# Patient Record
Sex: Female | Born: 1970 | Race: Black or African American | Hispanic: No | Smoking: Never smoker
Health system: Southern US, Community
[De-identification: ages and names within clinical notes are randomized; demographics above are authoritative.]

## PROBLEM LIST (undated history)

## (undated) DIAGNOSIS — E785 Hyperlipidemia, unspecified: Secondary | ICD-10-CM

## (undated) DIAGNOSIS — K219 Gastro-esophageal reflux disease without esophagitis: Secondary | ICD-10-CM

## (undated) DIAGNOSIS — T7840XA Allergy, unspecified, initial encounter: Secondary | ICD-10-CM

## (undated) DIAGNOSIS — E119 Type 2 diabetes mellitus without complications: Secondary | ICD-10-CM

## (undated) HISTORY — DX: Gastro-esophageal reflux disease without esophagitis: K21.9

## (undated) HISTORY — DX: Type 2 diabetes mellitus without complications: E11.9

## (undated) HISTORY — DX: Hyperlipidemia, unspecified: E78.5

## (undated) HISTORY — DX: Allergy, unspecified, initial encounter: T78.40XA

---

## 2014-05-19 ENCOUNTER — Inpatient Hospital Stay (HOSPITAL_COMMUNITY)
Admission: EM | Admit: 2014-05-19 | Discharge: 2014-05-25 | DRG: 638 | Disposition: A | Discharge status: Home / Self Care | Payer: Self-pay | Attending: Internal Medicine | Admitting: Internal Medicine

## 2014-05-19 ENCOUNTER — Encounter (HOSPITAL_COMMUNITY): Payer: Self-pay | Admitting: Emergency Medicine

## 2014-05-19 DIAGNOSIS — E1310 Other specified diabetes mellitus with ketoacidosis without coma: Principal | ICD-10-CM | POA: Diagnosis present

## 2014-05-19 DIAGNOSIS — Z833 Family history of diabetes mellitus: Secondary | ICD-10-CM

## 2014-05-19 DIAGNOSIS — E1165 Type 2 diabetes mellitus with hyperglycemia: Secondary | ICD-10-CM

## 2014-05-19 DIAGNOSIS — E860 Dehydration: Secondary | ICD-10-CM | POA: Diagnosis present

## 2014-05-19 DIAGNOSIS — A599 Trichomoniasis, unspecified: Secondary | ICD-10-CM | POA: Diagnosis present

## 2014-05-19 DIAGNOSIS — E876 Hypokalemia: Secondary | ICD-10-CM | POA: Diagnosis present

## 2014-05-19 DIAGNOSIS — IMO0002 Reserved for concepts with insufficient information to code with codable children: Secondary | ICD-10-CM

## 2014-05-19 DIAGNOSIS — E1110 Type 2 diabetes mellitus with ketoacidosis without coma: Secondary | ICD-10-CM | POA: Diagnosis present

## 2014-05-19 DIAGNOSIS — E871 Hypo-osmolality and hyponatremia: Secondary | ICD-10-CM | POA: Diagnosis present

## 2014-05-19 DIAGNOSIS — E785 Hyperlipidemia, unspecified: Secondary | ICD-10-CM | POA: Diagnosis present

## 2014-05-19 DIAGNOSIS — E0810 Diabetes mellitus due to underlying condition with ketoacidosis without coma: Secondary | ICD-10-CM

## 2014-05-19 DIAGNOSIS — Z794 Long term (current) use of insulin: Secondary | ICD-10-CM

## 2014-05-19 DIAGNOSIS — N760 Acute vaginitis: Secondary | ICD-10-CM | POA: Diagnosis present

## 2014-05-19 DIAGNOSIS — E119 Type 2 diabetes mellitus without complications: Secondary | ICD-10-CM

## 2014-05-19 LAB — BASIC METABOLIC PANEL
ANION GAP: 20 — AB (ref 5–15)
Anion gap: 28 — ABNORMAL HIGH (ref 5–15)
Anion gap: 25 — ABNORMAL HIGH (ref 5–15)
BUN: 17 mg/dL (ref 6–23)
BUN: 13 mg/dL (ref 6–23)
BUN: 11 mg/dL (ref 6–23)
CALCIUM: 8 mg/dL — AB (ref 8.4–10.5)
CALCIUM: 8.2 mg/dL — AB (ref 8.4–10.5)
CO2: 10 mEq/L — CL (ref 19–32)
CO2: 11 mEq/L — ABNORMAL LOW (ref 19–32)
CO2: 13 mEq/L — ABNORMAL LOW (ref 19–32)
Calcium: 9 mg/dL (ref 8.4–10.5)
Chloride: 90 mEq/L — ABNORMAL LOW (ref 96–112)
Chloride: 102 mEq/L (ref 96–112)
Chloride: 103 mEq/L (ref 96–112)
Creatinine, Ser: 0.8 mg/dL (ref 0.50–1.10)
Creatinine, Ser: 0.7 mg/dL (ref 0.50–1.10)
Creatinine, Ser: 0.7 mg/dL (ref 0.50–1.10)
GFR calc Af Amer: >90 mL/min (ref >90)
GFR calc Af Amer: >90 mL/min (ref >90)
GFR calc Af Amer: >90 mL/min (ref >90)
GFR calc non Af Amer: 90 mL/min — ABNORMAL LOW (ref >90)
GFR calc non Af Amer: >90 mL/min (ref >90)
GLUCOSE: 253 mg/dL — AB (ref 70–99)
GLUCOSE: 163 mg/dL — AB (ref 70–99)
Glucose, Bld: 444 mg/dL — ABNORMAL HIGH (ref 70–99)
POTASSIUM: 2.6 meq/L — AB (ref 3.7–5.3)
POTASSIUM: 2.8 meq/L — AB (ref 3.7–5.3)
Potassium: 2.9 mEq/L — CL (ref 3.7–5.3)
SODIUM: 138 meq/L (ref 137–147)
SODIUM: 136 meq/L — AB (ref 137–147)
Sodium: 128 mEq/L — ABNORMAL LOW (ref 137–147)

## 2014-05-19 LAB — WET PREP, GENITAL
Clue Cells Wet Prep HPF POC: NONE SEEN
Yeast Wet Prep HPF POC: NONE SEEN

## 2014-05-19 LAB — URINALYSIS, ROUTINE W REFLEX MICROSCOPIC
BILIRUBIN URINE: NEGATIVE
Ketones, ur: >80 mg/dL — AB
Leukocytes, UA: NEGATIVE
Nitrite: NEGATIVE
PH: 5.5 (ref 5.0–8.0)
Protein, ur: 30 mg/dL — AB
SPECIFIC GRAVITY, URINE: 1.024 (ref 1.005–1.030)
Urobilinogen, UA: 0.2 mg/dL (ref 0.0–1.0)

## 2014-05-19 LAB — URINE MICROSCOPIC-ADD ON

## 2014-05-19 LAB — CBC WITH DIFFERENTIAL/PLATELET
Basophils Absolute: 0 10*3/uL (ref 0.0–0.1)
Basophils Relative: 0 % (ref 0–1)
Eosinophils Absolute: 0 10*3/uL (ref 0.0–0.7)
Eosinophils Relative: 0 % (ref 0–5)
HCT: 37.7 % (ref 36.0–46.0)
Hemoglobin: 13.1 g/dL (ref 12.0–15.0)
Lymphocytes Relative: 27 % (ref 12–46)
Lymphs Abs: 2.3 10*3/uL (ref 0.7–4.0)
MCH: 27.6 pg (ref 26.0–34.0)
MCHC: 34.7 g/dL (ref 30.0–36.0)
MCV: 79.5 fL (ref 78.0–100.0)
Monocytes Absolute: 0.7 10*3/uL (ref 0.1–1.0)
Monocytes Relative: 8 % (ref 3–12)
Neutro Abs: 5.4 10*3/uL (ref 1.7–7.7)
Neutrophils Relative %: 65 % (ref 43–77)
Platelets: 269 10*3/uL (ref 150–400)
RBC: 4.74 MIL/uL (ref 3.87–5.11)
RDW: 13.4 % (ref 11.5–15.5)
WBC: 8.4 10*3/uL (ref 4.0–10.5)

## 2014-05-19 LAB — CBG MONITORING, ED
GLUCOSE-CAPILLARY: 290 mg/dL — AB (ref 70–99)
GLUCOSE-CAPILLARY: 166 mg/dL — AB (ref 70–99)
GLUCOSE-CAPILLARY: 160 mg/dL — AB (ref 70–99)
Glucose-Capillary: 399 mg/dL — ABNORMAL HIGH (ref 70–99)
Glucose-Capillary: 380 mg/dL — ABNORMAL HIGH (ref 70–99)
Glucose-Capillary: 365 mg/dL — ABNORMAL HIGH (ref 70–99)
Glucose-Capillary: 296 mg/dL — ABNORMAL HIGH (ref 70–99)
Glucose-Capillary: 253 mg/dL — ABNORMAL HIGH (ref 70–99)
Glucose-Capillary: 147 mg/dL — ABNORMAL HIGH (ref 70–99)

## 2014-05-19 LAB — I-STAT TROPONIN, ED: Troponin i, poc: 0 ng/mL (ref 0.00–0.08)

## 2014-05-19 LAB — GLUCOSE, CAPILLARY
GLUCOSE-CAPILLARY: 143 mg/dL — AB (ref 70–99)
Glucose-Capillary: 166 mg/dL — ABNORMAL HIGH (ref 70–99)

## 2014-05-19 LAB — POC URINE PREG, ED: Preg Test, Ur: NEGATIVE

## 2014-05-19 LAB — I-STAT BETA HCG BLOOD, ED (MC, WL, AP ONLY)

## 2014-05-19 MED ORDER — ACETAMINOPHEN 650 MG RE SUPP: 650.0000 mg | Freq: Four times a day (QID) | RECTAL | Status: DC | PRN

## 2014-05-19 MED ORDER — ONDANSETRON HCL 4 MG PO TABS: 4.0000 mg | ORAL_TABLET | Freq: Four times a day (QID) | ORAL | Status: DC | PRN

## 2014-05-19 MED ORDER — SODIUM CHLORIDE 0.9 % IV SOLN
INTRAVENOUS | Status: DC
  Administered 2014-05-19: 3.1 [IU]/h via INTRAVENOUS
  Filled 2014-05-19: qty 2.5

## 2014-05-19 MED ORDER — POTASSIUM CHLORIDE 10 MEQ/100ML IV SOLN
10.0000 meq | INTRAVENOUS | Status: AC
  Administered 2014-05-19 – 2014-05-20 (×4): 10 meq via INTRAVENOUS
  Filled 2014-05-19 (×4): qty 100

## 2014-05-19 MED ORDER — SODIUM CHLORIDE 0.9 % IV SOLN
INTRAVENOUS | Status: DC
  Administered 2014-05-19: 17:00:00 via INTRAVENOUS

## 2014-05-19 MED ORDER — ACETAMINOPHEN 325 MG PO TABS
650.0000 mg | ORAL_TABLET | Freq: Four times a day (QID) | ORAL | Status: DC | PRN
  Filled 2014-05-19: qty 2

## 2014-05-19 MED ORDER — INSULIN REGULAR HUMAN 100 UNIT/ML IJ SOLN
INTRAMUSCULAR | Status: DC
  Administered 2014-05-19: 4.7 [IU]/h via INTRAVENOUS
  Administered 2014-05-20: 2.1 [IU]/h via INTRAVENOUS
  Administered 2014-05-20: 3.9 [IU]/h via INTRAVENOUS
  Administered 2014-05-20: 1.5 [IU]/h via INTRAVENOUS
  Administered 2014-05-20: 1.9 [IU]/h via INTRAVENOUS
  Administered 2014-05-20: 3.9 [IU]/h via INTRAVENOUS
  Administered 2014-05-20: 2.4 [IU]/h via INTRAVENOUS
  Administered 2014-05-20: 0.4 [IU]/h via INTRAVENOUS
  Administered 2014-05-20: 3.2 [IU]/h via INTRAVENOUS
  Administered 2014-05-20: 1.7 [IU]/h via INTRAVENOUS
  Filled 2014-05-19: qty 2.5

## 2014-05-19 MED ORDER — POTASSIUM CHLORIDE CRYS ER 20 MEQ PO TBCR
60.0000 meq | EXTENDED_RELEASE_TABLET | ORAL | Status: AC
  Administered 2014-05-19 (×2): 60 meq via ORAL
  Filled 2014-05-19 (×2): qty 3

## 2014-05-19 MED ORDER — POTASSIUM CHLORIDE 10 MEQ/100ML IV SOLN
10.0000 meq | INTRAVENOUS | Status: AC
  Administered 2014-05-19 (×2): 10 meq via INTRAVENOUS
  Filled 2014-05-19 (×3): qty 100

## 2014-05-19 MED ORDER — ENOXAPARIN SODIUM 40 MG/0.4ML ~~LOC~~ SOLN
40.0000 mg | SUBCUTANEOUS | Status: DC
  Administered 2014-05-22 – 2014-05-25 (×4): 40 mg via SUBCUTANEOUS
  Filled 2014-05-19 (×7): qty 0.4

## 2014-05-19 MED ORDER — DEXTROSE 50 % IV SOLN: 25.0000 mL | INTRAVENOUS | Status: DC | PRN

## 2014-05-19 MED ORDER — SODIUM CHLORIDE 0.9 % IV BOLUS (SEPSIS)
1000.0000 mL | Freq: Once | INTRAVENOUS | Status: AC
  Administered 2014-05-19: 1000 mL via INTRAVENOUS

## 2014-05-19 MED ORDER — ONDANSETRON HCL 4 MG/2ML IJ SOLN: 4.0000 mg | Freq: Four times a day (QID) | INTRAMUSCULAR | Status: DC | PRN

## 2014-05-19 MED ORDER — SODIUM CHLORIDE 0.9 % IV SOLN: INTRAVENOUS | Status: DC

## 2014-05-19 MED ORDER — SODIUM CHLORIDE 0.9 % IV SOLN: INTRAVENOUS | Status: AC

## 2014-05-19 MED ORDER — METRONIDAZOLE 500 MG PO TABS
2000.0000 mg | ORAL_TABLET | Freq: Once | ORAL | Status: DC
  Filled 2014-05-19: qty 4

## 2014-05-19 MED ORDER — DEXTROSE-NACL 5-0.45 % IV SOLN
INTRAVENOUS | Status: DC
  Administered 2014-05-19 – 2014-05-21 (×3): via INTRAVENOUS

## 2014-05-19 MED ORDER — DEXTROSE-NACL 5-0.45 % IV SOLN: INTRAVENOUS | Status: DC

## 2014-05-19 MED ORDER — PANTOPRAZOLE SODIUM 40 MG IV SOLR
40.0000 mg | INTRAVENOUS | Status: DC
  Administered 2014-05-19 – 2014-05-20 (×2): 40 mg via INTRAVENOUS
  Filled 2014-05-19 (×3): qty 40

## 2014-05-19 NOTE — ED Notes (Signed)
Received call from Barbaraann Boys RN, ready for patient at this time.

## 2014-05-19 NOTE — ED Provider Notes (Signed)
Medical screening examination/treatment/procedure(s) were conducted as a shared visit with non-physician practitioner(s) and myself.  I personally evaluated the patient during the encounter.   EKG Interpretation   Date/Time:  Wednesday May 19 2014 12:12:31 EDT Ventricular Rate:  85 PR Interval:  172 QRS Duration: 77 QT Interval:  355 QTC Calculation: 422 R Axis:   35 Text Interpretation:  Sinus rhythm t wave inversion in Lead III No  previous ECGs available Confirmed by Christy Gentles  MD, Elenore Rota ([id number]) on  05/19/2014 12:26:50 PM      CRITICAL CARE Performed by: Sharyon Cable Total critical care time: 32 Critical care time was exclusive of separately billable procedures and treating other patients. Critical care was necessary to treat or prevent imminent or life-threatening deterioration. Critical care was time spent personally by me on the following activities: development of treatment plan with patient and/or surrogate as well as nursing, discussions with consultants, evaluation of patient's response to treatment, examination of patient, obtaining history from patient or surrogate, ordering and performing treatments and interventions, ordering and review of laboratory studies, ordering and review of radiographic studies, pulse oximetry and re-evaluation of patient's condition.  Pt found to be in DKA Anion gap >28 Pt smells ketotic  IV insulin ordered Pt mildly hypotensive but this improved with IV fluids  Sharyon Cable, MD 05/19/14 [phone]

## 2014-05-19 NOTE — ED Notes (Signed)
Dr. Broadus John Paged regarding critical results of Potassium 2.6.

## 2014-05-19 NOTE — ED Notes (Addendum)
Critical Potassium - 2.8 from lab

## 2014-05-19 NOTE — ED Notes (Signed)
Pt reports frequent urination, excessive thirst and vaginal irritation. No acute distress noted.

## 2014-05-19 NOTE — Progress Notes (Signed)
  CARE MANAGEMENT ED NOTE 05/19/2014  Patient:  Krista Campbell, Krista Campbell   Account Number:  [id number]  Date Initiated:  05/19/2014  Documentation initiated by:  Edwyna Shell  Subjective/Objective Assessment:   43 yo female presenting to the ED with c/o frequent urination     Subjective/Objective Assessment Detail:     Action/Plan:   Patient has follow up appointment on Oct. 9th, Friday at 9:00am   Action/Plan Detail:   Anticipated DC Date:       Status Recommendation to Physician:   Result of Recommendation:  Agreed    [place]  CM consult  PCP issues  Follow-up appt scheduled    Choice offered to / List presented to:            Status of service:  Completed, signed off  ED Comments:   ED Comments Detail:  This CM was consulted to assist with PCP needs. This CM spoke with the patient and she stated that she does not have insurance and has not had a PCP "in a long time". She stated that she currently is not on any medications. This Cm spoke with the patient regarding the ED PA's concern regarding new onset DM. This CM spoke with the patient regarding the importance of follow up care for mediation prescription and follow up medical care for the DM, as medications may have to be adjusted initially. The patient verbalized understanding. This CM then contacted the [facility] and spoke with [facility] and was able to establish an appointment this Friday, October 9th at [place] information was communicated to the [place], staff nurse and the patient. The patient was informed to arrive at 8:15 for new patient check in. The patient verbalized understanding and was provided with a [facility] pamphlet with the appointment information written on it. The patient had no other questions or concerns.

## 2014-05-19 NOTE — ED Provider Notes (Signed)
Patient seen/examined in the Emergency Department in conjunction with Midlevel Provider [facility] Patient reports polyuria, found to be in new onset DKA Exam : awake/alert, smells ketotic and appears dehydrated Plan: admit for DKA   Sharyon Cable, MD 05/19/14 1354

## 2014-05-19 NOTE — ED Notes (Signed)
Patient declining antibiotics in pill form, EDPA made aware.

## 2014-05-19 NOTE — H&P (Signed)
[facility] History and Physical  Krista Campbell:[id number] DOB: [birth year] DOA: 05/19/2014  Referring physician: EDP PCP: No PCP Per Patient   Chief Complaint: polyuria, polydipsia  HPI: Krista Campbell is a 43 y.o. female with no significant PMH presents to the ER with the above complaints. She was in her usual state of health till about 2 weeks ago when she noticed, polyuria, polydipsia, and some polyphagia too. In addition also noticed heartburn and some vaginal discharge which she attributed to polyuria and toilet paper. In ER noted to have Hyperglycemia, no H/o DM, anion gap metabolic acidosis, hypokalemia  Review of Systems: positives bolded Constitutional:  No weight loss, night sweats, Fevers, chills, fatigue.  HEENT:  No headaches, Difficulty swallowing,Tooth/dental problems,Sore throat,  No sneezing, itching, ear ache, nasal congestion, post nasal drip,  Cardio-vascular:  No chest pain, Orthopnea, PND, swelling in lower extremities, anasarca, dizziness, palpitations  GI:   heartburn, indigestion, abdominal pain, nausea, vomiting, diarrhea, change in bowel habits, loss of appetite  Resp:  No shortness of breath with exertion or at rest. No excess mucus, no productive cough, No non-productive cough, No coughing up of blood.No change in color of mucus.No wheezing.No chest wall deformity  Skin:  no rash or lesions.  GU:  no dysuria, change in color of urine, no urgency or frequency. No flank pain.  Musculoskeletal:  No joint pain or swelling. No decreased range of motion. No back pain.  Psych:  No change in mood or affect. No depression or anxiety. No memory loss.   History reviewed. No pertinent past medical history. History reviewed. No pertinent past surgical history. Social History:  reports that she does not drink alcohol or use illicit drugs. Her tobacco history is not on file.  No Known Allergies  History reviewed. No pertinent family history.     Prior to Admission medications   Medication Sig Start Date End Date Taking? Authorizing Provider  hydrocortisone cream 0.5 % Apply 1 application topically as needed for itching.   Yes Historical Provider, MD   Physical Exam: Filed Vitals:   05/19/14 1345 05/19/14 1430 05/19/14 1513 05/19/14 1520  BP: 115/67 119/57 95/56 125/70  Pulse: 87 87 91   Temp:      TempSrc:      Resp: 19 27 22    SpO2: 100% 100% 100% 100%    Wt Readings from Last 3 Encounters:  No data found for Wt    General:  Appears calm and comfortable, no distress Eyes: PERRL, normal lids, irises & conjunctiva ENT: grossly normal lips & tongue Neck: no LAD, masses or thyromegaly Cardiovascular: RRR, no m/r/g. No LE edema. Respiratory: CTA bilaterally, no w/r/r. Normal respiratory effort. Abdomen: soft, nt, nd, BS present Skin: no rash or induration seen on limited exam Musculoskeletal: grossly normal tone BUE/BLE Psychiatric: grossly normal mood and affect, speech fluent and appropriate Neurologic: grossly non-focal.          Labs on Admission:  Basic Metabolic Panel:  Recent Labs Lab 05/19/14 1155  NA 128*  K 2.9*  CL 90*  CO2 10*  GLUCOSE 444*  BUN 17  CREATININE 0.80  CALCIUM 9.0   Liver Function Tests: No results found for this basename: AST, ALT, ALKPHOS, BILITOT, PROT, ALBUMIN,  in the last 168 hours No results found for this basename: LIPASE, AMYLASE,  in the last 168 hours No results found for this basename: AMMONIA,  in the last 168 hours CBC:  Recent Labs Lab 05/19/14 1155  WBC  8.4  NEUTROABS 5.4  HGB 13.1  HCT 37.7  MCV 79.5  PLT 269   Cardiac Enzymes: No results found for this basename: CKTOTAL, CKMB, CKMBINDEX, TROPONINI,  in the last 168 hours  BNP (last 3 results) No results found for this basename: PROBNP,  in the last 8760 hours CBG:  Recent Labs Lab 05/19/14 0944 05/19/14 1324 05/19/14 1418  GLUCAP 399* 380* 365*    Radiological Exams on Admission: No  results found.  EKG: Independently reviewed. NSR, TWI in lead 3  Assessment/Plan    DKA (diabetic ketoacidoses) -new DM -IVF, IV insulin per Glucomander -Bmet Q4 -transition to Insulin 70/30 when gap closes   New DM -check hbaic, as above -DM coordinator consult, Dietician consult   Hyponatremia -due to dehydration and pseudohyponatremia from hyperglycemia    Hypokalemia -replace   Trichomoniasis vaginitis - given metronidazole x1, unfortunately didn't get a chance to discuss this with pt as these results just seen after admission, sexual partner will need Rx too  Code Status: Full Code DVT Prophylaxis: lovenox Family Communication: none at bedside Disposition Plan: inpatient   Time spent: 67min  Drewey Begue [facility] Pager [phone]

## 2014-05-19 NOTE — ED Notes (Signed)
Care Management RN at bedside.

## 2014-05-19 NOTE — ED Notes (Signed)
Paged Dr. Broadus John regarding 2 consecutive CBG's below 250, states to continue IV insulin until anion gap closes.

## 2014-05-19 NOTE — ED Provider Notes (Signed)
CSN: [id number]     Arrival date & time 05/19/14  0935 History   First MD Initiated Contact with Patient 05/19/14 1117     Chief Complaint  Patient presents with  . Urinary Frequency  . Vaginal Discharge     (Consider location/radiation/quality/duration/timing/severity/associated sxs/prior Treatment) Patient is a 43 y.o. female presenting with frequency. The history is provided by the patient. No language interpreter was used.  Urinary Frequency This is a new problem. The current episode started 1 to 4 weeks ago. The problem occurs constantly. The problem has been gradually worsening. Associated symptoms include fatigue. Pertinent negatives include no chest pain, chills, coughing or myalgias. Nothing aggravates the symptoms. She has tried rest and drinking for the symptoms. The treatment provided no relief.   Pt is a 42yo female presenting to ED with c/o 2 week hx of gradually worsening generalized fatigue, polyuria and polydipsia. Pt also reports vaginal irritation, states she believes it is due to the toilet paper from having to urinate so frequently. Denies chest pain, SOB, abdominal pain, n/v/d. Pt reports not f/u with a PCP in "many years." denies known hx of CAD, DM, or other significant PMH. Denies family hx of DM or CAD.   History reviewed. No pertinent past medical history. History reviewed. No pertinent past surgical history. History reviewed. No pertinent family history. History  Substance Use Topics  . Smoking status: Not on file  . Smokeless tobacco: Not on file  . Alcohol Use: No   OB History   Grav Para Term Preterm Abortions TAB SAB Ect Mult Living                 Review of Systems  Constitutional: Positive for fatigue. Negative for chills.  Respiratory: Negative for cough and shortness of breath.   Cardiovascular: Negative for chest pain, palpitations and leg swelling.  Gastrointestinal: Negative for diarrhea.  Endocrine: Positive for polydipsia and polyuria.   Genitourinary: Positive for frequency and vaginal pain ( "irritation"). Negative for urgency, hematuria, decreased urine volume, vaginal bleeding, genital sores, menstrual problem and pelvic pain.  Musculoskeletal: Negative for back pain and myalgias.  All other systems reviewed and are negative.     Allergies  Review of patient's allergies indicates no known allergies.  Home Medications   Prior to Admission medications   Medication Sig Start Date End Date Taking? Authorizing Provider  hydrocortisone cream 0.5 % Apply 1 application topically as needed for itching.   Yes Historical Provider, MD   BP 115/67  Pulse 87  Temp(Src) 97.6 F (36.4 C) (Oral)  Resp 19  SpO2 100% Physical Exam  Nursing note and vitals reviewed. Constitutional: She is oriented to person, place, and time. She appears well-developed and well-nourished. No distress.  HENT:  Head: Normocephalic and atraumatic.  Eyes: Conjunctivae are normal. No scleral icterus.  Neck: Normal range of motion.  Cardiovascular: Normal rate, regular rhythm and normal heart sounds.   Pulmonary/Chest: Effort normal and breath sounds normal. No respiratory distress. She has no wheezes. She has no rales. She exhibits no tenderness.  Abdominal: Soft. Bowel sounds are normal. She exhibits no distension and no mass. There is no tenderness. There is no rebound and no guarding.  Obese abdomen, soft, non-tender. No rebound guarding or masses  Genitourinary:  Chaperoned exam External genitalia: normal Vaginal: positive discharge-thick, white, positive-scant bleeding Cervix: closed: positive discharge, negative bleeding. Cervical motion absent,  Adnexa palpated: negative adnexal tenderness, negative adnexal mass  Musculoskeletal: Normal range of motion.  Neurological: She  is alert and oriented to person, place, and time.  Alert and oriented. Speech is fluent. Able to follow commands w/o difficulty  Skin: Skin is warm and dry. She is not  diaphoretic.    ED Course  Procedures (including critical care time) Labs Review Labs Reviewed  WET PREP, GENITAL - Abnormal; Notable for the following:    Trich, Wet Prep FEW (*)    WBC, Wet Prep HPF POC FEW (*)    All other components within normal limits  URINALYSIS, ROUTINE W REFLEX MICROSCOPIC - Abnormal; Notable for the following:    APPearance CLOUDY (*)    Glucose, UA >1000 (*)    Hgb urine dipstick SMALL (*)    Ketones, ur >80 (*)    Protein, ur 30 (*)    All other components within normal limits  URINE MICROSCOPIC-ADD ON - Abnormal; Notable for the following:    Casts HYALINE CASTS (*)    All other components within normal limits  BASIC METABOLIC PANEL - Abnormal; Notable for the following:    Sodium 128 (*)    Potassium 2.9 (*)    Chloride 90 (*)    CO2 10 (*)    Glucose, Bld 444 (*)    GFR calc non Af Amer 90 (*)    Anion gap 28 (*)    All other components within normal limits  CBG MONITORING, ED - Abnormal; Notable for the following:    Glucose-Capillary 399 (*)    All other components within normal limits  CBG MONITORING, ED - Abnormal; Notable for the following:    Glucose-Capillary 380 (*)    All other components within normal limits  GC/CHLAMYDIA PROBE AMP  CBC WITH DIFFERENTIAL  POC URINE PREG, ED  POC BETA HCG BLOOD, ED ([facility], [facility], [facility] ONLY)  I-STAT TROPOININ, ED  CBG MONITORING, ED    Imaging Review No results found.   EKG Interpretation   Date/Time:  Wednesday May 19 2014 12:12:31 EDT Ventricular Rate:  85 PR Interval:  172 QRS Duration: 77 QT Interval:  355 QTC Calculation: 422 R Axis:   35 Text Interpretation:  Sinus rhythm t wave inversion in Lead III No  previous ECGs available Confirmed by Christy Gentles  MD, Elenore Rota ([id number]) on  05/19/2014 12:26:50 PM      MDM   Final diagnoses:  Diabetic ketoacidosis without coma associated with type 2 diabetes mellitus  Trichomoniasis    Pt is a 43yo female presenting to ED in DKA with c/o  polyuria, polydipsia, and generalized fatigue x2 weeks. Pt does not have a PCP, has not had a PCP for "many years"  Pt is alert and oriented. Appears mildly fatigued but otherwise, non-toxic appearing.   Pt given 2L IV fluids in ED, started on glucostabilizer.   Consulted with Dr. Christy Gentles, agrees pt is to be admitted for additional tx of DKA and new onset diabetes.  Pt found to have few trich on her wet prep. Due to being symptomatic with reported vaginal irritation, offered pt 2g PO flagyl for tx, pt declined stating she cannot take pills.   1:52PM consulted with Dr. Broadus John who agreed to admit pt for DKA. Pt is stable at this time.  Noland Fordyce, PA-C 05/19/14 1424

## 2014-05-19 NOTE — ED Notes (Signed)
Contacted lab regarding delay. EDPA and patient updated.

## 2014-05-20 ENCOUNTER — Encounter (HOSPITAL_COMMUNITY): Payer: Self-pay

## 2014-05-20 DIAGNOSIS — A599 Trichomoniasis, unspecified: Secondary | ICD-10-CM

## 2014-05-20 LAB — URINALYSIS, ROUTINE W REFLEX MICROSCOPIC
Bilirubin Urine: NEGATIVE
GLUCOSE, UA: NEGATIVE mg/dL
Ketones, ur: 15 mg/dL — AB
Nitrite: NEGATIVE
PH: 5.5 (ref 5.0–8.0)
Protein, ur: NEGATIVE mg/dL
SPECIFIC GRAVITY, URINE: 1.01 (ref 1.005–1.030)
Urobilinogen, UA: 0.2 mg/dL (ref 0.0–1.0)

## 2014-05-20 LAB — COMPREHENSIVE METABOLIC PANEL
ALBUMIN: 3.5 g/dL (ref 3.5–5.2)
ALK PHOS: 62 U/L (ref 39–117)
ALT: 12 U/L (ref 0–35)
ANION GAP: 14 (ref 5–15)
AST: 14 U/L (ref 0–37)
BILIRUBIN TOTAL: 1.1 mg/dL (ref 0.3–1.2)
BUN: 10 mg/dL (ref 6–23)
CHLORIDE: 110 meq/L (ref 96–112)
CO2: 15 mEq/L — ABNORMAL LOW (ref 19–32)
Calcium: 8.4 mg/dL (ref 8.4–10.5)
Creatinine, Ser: 0.74 mg/dL (ref 0.50–1.10)
GFR calc Af Amer: >90 mL/min (ref >90)
GFR calc non Af Amer: >90 mL/min (ref >90)
Glucose, Bld: 162 mg/dL — ABNORMAL HIGH (ref 70–99)
POTASSIUM: 4 meq/L (ref 3.7–5.3)
Sodium: 139 mEq/L (ref 137–147)
Total Protein: 7.4 g/dL (ref 6.0–8.3)

## 2014-05-20 LAB — BASIC METABOLIC PANEL
ANION GAP: 13 (ref 5–15)
ANION GAP: 15 (ref 5–15)
Anion gap: 14 (ref 5–15)
Anion gap: 20 — ABNORMAL HIGH (ref 5–15)
BUN: 10 mg/dL (ref 6–23)
BUN: 7 mg/dL (ref 6–23)
BUN: 8 mg/dL (ref 6–23)
BUN: 9 mg/dL (ref 6–23)
CALCIUM: 8.8 mg/dL (ref 8.4–10.5)
CALCIUM: 8.4 mg/dL (ref 8.4–10.5)
CHLORIDE: 107 meq/L (ref 96–112)
CO2: 14 mEq/L — ABNORMAL LOW (ref 19–32)
CO2: 16 mEq/L — ABNORMAL LOW (ref 19–32)
CO2: 16 meq/L — AB (ref 19–32)
CO2: 16 meq/L — AB (ref 19–32)
CREATININE: 0.74 mg/dL (ref 0.50–1.10)
CREATININE: 0.71 mg/dL (ref 0.50–1.10)
CREATININE: 0.67 mg/dL (ref 0.50–1.10)
Calcium: 8.5 mg/dL (ref 8.4–10.5)
Calcium: 8.8 mg/dL (ref 8.4–10.5)
Chloride: 105 mEq/L (ref 96–112)
Chloride: 109 mEq/L (ref 96–112)
Chloride: 110 mEq/L (ref 96–112)
Creatinine, Ser: 0.71 mg/dL (ref 0.50–1.10)
GFR calc Af Amer: >90 mL/min (ref >90)
GFR calc Af Amer: >90 mL/min (ref >90)
GFR calc Af Amer: >90 mL/min (ref >90)
GFR calc non Af Amer: >90 mL/min (ref >90)
GFR calc non Af Amer: >90 mL/min (ref >90)
GLUCOSE: 151 mg/dL — AB (ref 70–99)
GLUCOSE: 128 mg/dL — AB (ref 70–99)
Glucose, Bld: 147 mg/dL — ABNORMAL HIGH (ref 70–99)
Glucose, Bld: 110 mg/dL — ABNORMAL HIGH (ref 70–99)
POTASSIUM: 3.5 meq/L — AB (ref 3.7–5.3)
Potassium: 3.5 mEq/L — ABNORMAL LOW (ref 3.7–5.3)
Potassium: 3.1 mEq/L — ABNORMAL LOW (ref 3.7–5.3)
Potassium: 3.5 mEq/L — ABNORMAL LOW (ref 3.7–5.3)
SODIUM: 143 meq/L (ref 137–147)
Sodium: 139 mEq/L (ref 137–147)
Sodium: 137 mEq/L (ref 137–147)
Sodium: 136 mEq/L — ABNORMAL LOW (ref 137–147)

## 2014-05-20 LAB — URINE MICROSCOPIC-ADD ON

## 2014-05-20 LAB — GLUCOSE, CAPILLARY
GLUCOSE-CAPILLARY: 123 mg/dL — AB (ref 70–99)
GLUCOSE-CAPILLARY: 145 mg/dL — AB (ref 70–99)
GLUCOSE-CAPILLARY: 150 mg/dL — AB (ref 70–99)
GLUCOSE-CAPILLARY: 154 mg/dL — AB (ref 70–99)
GLUCOSE-CAPILLARY: 189 mg/dL — AB (ref 70–99)
GLUCOSE-CAPILLARY: 212 mg/dL — AB (ref 70–99)
Glucose-Capillary: 102 mg/dL — ABNORMAL HIGH (ref 70–99)
Glucose-Capillary: 132 mg/dL — ABNORMAL HIGH (ref 70–99)
Glucose-Capillary: 136 mg/dL — ABNORMAL HIGH (ref 70–99)
Glucose-Capillary: 137 mg/dL — ABNORMAL HIGH (ref 70–99)
Glucose-Capillary: 137 mg/dL — ABNORMAL HIGH (ref 70–99)
Glucose-Capillary: 145 mg/dL — ABNORMAL HIGH (ref 70–99)
Glucose-Capillary: 146 mg/dL — ABNORMAL HIGH (ref 70–99)
Glucose-Capillary: 149 mg/dL — ABNORMAL HIGH (ref 70–99)
Glucose-Capillary: 163 mg/dL — ABNORMAL HIGH (ref 70–99)
Glucose-Capillary: 164 mg/dL — ABNORMAL HIGH (ref 70–99)
Glucose-Capillary: 166 mg/dL — ABNORMAL HIGH (ref 70–99)
Glucose-Capillary: 166 mg/dL — ABNORMAL HIGH (ref 70–99)
Glucose-Capillary: 169 mg/dL — ABNORMAL HIGH (ref 70–99)
Glucose-Capillary: 174 mg/dL — ABNORMAL HIGH (ref 70–99)
Glucose-Capillary: 178 mg/dL — ABNORMAL HIGH (ref 70–99)
Glucose-Capillary: 190 mg/dL — ABNORMAL HIGH (ref 70–99)
Glucose-Capillary: 191 mg/dL — ABNORMAL HIGH (ref 70–99)
Glucose-Capillary: 210 mg/dL — ABNORMAL HIGH (ref 70–99)

## 2014-05-20 LAB — LIPID PANEL
CHOL/HDL RATIO: 4.3 ratio
Cholesterol: 191 mg/dL (ref 0–200)
HDL: 44 mg/dL (ref >39)
LDL Cholesterol: 124 mg/dL — ABNORMAL HIGH (ref 0–99)
TRIGLYCERIDES: 116 mg/dL (ref <150)
VLDL: 23 mg/dL (ref 0–40)

## 2014-05-20 LAB — HEMOGLOBIN A1C
HEMOGLOBIN A1C: 13.6 % — AB (ref <5.7)
Mean Plasma Glucose: 344 mg/dL — ABNORMAL HIGH (ref <117)

## 2014-05-20 LAB — CBC
HCT: 32.3 % — ABNORMAL LOW (ref 36.0–46.0)
Hemoglobin: 11.6 g/dL — ABNORMAL LOW (ref 12.0–15.0)
MCH: 27.8 pg (ref 26.0–34.0)
MCHC: 35.9 g/dL (ref 30.0–36.0)
MCV: 77.3 fL — ABNORMAL LOW (ref 78.0–100.0)
PLATELETS: 251 10*3/uL (ref 150–400)
RBC: 4.18 MIL/uL (ref 3.87–5.11)
RDW: 13.3 % (ref 11.5–15.5)
WBC: 7.9 10*3/uL (ref 4.0–10.5)

## 2014-05-20 LAB — MRSA PCR SCREENING: MRSA by PCR: NEGATIVE

## 2014-05-20 LAB — MAGNESIUM: Magnesium: 1.4 mg/dL — ABNORMAL LOW (ref 1.5–2.5)

## 2014-05-20 LAB — GC/CHLAMYDIA PROBE AMP
CT Probe RNA: NEGATIVE
GC Probe RNA: NEGATIVE

## 2014-05-20 MED ORDER — STERILE WATER FOR INJECTION IV SOLN
Freq: Once | INTRAVENOUS | Status: AC
  Administered 2014-05-20: 19:00:00 via INTRAVENOUS
  Filled 2014-05-20: qty 850

## 2014-05-20 MED ORDER — LIVING WELL WITH DIABETES BOOK
Freq: Once | Status: AC
Start: 2014-05-20 — End: 2014-05-20
  Administered 2014-05-20: 1
  Filled 2014-05-20: qty 1

## 2014-05-20 MED ORDER — SODIUM CHLORIDE 0.9 % IV SOLN: Freq: Once | INTRAVENOUS | Status: DC

## 2014-05-20 MED ORDER — POTASSIUM CHLORIDE CRYS ER 20 MEQ PO TBCR
40.0000 meq | EXTENDED_RELEASE_TABLET | Freq: Once | ORAL | Status: AC
  Administered 2014-05-20: 40 meq via ORAL
  Filled 2014-05-20: qty 2

## 2014-05-20 NOTE — Progress Notes (Signed)
Spoke with patient about her new onset diabetes. States that her father had DM and renal failure.  Has been having polydipsia, polyuria, probably some weight loss for the past 2+weeks.  Very willing to watch videos [phone].  Living Well with Diabetes ordered. Dietician has seen patient. Spoke with Staff RN about education.  Will need to wait to see if patient will be discharged on insulin or oral meds. CM working on connection and appointment with [facility]. Will continue to follow while in hospital.  Harvel Ricks RN BSN CDE

## 2014-05-20 NOTE — Progress Notes (Signed)
Utilization review completed.  

## 2014-05-20 NOTE — Plan of Care (Signed)
Problem: Food- and Nutrition-Related Knowledge Deficit (NB-1.1) Goal: Nutrition education Formal process to instruct or train a patient/client in a skill or to impart knowledge to help patients/clients voluntarily manage or modify food choices and eating behavior to maintain or improve health.  RD consulted for nutrition education regarding diabetes. Patient is sleepy, thirsty, and hungry. Unable to focus on diet education very well at this time. Would benefit from OP diet education.    No results found for this basename: HGBA1C    RD provided "Carbohydrate Counting for People with Diabetes" handout from the Academy of Nutrition and Dietetics. Discussed different food groups and their effects on blood sugar, emphasizing carbohydrate-containing foods. Provided list of carbohydrates and recommended serving sizes of common foods.  Discussed importance of controlled and consistent carbohydrate intake throughout the day. Provided examples of ways to balance meals/snacks and encouraged intake of high-fiber, whole grain complex carbohydrates. Teach back method used.  Expect good compliance.  Body mass index is 34.01 kg/(m^2). Pt meets criteria for obesity, class 1, based on current BMI.  Current diet order is NPO. Labs and medications reviewed. No further nutrition interventions warranted at this time. RD contact information provided. If additional nutrition issues arise, please re-consult RD.  Molli Barrows, RD, LDN, [place] Pager [phone] After Hours Pager [phone]

## 2014-05-20 NOTE — Progress Notes (Signed)
[facility] TEAM 1 - Stepdown/ICU TEAM Progress Note  CAITLAN CHAUCA ZDG:[id number] DOB: [birth year] DOA: 05/19/2014 PCP: No PCP Per Patient  Admit HPI / Brief Narrative: Krista Campbell is a 43 y.o.BF no significant PMHx  Presented to the ER with polyuria, polydipsia. She was in her usual state of health till about 2 weeks ago when she noticed, polyuria, polydipsia, and some polyphagia too.  In addition also noticed heartburn and some vaginal discharge which she attributed to polyuria and toilet paper.  In ER noted to have Hyperglycemia, no H/o DM, anion gap metabolic acidosis, hypokalemia   HPI/Subjective: 10/8 states had not been obtaining yearly physical therefore unsure when diabetes started. States has family history diabetes (father)  Assessment/Plan: DKA (diabetic ketoacidoses)  -new DM  -Continue glucose stabilizer and IV fluids, normal saline 162ml/hr. Will allow patient PO WATER   -Patient will remain on glucose stabilizer until anion gap normalized. Current AG= 14  -Sodium bicarbonate 150 mEq x1 -transition to Insulin 70/30 when gap closes  -Obtain lipid panel  New DM  -10/7 hemoglobin A1c = 13.6   -Patient hasn't been seen by DM coordinator consult, Dietician consult   Hyponatremia  -Resolved    Hypokalemia  -K-Dur 40 mEq x1  -Check magnesium   Trichomoniasis vaginitis  - given metronidazole 2 g mx1,  -Obtain GC, and HIV - unfortunately didn't get a chance to discuss this with pt as these results just seen after admission, sexual partner will need Rx too    Code Status: FULL Family Communication: no family present at time of exam Disposition Plan: Resolution of DKA    Consultants: NA  Procedure/Significant Events: NA   Culture 10/8 HIV pending 10/8 GC pending   Antibiotics: 10/7 metronidazole x1 dose  DVT prophylaxis: Lovenox   Devices NA   LINES / TUBES:      Continuous Infusions: . sodium chloride    . sodium chloride  Stopped (05/19/14 1848)  . dextrose 5 % and 0.45% NaCl 75 mL/hr at 05/20/14 0803  . insulin (NOVOLIN-R) infusion 3.9 Units/hr (05/20/14 1347)    Objective: VITAL SIGNS: Temp: 98.3 F (36.8 C) (10/08 1135) Temp Source: Oral (10/08 1135) BP: 90/57 mmHg (10/08 1135) Pulse Rate: 84 (10/08 1135) SPO2; FIO2:   Intake/Output Summary (Last 24 hours) at 05/20/14 1413 Last data filed at 05/20/14 1200  Gross per 24 hour  Intake 2757.3 ml  Output   1400 ml  Net 1357.3 ml     Exam: General: A./O. x4, NAD, No acute respiratory distress Lungs: Clear to auscultation bilaterally without wheezes or crackles Cardiovascular: Regular rate and rhythm without murmur gallop or rub normal S1 and S2 Abdomen: Nontender, nondistended, soft, bowel sounds positive, no rebound, no ascites, no appreciable mass Extremities: No significant cyanosis, clubbing, or edema bilateral lower extremities  Data Reviewed: Basic Metabolic Panel:  Recent Labs Lab 05/19/14 2017 05/19/14 2355 05/20/14 0240 05/20/14 0735 05/20/14 1130  NA 136* 143 139 139 137  K 2.8* 3.5* 4.0 3.5* 3.1*  CL 103 109 110 110 107  CO2 13* 14* 15* 16* 16*  GLUCOSE 163* 151* 162* 128* 147*  BUN 11 10 10 9 8   CREATININE 0.70 0.71 0.74 0.74 0.71  CALCIUM 8.2* 8.5 8.4 8.8 8.8   Liver Function Tests:  Recent Labs Lab 05/20/14 0240  AST 14  ALT 12  ALKPHOS 62  BILITOT 1.1  PROT 7.4  ALBUMIN 3.5   No results found for this basename: LIPASE, AMYLASE,  in the  last 168 hours No results found for this basename: AMMONIA,  in the last 168 hours CBC:  Recent Labs Lab 05/19/14 1155 05/20/14 0240  WBC 8.4 7.9  NEUTROABS 5.4  --   HGB 13.1 11.6*  HCT 37.7 32.3*  MCV 79.5 77.3*  PLT 269 251   Cardiac Enzymes: No results found for this basename: CKTOTAL, CKMB, CKMBINDEX, TROPONINI,  in the last 168 hours BNP (last 3 results) No results found for this basename: PROBNP,  in the last 8760 hours CBG:  Recent Labs Lab  05/20/14 1020 05/20/14 1039 05/20/14 1131 05/20/14 1224 05/20/14 1310  GLUCAP 169* 178* 154* 164* 191*    Recent Results (from the past 240 hour(s))  GC/CHLAMYDIA PROBE AMP     Status: None   Collection Time    05/19/14 11:59 AM      Result Value Ref Range Status   CT Probe RNA NEGATIVE  NEGATIVE Final   GC Probe RNA NEGATIVE  NEGATIVE Final   Comment: (NOTE)                                                                                               **Normal Reference Range: Negative**          Assay performed using the Gen-Probe APTIMA COMBO2 (R) Assay.     Acceptable specimen types for this assay include APTIMA Swabs (Unisex,     endocervical, urethral, or vaginal), first void urine, and ThinPrep     liquid based cytology samples.     Performed at [place], GENITAL     Status: Abnormal   Collection Time    05/19/14 11:59 AM      Result Value Ref Range Status   Yeast Wet Prep HPF POC NONE SEEN  NONE SEEN Final   Trich, Wet Prep FEW (*) NONE SEEN Final   Clue Cells Wet Prep HPF POC NONE SEEN  NONE SEEN Final   WBC, Wet Prep HPF POC FEW (*) NONE SEEN Final  MRSA PCR SCREENING     Status: None   Collection Time    05/20/14  1:09 AM      Result Value Ref Range Status   MRSA by PCR NEGATIVE  NEGATIVE Final   Comment:            The GeneXpert MRSA Assay (FDA     approved for NASAL specimens     only), is one component of a     comprehensive MRSA colonization     surveillance program. It is not     intended to diagnose MRSA     infection nor to guide or     monitor treatment for     MRSA infections.     Studies:  Recent x-ray studies have been reviewed in detail by the Attending Physician  Scheduled Meds:  Scheduled Meds: . enoxaparin (LOVENOX) injection  40 mg Subcutaneous Q24H  . living well with diabetes book   Does not apply Once  . metroNIDAZOLE  2,000 mg Oral Once  . pantoprazole (PROTONIX) IV  40 mg Intravenous Q24H  Time spent  on care of this patient: 40 mins   Allie Bossier , MD   [facility] Office  [phone] Pager [phone]  On-Call/Text Page:      [email or web address]      password [email or web address]  If 7PM-7AM, please contact night-coverage [email or web address] Password [email or web address] 05/20/2014, 2:13 PM   LOS: 1 day

## 2014-05-20 NOTE — Progress Notes (Signed)
Received Diabetes Coordinator consult for patient with new onset diabetes.  Noted that patient is on glucostabilizer at this time.  Will need Lantus and Novolog correction scale when ready to come off drip.  Recommend starting Lantus 15 units daily and Novolog SENSITIVE correction scale TID & HS.  Noted that CM has seen patient and has made an appointment with [facility] this Friday.  Patient can receive Lantus insulin at [facility] with a prescription.  Will see patient today. Harvel Ricks RN BSN CDE

## 2014-05-21 DIAGNOSIS — E785 Hyperlipidemia, unspecified: Secondary | ICD-10-CM | POA: Diagnosis present

## 2014-05-21 DIAGNOSIS — E0810 Diabetes mellitus due to underlying condition with ketoacidosis without coma: Secondary | ICD-10-CM

## 2014-05-21 LAB — GLUCOSE, CAPILLARY
GLUCOSE-CAPILLARY: 141 mg/dL — AB (ref 70–99)
GLUCOSE-CAPILLARY: 186 mg/dL — AB (ref 70–99)
GLUCOSE-CAPILLARY: 198 mg/dL — AB (ref 70–99)
GLUCOSE-CAPILLARY: 202 mg/dL — AB (ref 70–99)
GLUCOSE-CAPILLARY: 213 mg/dL — AB (ref 70–99)
GLUCOSE-CAPILLARY: 287 mg/dL — AB (ref 70–99)
Glucose-Capillary: 164 mg/dL — ABNORMAL HIGH (ref 70–99)
Glucose-Capillary: 149 mg/dL — ABNORMAL HIGH (ref 70–99)
Glucose-Capillary: 168 mg/dL — ABNORMAL HIGH (ref 70–99)
Glucose-Capillary: 123 mg/dL — ABNORMAL HIGH (ref 70–99)
Glucose-Capillary: 135 mg/dL — ABNORMAL HIGH (ref 70–99)
Glucose-Capillary: 149 mg/dL — ABNORMAL HIGH (ref 70–99)
Glucose-Capillary: 165 mg/dL — ABNORMAL HIGH (ref 70–99)
Glucose-Capillary: 175 mg/dL — ABNORMAL HIGH (ref 70–99)
Glucose-Capillary: 266 mg/dL — ABNORMAL HIGH (ref 70–99)

## 2014-05-21 LAB — BASIC METABOLIC PANEL
Anion gap: 16 — ABNORMAL HIGH (ref 5–15)
Anion gap: 15 (ref 5–15)
Anion gap: 12 (ref 5–15)
Anion gap: 13 (ref 5–15)
BUN: 5 mg/dL — ABNORMAL LOW (ref 6–23)
BUN: 5 mg/dL — AB (ref 6–23)
BUN: 6 mg/dL (ref 6–23)
BUN: 7 mg/dL (ref 6–23)
CO2: 19 mEq/L (ref 19–32)
CO2: 20 mEq/L (ref 19–32)
CO2: 23 mEq/L (ref 19–32)
CO2: 24 mEq/L (ref 19–32)
CREATININE: 0.66 mg/dL (ref 0.50–1.10)
Calcium: 8.2 mg/dL — ABNORMAL LOW (ref 8.4–10.5)
Calcium: 8.4 mg/dL (ref 8.4–10.5)
Calcium: 7.9 mg/dL — ABNORMAL LOW (ref 8.4–10.5)
Calcium: 8.2 mg/dL — ABNORMAL LOW (ref 8.4–10.5)
Chloride: 101 mEq/L (ref 96–112)
Chloride: 102 mEq/L (ref 96–112)
Chloride: 102 mEq/L (ref 96–112)
Chloride: 102 mEq/L (ref 96–112)
Creatinine, Ser: 0.72 mg/dL (ref 0.50–1.10)
Creatinine, Ser: 0.7 mg/dL (ref 0.50–1.10)
Creatinine, Ser: 0.69 mg/dL (ref 0.50–1.10)
GFR calc Af Amer: >90 mL/min (ref >90)
GFR calc Af Amer: >90 mL/min (ref >90)
GFR calc Af Amer: >90 mL/min (ref >90)
GFR calc non Af Amer: >90 mL/min (ref >90)
Glucose, Bld: 194 mg/dL — ABNORMAL HIGH (ref 70–99)
Glucose, Bld: 149 mg/dL — ABNORMAL HIGH (ref 70–99)
Glucose, Bld: 151 mg/dL — ABNORMAL HIGH (ref 70–99)
Glucose, Bld: 164 mg/dL — ABNORMAL HIGH (ref 70–99)
POTASSIUM: 2.6 meq/L — AB (ref 3.7–5.3)
POTASSIUM: 2.8 meq/L — AB (ref 3.7–5.3)
POTASSIUM: 3.1 meq/L — AB (ref 3.7–5.3)
Potassium: 2.5 mEq/L — CL (ref 3.7–5.3)
SODIUM: 136 meq/L — AB (ref 137–147)
SODIUM: 137 meq/L (ref 137–147)
Sodium: 138 mEq/L (ref 137–147)
Sodium: 138 mEq/L (ref 137–147)

## 2014-05-21 LAB — HIV ANTIBODY (ROUTINE TESTING W REFLEX): HIV 1&2 Ab, 4th Generation: NONREACTIVE

## 2014-05-21 LAB — MAGNESIUM: MAGNESIUM: 1.2 mg/dL — AB (ref 1.5–2.5)

## 2014-05-21 LAB — POTASSIUM: POTASSIUM: 2.8 meq/L — AB (ref 3.7–5.3)

## 2014-05-21 MED ORDER — SODIUM CHLORIDE 0.9 % IV SOLN
INTRAVENOUS | Status: DC
  Administered 2014-05-21 – 2014-05-25 (×7): via INTRAVENOUS

## 2014-05-21 MED ORDER — METFORMIN HCL 500 MG PO TABS
500.0000 mg | ORAL_TABLET | Freq: Two times a day (BID) | ORAL | Status: DC
  Administered 2014-05-21 – 2014-05-25 (×9): 500 mg via ORAL
  Filled 2014-05-21 (×11): qty 1

## 2014-05-21 MED ORDER — PANTOPRAZOLE SODIUM 40 MG PO TBEC
40.0000 mg | DELAYED_RELEASE_TABLET | Freq: Every day | ORAL | Status: DC
  Administered 2014-05-21 – 2014-05-24 (×4): 40 mg via ORAL
  Filled 2014-05-21 (×4): qty 1

## 2014-05-21 MED ORDER — POTASSIUM CHLORIDE CRYS ER 20 MEQ PO TBCR
50.0000 meq | EXTENDED_RELEASE_TABLET | Freq: Once | ORAL | Status: AC
  Administered 2014-05-21: 50 meq via ORAL
  Filled 2014-05-21: qty 1

## 2014-05-21 MED ORDER — ATORVASTATIN CALCIUM 20 MG PO TABS
20.0000 mg | ORAL_TABLET | Freq: Every day | ORAL | Status: DC
  Administered 2014-05-21 – 2014-05-24 (×4): 20 mg via ORAL
  Filled 2014-05-21 (×5): qty 1

## 2014-05-21 MED ORDER — MAGNESIUM SULFATE 50 % IJ SOLN
3.0000 g | Freq: Once | INTRAVENOUS | Status: AC
  Administered 2014-05-21: 3 g via INTRAVENOUS
  Filled 2014-05-21: qty 6

## 2014-05-21 MED ORDER — INSULIN ASPART PROT & ASPART (70-30 MIX) 100 UNIT/ML ~~LOC~~ SUSP
8.0000 [IU] | Freq: Two times a day (BID) | SUBCUTANEOUS | Status: DC
  Filled 2014-05-21: qty 10

## 2014-05-21 MED ORDER — INSULIN ASPART 100 UNIT/ML ~~LOC~~ SOLN
0.0000 [IU] | SUBCUTANEOUS | Status: DC
  Administered 2014-05-21: 8 [IU] via SUBCUTANEOUS
  Administered 2014-05-21: 5 [IU] via SUBCUTANEOUS
  Administered 2014-05-21: 8 [IU] via SUBCUTANEOUS
  Administered 2014-05-22: 3 [IU] via SUBCUTANEOUS
  Administered 2014-05-22: 8 [IU] via SUBCUTANEOUS
  Administered 2014-05-22: 3 [IU] via SUBCUTANEOUS
  Administered 2014-05-22: 5 [IU] via SUBCUTANEOUS
  Administered 2014-05-22 – 2014-05-23 (×3): 3 [IU] via SUBCUTANEOUS
  Administered 2014-05-23: 5 [IU] via SUBCUTANEOUS
  Administered 2014-05-23 (×2): 2 [IU] via SUBCUTANEOUS
  Administered 2014-05-23: 3 [IU] via SUBCUTANEOUS
  Administered 2014-05-24: 2 [IU] via SUBCUTANEOUS
  Administered 2014-05-24: 5 [IU] via SUBCUTANEOUS
  Administered 2014-05-24 – 2014-05-25 (×3): 2 [IU] via SUBCUTANEOUS

## 2014-05-21 MED ORDER — POTASSIUM CHLORIDE 10 MEQ/100ML IV SOLN
10.0000 meq | INTRAVENOUS | Status: AC
  Administered 2014-05-21 (×6): 10 meq via INTRAVENOUS
  Filled 2014-05-21 (×7): qty 100

## 2014-05-21 MED ORDER — INSULIN GLARGINE 100 UNIT/ML ~~LOC~~ SOLN
8.0000 [IU] | Freq: Once | SUBCUTANEOUS | Status: AC
  Administered 2014-05-21: 8 [IU] via SUBCUTANEOUS
  Filled 2014-05-21: qty 0.08

## 2014-05-21 NOTE — Progress Notes (Signed)
[place] TEAM 1 - Stepdown/ICU TEAM Progress Note  Krista Campbell KWI:[id number] DOB: May 08, 1971 DOA: 05/19/2014 PCP: No PCP Per Patient  Admit HPI / Brief Narrative: Krista Campbell is a 43 y.o.BF no significant PMHx  Presented to the ER with polyuria, polydipsia. She was in her usual state of health till about 2 weeks ago when she noticed, polyuria, polydipsia, and some polyphagia too.  In addition also noticed heartburn and some vaginal discharge which she attributed to polyuria and toilet paper.  In ER noted to have Hyperglycemia, no H/o DM, anion gap metabolic acidosis, hypokalemia   HPI/Subjective: 10/9 states no complaints   Assessment/Plan: DKA (diabetic ketoacidoses)  -new DM uncontrolled - Current AG= 12, transitioned over to Lantus and oral hypoglycemics  -transition to Insulin 70/30 in the a.m.   New DM uncontrolled -10/7 hemoglobin A1c = 13.6   -Patient hasn't been seen by DM coordinator consult, Dietician consult  -Patient started on Lantus 8 units today, will have to transition over to NovoLog 70/30 in the a.m. secondary to patient not being able to afford Lantus. -Start metformin 500 mg BID  HLD -Not within ADA guideline start Lipitor 20 mg  Hypotension -Although patient hypotensive, asymptomatic -Will continue normal saline 75 ml/hr  Hyponatremia  -Resolved    Hypokalemia  -K-Dur 50 mEq x2  -Check magnesium  Hypomagnesemia -Magnesium IV 3 g x1   Trichomoniasis vaginitis  - given metronidazole 2 g mx1,  -GC pending, HIV negative - Will discuss findings in the a.m. Sexual partner will need Rx too    Code Status: FULL Family Communication: no family present at time of exam Disposition Plan: Resolution of DKA    Consultants: NA  Procedure/Significant Events: NA   Culture 10/8 HIV negative 10/8 GC pending   Antibiotics: 10/7 metronidazole x1 dose  DVT prophylaxis: Lovenox   Devices NA   LINES / TUBES:       Continuous Infusions: . sodium chloride    . insulin (NOVOLIN-R) infusion Stopped (05/21/14 1304)    Objective: VITAL SIGNS: Temp: 98.2 F (36.8 C) (10/09 1630) Temp Source: Oral (10/09 1630) BP: 81/43 mmHg (10/09 1545) Pulse Rate: 98 (10/09 1545) SPO2; 100% on room air FIO2:   Intake/Output Summary (Last 24 hours) at 05/21/14 1838 Last data filed at 05/21/14 1612  Gross per 24 hour  Intake 3305.64 ml  Output   1425 ml  Net 1880.64 ml     Exam: General: A./O. x4, NAD, No acute respiratory distress Lungs: Clear to auscultation bilaterally without wheezes or crackles Cardiovascular: Regular rate and rhythm without murmur gallop or rub normal S1 and S2 Abdomen: Nontender, nondistended, soft, bowel sounds positive, no rebound, no ascites, no appreciable mass Extremities: No significant cyanosis, clubbing, or edema bilateral lower extremities  Data Reviewed: Basic Metabolic Panel:  Recent Labs Lab 05/20/14 1419 05/20/14 1900 05/21/14 0032 05/21/14 0258 05/21/14 0830 05/21/14 0926 05/21/14 1300  NA 136*  --  136* 137 138 138  --   K 3.5*  --  2.6* 2.5* 2.8* 3.1* 2.8*  CL 105  --  101 102 102 102  --   CO2 16*  --  19 20 24 23   --   GLUCOSE 110*  --  194* 149* 151* 164*  --   BUN 7  --  7 6 5* 5*  --   CREATININE 0.67  --  0.72 0.70 0.69 0.66  --   CALCIUM 8.4  --  8.2* 8.4 8.2* 7.9*  --  MG  --  1.4*  --   --   --   --  1.2*   Liver Function Tests:  Recent Labs Lab 05/20/14 0240  AST 14  ALT 12  ALKPHOS 62  BILITOT 1.1  PROT 7.4  ALBUMIN 3.5   No results found for this basename: LIPASE, AMYLASE,  in the last 168 hours No results found for this basename: AMMONIA,  in the last 168 hours CBC:  Recent Labs Lab 05/19/14 1155 05/20/14 0240  WBC 8.4 7.9  NEUTROABS 5.4  --   HGB 13.1 11.6*  HCT 37.7 32.3*  MCV 79.5 77.3*  PLT 269 251   Cardiac Enzymes: No results found for this basename: CKTOTAL, CKMB, CKMBINDEX, TROPONINI,  in the last  168 hours BNP (last 3 results) No results found for this basename: PROBNP,  in the last 8760 hours CBG:  Recent Labs Lab 05/21/14 0959 05/21/14 1103 05/21/14 1158 05/21/14 1256 05/21/14 1610  GLUCAP 175* 186* 202* 213* 266*    Recent Results (from the past 240 hour(s))  GC/CHLAMYDIA PROBE AMP     Status: None   Collection Time    05/19/14 11:59 AM      Result Value Ref Range Status   CT Probe RNA NEGATIVE  NEGATIVE Final   GC Probe RNA NEGATIVE  NEGATIVE Final   Comment: (NOTE)                                                                                               **Normal Reference Range: Negative**          Assay performed using the Gen-Probe APTIMA COMBO2 (R) Assay.     Acceptable specimen types for this assay include APTIMA Swabs (Unisex,     endocervical, urethral, or vaginal), first void urine, and ThinPrep     liquid based cytology samples.     Performed at [place], [facility]     Status: Abnormal   Collection Time    05/19/14 11:59 AM      Result Value Ref Range Status   Yeast Wet Prep HPF POC NONE SEEN  NONE SEEN Final   Trich, Wet Prep FEW (*) NONE SEEN Final   Clue Cells Wet Prep HPF POC NONE SEEN  NONE SEEN Final   WBC, Wet Prep HPF POC FEW (*) NONE SEEN Final  MRSA PCR SCREENING     Status: None   Collection Time    05/20/14  1:09 AM      Result Value Ref Range Status   MRSA by PCR NEGATIVE  NEGATIVE Final   Comment:            The GeneXpert MRSA Assay (FDA     approved for NASAL specimens     only), is one component of a     comprehensive MRSA colonization     surveillance program. It is not     intended to diagnose MRSA     infection nor to guide or     monitor treatment for     MRSA infections.     Studies:  Recent x-ray studies have been reviewed in detail by the Attending Physician  Scheduled Meds:  Scheduled Meds: . atorvastatin  20 mg Oral q1800  . enoxaparin (LOVENOX) injection  40 mg Subcutaneous Q24H  .  insulin aspart  0-15 Units Subcutaneous 6 times per day  . metFORMIN  500 mg Oral BID WC  . metroNIDAZOLE  2,000 mg Oral Once  . pantoprazole  40 mg Oral QHS    Time spent on care of this patient: 40 mins   Allie Bossier , MD   [facility] Office  [phone] Pager [phone]  On-Call/Text Page:      [email or web address]      password [email or web address]  If 7PM-7AM, please contact night-coverage [email or web address] Password [email or web address] 05/21/2014, 6:38 PM   LOS: 2 days

## 2014-05-21 NOTE — Care Management Note (Signed)
    Page 1 of 1   05/21/2014     11:43:52 AM CARE MANAGEMENT NOTE 05/21/2014  Patient:  Krista Campbell, Krista Campbell   Account Number:  [id number]  Date Initiated:  05/20/2014  Documentation initiated by:  Marvetta Gibbons  Subjective/Objective Assessment:   Pt admitted with DKA     Action/Plan:   PTA pt lived at home- [facility]- arranged appointment with [facility]- for 05/21/14-will need to be changed due to pt being in [place]- call pending   Anticipated DC Date:  05/23/2014   Anticipated DC Plan:  HOME/SELF CARE  In-house referral  [place]  CM consult  [facility]      Choice offered to / List presented to:             Status of service:  In process, will continue to follow Medicare Important Message given?  NO (If response is "NO", the following Medicare IM given date fields will be blank) Date Medicare IM given:   Medicare IM given by:   Date Additional Medicare IM given:   Additional Medicare IM given by:    Discharge Disposition:    Per UR Regulation:  Reviewed for med. necessity/level of care/duration of stay  If discussed at [place] of Stay Meetings, dates discussed:    Comments:  05/21/14- 1130- Marvetta Gibbons RN, BSN [phone] Spoke with pt at bedside- regarding PCP needs- pt had appointment for today at [facility]- have changed that appointment to next Tues. Oct. 13 at 9:00 am- this info given to pt- and placed on d/c info- pt will need glucose meter- prescription sheet placed on shadow chart for MD signature- NCM to continue to follow for any medication needs pt would be eligible for MATCH if needed

## 2014-05-21 NOTE — Progress Notes (Signed)
Noted that patient is off [place]. Spoke with staff RN about insulin given.  Stated that patient received Lantus 8 units before drip was stopped.  May need Lantus 15 units at HS tonight (0.2 units/kg) and continue Novolog MODERATE correction scale TID & HS if eating.  Will patient be discharged home on insulin?  If so, will need to taught insulin administration by staff before discharge. Will continue to follow while in hospital.  Harvel Ricks RN BSN CDE

## 2014-05-21 NOTE — Discharge Planning (Signed)
[place] Specialist was not able to see patient, [facility] orange card information and resources will be sent to the address provided.

## 2014-05-22 LAB — GLUCOSE, CAPILLARY
GLUCOSE-CAPILLARY: 182 mg/dL — AB (ref 70–99)
GLUCOSE-CAPILLARY: 195 mg/dL — AB (ref 70–99)
GLUCOSE-CAPILLARY: 203 mg/dL — AB (ref 70–99)
Glucose-Capillary: 177 mg/dL — ABNORMAL HIGH (ref 70–99)
Glucose-Capillary: 275 mg/dL — ABNORMAL HIGH (ref 70–99)
Glucose-Capillary: 167 mg/dL — ABNORMAL HIGH (ref 70–99)

## 2014-05-22 LAB — BASIC METABOLIC PANEL
ANION GAP: 10 (ref 5–15)
ANION GAP: 10 (ref 5–15)
BUN: 5 mg/dL — ABNORMAL LOW (ref 6–23)
BUN: 3 mg/dL — ABNORMAL LOW (ref 6–23)
CHLORIDE: 103 meq/L (ref 96–112)
CHLORIDE: 102 meq/L (ref 96–112)
CO2: 26 meq/L (ref 19–32)
CO2: 27 meq/L (ref 19–32)
Calcium: 8 mg/dL — ABNORMAL LOW (ref 8.4–10.5)
Calcium: 7.9 mg/dL — ABNORMAL LOW (ref 8.4–10.5)
Creatinine, Ser: 0.7 mg/dL (ref 0.50–1.10)
Creatinine, Ser: 0.73 mg/dL (ref 0.50–1.10)
GFR calc Af Amer: >90 mL/min (ref >90)
GFR calc Af Amer: >90 mL/min (ref >90)
GFR calc non Af Amer: >90 mL/min (ref >90)
GFR calc non Af Amer: >90 mL/min (ref >90)
Glucose, Bld: 189 mg/dL — ABNORMAL HIGH (ref 70–99)
Glucose, Bld: 200 mg/dL — ABNORMAL HIGH (ref 70–99)
POTASSIUM: 3 meq/L — AB (ref 3.7–5.3)
Potassium: 2.6 mEq/L — CL (ref 3.7–5.3)
Sodium: 139 mEq/L (ref 137–147)
Sodium: 139 mEq/L (ref 137–147)

## 2014-05-22 LAB — GC/CHLAMYDIA PROBE AMP
CT Probe RNA: NEGATIVE
GC Probe RNA: NEGATIVE

## 2014-05-22 LAB — MAGNESIUM: MAGNESIUM: 1.5 mg/dL (ref 1.5–2.5)

## 2014-05-22 MED ORDER — POTASSIUM CHLORIDE CRYS ER 20 MEQ PO TBCR
40.0000 meq | EXTENDED_RELEASE_TABLET | Freq: Once | ORAL | Status: AC
  Administered 2014-05-22: 40 meq via ORAL
  Filled 2014-05-22: qty 2

## 2014-05-22 MED ORDER — INSULIN ASPART PROT & ASPART (70-30 MIX) 100 UNIT/ML ~~LOC~~ SUSP
10.0000 [IU] | Freq: Two times a day (BID) | SUBCUTANEOUS | Status: DC
  Administered 2014-05-22 – 2014-05-23 (×2): 10 [IU] via SUBCUTANEOUS
  Filled 2014-05-22: qty 10

## 2014-05-22 MED ORDER — POTASSIUM CHLORIDE 10 MEQ/100ML IV SOLN
10.0000 meq | INTRAVENOUS | Status: AC
  Administered 2014-05-22 (×4): 10 meq via INTRAVENOUS
  Filled 2014-05-22 (×4): qty 100

## 2014-05-22 MED ORDER — MAGNESIUM SULFATE 40 MG/ML IJ SOLN
2.0000 g | Freq: Once | INTRAMUSCULAR | Status: AC
  Administered 2014-05-22: 2 g via INTRAVENOUS
  Filled 2014-05-22: qty 50

## 2014-05-22 MED ORDER — POTASSIUM CHLORIDE 10 MEQ/100ML IV SOLN
10.0000 meq | INTRAVENOUS | Status: AC
  Administered 2014-05-22 (×2): 10 meq via INTRAVENOUS
  Filled 2014-05-22 (×2): qty 100

## 2014-05-22 NOTE — Progress Notes (Signed)
[place] TEAM 1 - Stepdown/ICU TEAM Progress Note  Krista Campbell HEN:[id number] DOB: 1971/02/14 DOA: 05/19/2014 PCP: No PCP Per Patient  Admit HPI / Brief Narrative: Krista Campbell is a 43 y.o.BF no significant PMHx  Presented to the ER with polyuria, polydipsia. She was in her usual state of health till about 2 weeks ago when she noticed, polyuria, polydipsia, and some polyphagia too.  In addition also noticed heartburn and some vaginal discharge which she attributed to polyuria and toilet paper.  In ER noted to have Hyperglycemia, no H/o DM, anion gap metabolic acidosis, hypokalemia   HPI/Subjective: 10/10 states no complaints   Assessment/Plan: DKA (diabetic ketoacidoses)  -new DM uncontrolled - Current AG= 12, transitioned over to Lantus and oral hypoglycemics  -transition to Insulin 70/30 in the a.m.   New DM uncontrolled -10/7 hemoglobin A1c = 13.6   -Patient hasn't been seen by DM coordinator consult, Dietician consult  -Start NovoLog 70/30 10 units  BID  -Continue moderate SSI -Continue metformin 500 mg BID  HLD -Not within ADA guideline start Lipitor 20 mg  Hypotension -Although patient hypotensive, asymptomatic -Will continue normal saline 75 ml/hr  Hyponatremia  -Resolved    Hypokalemia  -Potassium IV 10 mEq x2  -K-Dur 40 mEq x1  Hypomagnesemia -Magnesium IV 2 gm x1   Trichomoniasis vaginitis  - given metronidazole 2 g mx1,  -GC negative, HIV negative - Will discuss findings in the a.m. Sexual partner will need Rx too    Code Status: FULL Family Communication: no family present at time of exam Disposition Plan: Resolution of DKA    Consultants: NA  Procedure/Significant Events: NA   Culture 10/8 HIV negative 10/8 GC negative   Antibiotics: 10/7 metronidazole x1 dose  DVT prophylaxis: Lovenox   Devices NA   LINES / TUBES:      Continuous Infusions: . sodium chloride 75 mL/hr at 05/22/14 1022  . insulin  (NOVOLIN-R) infusion Stopped (05/21/14 1304)    Objective: VITAL SIGNS: Temp: 98.2 F (36.8 C) (10/10 1202) Temp Source: Oral (10/10 1202) BP: 76/47 mmHg (10/10 1202) Pulse Rate: 109 (10/10 1202) SPO2; 100% on room air FIO2:   Intake/Output Summary (Last 24 hours) at 05/22/14 1402 Last data filed at 05/22/14 0600  Gross per 24 hour  Intake   1106 ml  Output   2000 ml  Net   -894 ml     Exam: General: A./O. x4, NAD, No acute respiratory distress Lungs: Clear to auscultation bilaterally without wheezes or crackles Cardiovascular: Regular rate and rhythm without murmur gallop or rub normal S1 and S2 Abdomen: Nontender, nondistended, soft, bowel sounds positive, no rebound, no ascites, no appreciable mass Extremities: No significant cyanosis, clubbing, or edema bilateral lower extremities  Data Reviewed: Basic Metabolic Panel:  Recent Labs Lab 05/20/14 1419 05/20/14 1900 05/21/14 0032 05/21/14 0258 05/21/14 0830 05/21/14 0926 05/21/14 1300 05/22/14 0239  NA 136*  --  136* 137 138 138  --  139  K 3.5*  --  2.6* 2.5* 2.8* 3.1* 2.8* 2.6*  CL 105  --  101 102 102 102  --  103  CO2 16*  --  19 20 24 23   --  26  GLUCOSE 110*  --  194* 149* 151* 164*  --  189*  BUN 7  --  7 6 5* 5*  --  5*  CREATININE 0.67  --  0.72 0.70 0.69 0.66  --  0.70  CALCIUM 8.4  --  8.2* 8.4 8.2*  7.9*  --  8.0*  MG  --  1.4*  --   --   --   --  1.2*  --    Liver Function Tests:  Recent Labs Lab 05/20/14 0240  AST 14  ALT 12  ALKPHOS 62  BILITOT 1.1  PROT 7.4  ALBUMIN 3.5   No results found for this basename: LIPASE, AMYLASE,  in the last 168 hours No results found for this basename: AMMONIA,  in the last 168 hours CBC:  Recent Labs Lab 05/19/14 1155 05/20/14 0240  WBC 8.4 7.9  NEUTROABS 5.4  --   HGB 13.1 11.6*  HCT 37.7 32.3*  MCV 79.5 77.3*  PLT 269 251   Cardiac Enzymes: No results found for this basename: CKTOTAL, CKMB, CKMBINDEX, TROPONINI,  in the last 168  hours BNP (last 3 results) No results found for this basename: PROBNP,  in the last 8760 hours CBG:  Recent Labs Lab 05/21/14 1946 05/21/14 2356 05/22/14 0538 05/22/14 0842 05/22/14 1205  GLUCAP 287* 203* 182* 177* 275*    Recent Results (from the past 240 hour(s))  GC/CHLAMYDIA PROBE AMP     Status: None   Collection Time    05/19/14 11:59 AM      Result Value Ref Range Status   CT Probe RNA NEGATIVE  NEGATIVE Final   GC Probe RNA NEGATIVE  NEGATIVE Final   Comment: (NOTE)                                                                                               **Normal Reference Range: Negative**          Assay performed using the Gen-Probe APTIMA COMBO2 (R) Assay.     Acceptable specimen types for this assay include APTIMA Swabs (Unisex,     endocervical, urethral, or vaginal), first void urine, and ThinPrep     liquid based cytology samples.     Performed at [place], [facility]     Status: Abnormal   Collection Time    05/19/14 11:59 AM      Result Value Ref Range Status   Yeast Wet Prep HPF POC NONE SEEN  NONE SEEN Final   Trich, Wet Prep FEW (*) NONE SEEN Final   Clue Cells Wet Prep HPF POC NONE SEEN  NONE SEEN Final   WBC, Wet Prep HPF POC FEW (*) NONE SEEN Final  MRSA PCR SCREENING     Status: None   Collection Time    05/20/14  1:09 AM      Result Value Ref Range Status   MRSA by PCR NEGATIVE  NEGATIVE Final   Comment:            The GeneXpert MRSA Assay (FDA     approved for NASAL specimens     only), is one component of a     comprehensive MRSA colonization     surveillance program. It is not     intended to diagnose MRSA     infection nor to guide or     monitor treatment for  MRSA infections.     Studies:  Recent x-ray studies have been reviewed in detail by the Attending Physician  Scheduled Meds:  Scheduled Meds: . atorvastatin  20 mg Oral q1800  . enoxaparin (LOVENOX) injection  40 mg Subcutaneous Q24H  .  insulin aspart  0-15 Units Subcutaneous 6 times per day  . insulin aspart protamine- aspart  10 Units Subcutaneous BID WC  . metFORMIN  500 mg Oral BID WC  . metroNIDAZOLE  2,000 mg Oral Once  . pantoprazole  40 mg Oral QHS  . potassium chloride  10 mEq Intravenous Q1 Hr x 2    Time spent on care of this patient: 40 mins   Allie Bossier , MD   [facility] Office  [phone] Pager [phone]  On-Call/Text Page:      [email or web address]      password [email or web address]  If 7PM-7AM, please contact night-coverage [email or web address] Password [email or web address] 05/22/2014, 2:02 PM   LOS: 3 days

## 2014-05-22 NOTE — Progress Notes (Signed)
CRITICAL VALUE ALERT  Critical value received:  K 2.6  Date of notification: 05/22/14  Time of notification:  5:04 am  Critical value read back: yes  Nurse who received alert:  Lyda Kalata   MD notified (1st page):   Fredirick Maudlin  Time of first page:  5:04 am    Responding MD:    Time MD responded:

## 2014-05-23 DIAGNOSIS — E1165 Type 2 diabetes mellitus with hyperglycemia: Secondary | ICD-10-CM

## 2014-05-23 LAB — GLUCOSE, CAPILLARY
GLUCOSE-CAPILLARY: 139 mg/dL — AB (ref 70–99)
Glucose-Capillary: 103 mg/dL — ABNORMAL HIGH (ref 70–99)
Glucose-Capillary: 149 mg/dL — ABNORMAL HIGH (ref 70–99)
Glucose-Capillary: 174 mg/dL — ABNORMAL HIGH (ref 70–99)
Glucose-Capillary: 180 mg/dL — ABNORMAL HIGH (ref 70–99)
Glucose-Capillary: 240 mg/dL — ABNORMAL HIGH (ref 70–99)

## 2014-05-23 MED ORDER — INSULIN ASPART PROT & ASPART (70-30 MIX) 100 UNIT/ML ~~LOC~~ SUSP
18.0000 [IU] | Freq: Two times a day (BID) | SUBCUTANEOUS | Status: DC
  Administered 2014-05-23 – 2014-05-25 (×4): 18 [IU] via SUBCUTANEOUS

## 2014-05-23 NOTE — Progress Notes (Signed)
[place] TEAM 1 - Stepdown/ICU TEAM Progress Note  Krista PRIDMORE EXH:[id number] DOB: [birth year] DOA: 05/19/2014 PCP: No PCP Per Patient  Admit HPI / Brief Narrative: Krista Campbell is a 43 y.o.BF no significant PMHx  Presented to the ER with polyuria, polydipsia. She was in her usual state of health till about 2 weeks ago when she noticed, polyuria, polydipsia, and some polyphagia too.  In addition also noticed heartburn and some vaginal discharge which she attributed to polyuria and toilet paper.  In ER noted to have Hyperglycemia, no H/o DM, anion gap metabolic acidosis, hypokalemia   HPI/Subjective: 10/11 states no complaints   Assessment/Plan: DKA (diabetic ketoacidoses)  -new DM uncontrolled - Current AG= 12, transitioned over to Lantus and oral hypoglycemics  -DKA resolved  New DM uncontrolled -10/7 hemoglobin A1c = 13.6   -Patient has been seen by DM coordinator and dietitian. Consider reconsulting diabetic coordinator to fine tune patient's insulin regimen. -Start NovoLog 70/30 18 units  BID ; NOTE patient must use his insulin secondary to being self pay -Continue moderate SSI -Continue metformin 500 mg BID  HLD -Not within ADA guideline start Lipitor 20 mg  Hypotension -Although patient hypotensive, asymptomatic -Will continue normal saline 75 ml/hr  Hyponatremia  -Resolved    Hypokalemia  -Recheck in a.m.   Hypomagnesemia -Recheck in a.m.    Trichomoniasis vaginitis  - given metronidazole 2 g mx1,  -GC negative, HIV negative - Will discuss findings in the a.m. Sexual partner will need Rx too    Code Status: FULL Family Communication: no family present at time of exam Disposition Plan: Resolution of DKA    Consultants: NA  Procedure/Significant Events: NA   Culture 10/8 HIV negative 10/8 GC negative   Antibiotics: 10/7 metronidazole x1 dose  DVT prophylaxis: Lovenox   Devices NA   LINES / TUBES:      Continuous  Infusions: . sodium chloride 75 mL/hr at 05/23/14 1107  . insulin (NOVOLIN-R) infusion Stopped (05/21/14 1304)    Objective: VITAL SIGNS: Temp: 98.4 F (36.9 C) (10/11 1206) Temp Source: Oral (10/11 1206) BP: 86/48 mmHg (10/11 1206) Pulse Rate: 97 (10/11 1206) SPO2; 100% on room air FIO2:   Intake/Output Summary (Last 24 hours) at 05/23/14 1529 Last data filed at 05/23/14 0900  Gross per 24 hour  Intake   3075 ml  Output   1450 ml  Net   1625 ml     Exam: General: A./O. x4, NAD, No acute respiratory distress Lungs: Clear to auscultation bilaterally without wheezes or crackles Cardiovascular: Regular rate and rhythm without murmur gallop or rub normal S1 and S2 Abdomen: Nontender, nondistended, soft, bowel sounds positive, no rebound, no ascites, no appreciable mass Extremities: No significant cyanosis, clubbing, or edema bilateral lower extremities  Data Reviewed: Basic Metabolic Panel:  Recent Labs Lab 05/20/14 1419 05/20/14 1900  05/21/14 0258 05/21/14 0830 05/21/14 0926 05/21/14 1300 05/22/14 0239 05/22/14 1450  NA 136*  --   < > 137 138 138  --  139 139  K 3.5*  --   < > 2.5* 2.8* 3.1* 2.8* 2.6* 3.0*  CL 105  --   < > 102 102 102  --  103 102  CO2 16*  --   < > 20 24 23   --  26 27  GLUCOSE 110*  --   < > 149* 151* 164*  --  189* 200*  BUN 7  --   < > 6 5* 5*  --  5* 3*  CREATININE 0.67  --   < > 0.70 0.69 0.66  --  0.70 0.73  CALCIUM 8.4  --   < > 8.4 8.2* 7.9*  --  8.0* 7.9*  MG  --  1.4*  --   --   --   --  1.2*  --  1.5  < > = values in this interval not displayed. Liver Function Tests:  Recent Labs Lab 05/20/14 0240  AST 14  ALT 12  ALKPHOS 62  BILITOT 1.1  PROT 7.4  ALBUMIN 3.5   No results found for this basename: LIPASE, AMYLASE,  in the last 168 hours No results found for this basename: AMMONIA,  in the last 168 hours CBC:  Recent Labs Lab 05/19/14 1155 05/20/14 0240  WBC 8.4 7.9  NEUTROABS 5.4  --   HGB 13.1 11.6*  HCT 37.7  32.3*  MCV 79.5 77.3*  PLT 269 251   Cardiac Enzymes: No results found for this basename: CKTOTAL, CKMB, CKMBINDEX, TROPONINI,  in the last 168 hours BNP (last 3 results) No results found for this basename: PROBNP,  in the last 8760 hours CBG:  Recent Labs Lab 05/22/14 2001 05/23/14 0017 05/23/14 0431 05/23/14 0834 05/23/14 1209  GLUCAP 167* 240* 149* 174* 180*    Recent Results (from the past 240 hour(s))  GC/CHLAMYDIA PROBE AMP     Status: None   Collection Time    05/19/14 11:59 AM      Result Value Ref Range Status   CT Probe RNA NEGATIVE  NEGATIVE Final   GC Probe RNA NEGATIVE  NEGATIVE Final   Comment: (NOTE)                                                                                               **Normal Reference Range: Negative**          Assay performed using the Gen-Probe APTIMA COMBO2 (R) Assay.     Acceptable specimen types for this assay include APTIMA Swabs (Unisex,     endocervical, urethral, or vaginal), first void urine, and ThinPrep     liquid based cytology samples.     Performed at [place], [facility]     Status: Abnormal   Collection Time    05/19/14 11:59 AM      Result Value Ref Range Status   Yeast Wet Prep HPF POC NONE SEEN  NONE SEEN Final   Trich, Wet Prep FEW (*) NONE SEEN Final   Clue Cells Wet Prep HPF POC NONE SEEN  NONE SEEN Final   WBC, Wet Prep HPF POC FEW (*) NONE SEEN Final  MRSA PCR SCREENING     Status: None   Collection Time    05/20/14  1:09 AM      Result Value Ref Range Status   MRSA by PCR NEGATIVE  NEGATIVE Final   Comment:            The GeneXpert MRSA Assay (FDA     approved for NASAL specimens     only), is one component of a  comprehensive MRSA colonization     surveillance program. It is not     intended to diagnose MRSA     infection nor to guide or     monitor treatment for     MRSA infections.  GC/CHLAMYDIA PROBE AMP     Status: None   Collection Time    05/21/14  2:30 AM       Result Value Ref Range Status   CT Probe RNA NEGATIVE  NEGATIVE Final   GC Probe RNA NEGATIVE  NEGATIVE Final   Comment: (NOTE)                                                                                               **Normal Reference Range: Negative**          Assay performed using the Gen-Probe APTIMA COMBO2 (R) Assay.     Acceptable specimen types for this assay include APTIMA Swabs (Unisex,     endocervical, urethral, or vaginal), first void urine, and ThinPrep     liquid based cytology samples.     Performed at [facility]     Studies:  Recent x-ray studies have been reviewed in detail by the Attending Physician  Scheduled Meds:  Scheduled Meds: . atorvastatin  20 mg Oral q1800  . enoxaparin (LOVENOX) injection  40 mg Subcutaneous Q24H  . insulin aspart  0-15 Units Subcutaneous 6 times per day  . insulin aspart protamine- aspart  18 Units Subcutaneous BID WC  . metFORMIN  500 mg Oral BID WC  . metroNIDAZOLE  2,000 mg Oral Once  . pantoprazole  40 mg Oral QHS    Time spent on care of this patient: 40 mins   Allie Bossier , MD   [facility] Office  [phone] Pager [phone]  On-Call/Text Page:      [email or web address]      password [email or web address]  If 7PM-7AM, please contact night-coverage [email or web address] Password [email or web address] 05/23/2014, 3:29 PM   LOS: 4 days

## 2014-05-24 LAB — GLUCOSE, CAPILLARY
GLUCOSE-CAPILLARY: 142 mg/dL — AB (ref 70–99)
GLUCOSE-CAPILLARY: 94 mg/dL (ref 70–99)
Glucose-Capillary: 134 mg/dL — ABNORMAL HIGH (ref 70–99)
Glucose-Capillary: 133 mg/dL — ABNORMAL HIGH (ref 70–99)
Glucose-Capillary: 204 mg/dL — ABNORMAL HIGH (ref 70–99)
Glucose-Capillary: 131 mg/dL — ABNORMAL HIGH (ref 70–99)

## 2014-05-24 LAB — BASIC METABOLIC PANEL
ANION GAP: 12 (ref 5–15)
Anion gap: 10 (ref 5–15)
BUN: 5 mg/dL — AB (ref 6–23)
BUN: 6 mg/dL (ref 6–23)
CALCIUM: 8.6 mg/dL (ref 8.4–10.5)
CHLORIDE: 102 meq/L (ref 96–112)
CO2: 29 meq/L (ref 19–32)
CO2: 27 meq/L (ref 19–32)
CREATININE: 0.72 mg/dL (ref 0.50–1.10)
Calcium: 8.2 mg/dL — ABNORMAL LOW (ref 8.4–10.5)
Chloride: 100 mEq/L (ref 96–112)
Creatinine, Ser: 0.72 mg/dL (ref 0.50–1.10)
GFR calc Af Amer: >90 mL/min (ref >90)
GFR calc non Af Amer: >90 mL/min (ref >90)
GFR calc non Af Amer: >90 mL/min (ref >90)
GLUCOSE: 171 mg/dL — AB (ref 70–99)
Glucose, Bld: 150 mg/dL — ABNORMAL HIGH (ref 70–99)
POTASSIUM: 2.7 meq/L — AB (ref 3.7–5.3)
Potassium: 4.2 mEq/L (ref 3.7–5.3)
Sodium: 141 mEq/L (ref 137–147)
Sodium: 139 mEq/L (ref 137–147)

## 2014-05-24 LAB — MAGNESIUM: Magnesium: 1.1 mg/dL — ABNORMAL LOW (ref 1.5–2.5)

## 2014-05-24 MED ORDER — SODIUM CHLORIDE 0.9 % IV BOLUS (SEPSIS)
500.0000 mL | Freq: Once | INTRAVENOUS | Status: AC
  Administered 2014-05-24: 500 mL via INTRAVENOUS

## 2014-05-24 MED ORDER — POTASSIUM CHLORIDE CRYS ER 20 MEQ PO TBCR
40.0000 meq | EXTENDED_RELEASE_TABLET | ORAL | Status: AC
  Administered 2014-05-24 (×2): 40 meq via ORAL
  Filled 2014-05-24 (×2): qty 2

## 2014-05-24 MED ORDER — LIVING WELL WITH DIABETES BOOK
Freq: Once | Status: AC
  Administered 2014-05-24: 16:00:00
  Filled 2014-05-24: qty 1

## 2014-05-24 MED ORDER — MAGNESIUM SULFATE 40 MG/ML IJ SOLN
2.0000 g | Freq: Once | INTRAMUSCULAR | Status: AC
  Administered 2014-05-24: 2 g via INTRAVENOUS
  Filled 2014-05-24: qty 50

## 2014-05-24 NOTE — Progress Notes (Signed)
BP this evening 75/57.  Asymptomatic.  Lamar Blinks, NP paged and made aware; orders received for 500 cc bolus of normal saline.  Bolus administered.  BP rechecked and was 89/54; asymptomatic.  NP notified again.  No new orders received.  Will continue to monitor.

## 2014-05-24 NOTE — Plan of Care (Signed)
Problem: Food- and Nutrition-Related Knowledge Deficit (NB-1.1) Goal: Nutrition education Formal process to instruct or train a patient/client in a skill or to impart knowledge to help patients/clients voluntarily manage or modify food choices and eating behavior to maintain or improve health. Outcome: Completed/Met Date Met:  05/24/14  RD consulted for nutrition education regarding diabetes. Noted RD has previously educated pt on 10/8 on a diabetic diet, however pt requests additional education.     Lab Results  Component Value Date    HGBA1C 13.6* 05/19/2014    RD provided "Carbohydrate Counting for People with Diabetes" handout from the Academy of Nutrition and Dietetics as pt reports she can not find the handout from last visit. Reviewed and discussed different food groups and their effects on blood sugar, emphasizing carbohydrate-containing foods. Provided list of carbohydrates and recommended serving sizes of common foods.  Specifically emphasized and discussed importance of controlled and consistent carbohydrate intake throughout the day. Provided examples of ways to balance meals/snacks and encouraged intake of high-fiber, whole grain complex carbohydrates. Educated on how to count carbohydrates at each meal. Teach back method used.  Expect good compliance.  Body mass index is 35.48 kg/(m^2). Pt meets criteria for class II obesity based on current BMI.  Labs and medications reviewed. No further nutrition interventions warranted at this time. RD contact information provided. If additional nutrition issues arise, please re-consult RD.  Kallie Locks, MS, RD, Provisional LDN Pager # [phone] After hours/ weekend pager # [phone]

## 2014-05-24 NOTE — Progress Notes (Signed)
Introduced pt to insulin administration by having her draw up 2 units with syringe.  Pt was capable of re verbalizing the steps and correctly drew up 2 units. Pt became tearful but states "I'm OK".  Krista Campbell has no further questions at this time and is agreeable to attempt administration with am insulin administration. Dorthey Sawyer, RN

## 2014-05-24 NOTE — Progress Notes (Signed)
Patient refused DM video education at this time; states she is too sleepy to watch.  Will try again in morning.

## 2014-05-24 NOTE — Progress Notes (Signed)
Progress Note  Krista Campbell:[id number] DOB: January 15, 1971 DOA: 05/19/2014 PCP: No PCP Per Patient  Admit HPI / Brief Narrative: Krista Campbell is a 43 y.o.BF no significant PMHx  Presented to the ER with polyuria, polydipsia. She was in her usual state of health till about 2 weeks ago when she noticed, polyuria, polydipsia, and some polyphagia too.  In addition also noticed heartburn and some vaginal discharge which she attributed to polyuria and toilet paper.  In ER noted to have Hyperglycemia, no H/o DM, anion gap metabolic acidosis, hypokalemia   HPI/Subjective: Tired of being woken up  Assessment/Plan: DKA (diabetic ketoacidoses)/New DM uncontrolled -new DM uncontrolled -DKA resolved -70/30 as patient has no insurnace -10/7 hemoglobin A1c = 13.6   -Patient has been seen by DM coordinator and dietitian. Consider reconsulting diabetic coordinator to fine tune patient's insulin regimen. -Continue moderate SSI -Continue metformin 500 mg BID  HLD -Not within ADA guideline start Lipitor 20 mg  Hypotension -Although patient hypotensive, asymptomatic -Will continue normal saline 75 ml/hr  Hyponatremia  -Resolved    Hypokalemia  -Replace   Hypomagnesemia -Replace   Trichomoniasis vaginitis  - given metronidazole 2 g mx1,  -GC negative, HIV negative - Dr. Sherral Hammers discussed with patient that Sexual partner will need Rx too    Code Status: FULL Family Communication: no family present at time of exam Disposition Plan: home once K/Mg replaced and patient educated about DM    Consultants: NA  Procedure/Significant Events: NA   Culture 10/8 HIV negative 10/8 GC negative   Antibiotics: 10/7 metronidazole x1 dose  DVT prophylaxis: Lovenox   Devices NA   LINES / TUBES:      Continuous Infusions: . sodium chloride 75 mL/hr at 05/23/14 2300  . insulin (NOVOLIN-R) infusion Stopped (05/21/14 1304)    Objective: VITAL SIGNS: Temp: 98.6 F  (37 C) (10/12 0900) Temp Source: Oral (10/12 0900) BP: 88/55 mmHg (10/12 0900) Pulse Rate: 91 (10/12 0900) SPO2; 100% on room air FIO2:   Intake/Output Summary (Last 24 hours) at 05/24/14 1013 Last data filed at 05/23/14 2300  Gross per 24 hour  Intake   1290 ml  Output   2000 ml  Net   -710 ml     Exam: General: A./O. x4, NAD, No acute respiratory distress Lungs: Clear to auscultation bilaterally without wheezes or crackles Cardiovascular: Regular rate and rhythm without murmur gallop or rub normal S1 and S2 Abdomen: Nontender, nondistended, soft, bowel sounds positive, no rebound, no ascites, no appreciable mass Extremities: No significant cyanosis, clubbing, or edema bilateral lower extremities  Data Reviewed: Basic Metabolic Panel:  Recent Labs Lab 05/20/14 1419 05/20/14 1900  05/21/14 0830 05/21/14 0926 05/21/14 1300 05/22/14 0239 05/22/14 1450 05/24/14 0512  NA 136*  --   < > 138 138  --  139 139 141  K 3.5*  --   < > 2.8* 3.1* 2.8* 2.6* 3.0* 2.7*  CL 105  --   < > 102 102  --  103 102 102  CO2 16*  --   < > 24 23  --  26 27 29   GLUCOSE 110*  --   < > 151* 164*  --  189* 200* 150*  BUN 7  --   < > 5* 5*  --  5* 3* 5*  CREATININE 0.67  --   < > 0.69 0.66  --  0.70 0.73 0.72  CALCIUM 8.4  --   < > 8.2* 7.9*  --  8.0* 7.9* 8.2*  MG  --  1.4*  --   --   --  1.2*  --  1.5 1.1*  < > = values in this interval not displayed. Liver Function Tests:  Recent Labs Lab 05/20/14 0240  AST 14  ALT 12  ALKPHOS 62  BILITOT 1.1  PROT 7.4  ALBUMIN 3.5   No results found for this basename: LIPASE, AMYLASE,  in the last 168 hours No results found for this basename: AMMONIA,  in the last 168 hours CBC:  Recent Labs Lab 05/19/14 1155 05/20/14 0240  WBC 8.4 7.9  NEUTROABS 5.4  --   HGB 13.1 11.6*  HCT 37.7 32.3*  MCV 79.5 77.3*  PLT 269 251   Cardiac Enzymes: No results found for this basename: CKTOTAL, CKMB, CKMBINDEX, TROPONINI,  in the last 168 hours BNP  (last 3 results) No results found for this basename: PROBNP,  in the last 8760 hours CBG:  Recent Labs Lab 05/23/14 1607 05/23/14 2017 05/24/14 0015 05/24/14 0422 05/24/14 0752  GLUCAP 103* 139* 142* 134* 133*    Recent Results (from the past 240 hour(s))  GC/CHLAMYDIA PROBE AMP     Status: None   Collection Time    05/19/14 11:59 AM      Result Value Ref Range Status   CT Probe RNA NEGATIVE  NEGATIVE Final   GC Probe RNA NEGATIVE  NEGATIVE Final   Comment: (NOTE)                                                                                               **Normal Reference Range: Negative**          Assay performed using the Gen-Probe APTIMA COMBO2 (R) Assay.     Acceptable specimen types for this assay include APTIMA Swabs (Unisex,     endocervical, urethral, or vaginal), first void urine, and ThinPrep     liquid based cytology samples.     Performed at [place], GENITAL     Status: Abnormal   Collection Time    05/19/14 11:59 AM      Result Value Ref Range Status   Yeast Wet Prep HPF POC NONE SEEN  NONE SEEN Final   Trich, Wet Prep FEW (*) NONE SEEN Final   Clue Cells Wet Prep HPF POC NONE SEEN  NONE SEEN Final   WBC, Wet Prep HPF POC FEW (*) NONE SEEN Final  MRSA PCR SCREENING     Status: None   Collection Time    05/20/14  1:09 AM      Result Value Ref Range Status   MRSA by PCR NEGATIVE  NEGATIVE Final   Comment:            The GeneXpert MRSA Assay (FDA     approved for NASAL specimens     only), is one component of a     comprehensive MRSA colonization     surveillance program. It is not     intended to diagnose MRSA     infection nor to guide or  monitor treatment for     MRSA infections.  GC/CHLAMYDIA PROBE AMP     Status: None   Collection Time    05/21/14  2:30 AM      Result Value Ref Range Status   CT Probe RNA NEGATIVE  NEGATIVE Final   GC Probe RNA NEGATIVE  NEGATIVE Final   Comment: (NOTE)                                                                                                **Normal Reference Range: Negative**          Assay performed using the Gen-Probe APTIMA COMBO2 (R) Assay.     Acceptable specimen types for this assay include APTIMA Swabs (Unisex,     endocervical, urethral, or vaginal), first void urine, and ThinPrep     liquid based cytology samples.     Performed at [facility]     Studies:  Recent x-ray studies have been reviewed in detail by the Attending Physician  Scheduled Meds:  Scheduled Meds: . atorvastatin  20 mg Oral q1800  . enoxaparin (LOVENOX) injection  40 mg Subcutaneous Q24H  . insulin aspart  0-15 Units Subcutaneous 6 times per day  . insulin aspart protamine- aspart  18 Units Subcutaneous BID WC  . magnesium sulfate 1 - 4 g bolus IVPB  2 g Intravenous Once  . metFORMIN  500 mg Oral BID WC  . metroNIDAZOLE  2,000 mg Oral Once  . pantoprazole  40 mg Oral QHS  . potassium chloride  40 mEq Oral Q4H    Time spent on care of this patient: 25 mins   Eulogio Bear DO  [facility] [phone]  On-Call/Text Page:      [email or web address]      password [email or web address]  If 7PM-7AM, please contact night-coverage [email or web address] Password [email or web address] 05/24/2014, 10:13 AM   LOS: 5 days

## 2014-05-24 NOTE — Progress Notes (Signed)
Pt transferred from 3S via wheelchair.  Patient is alert oriented x 4.  Oriented to room, unit, and staff. Dorthey Sawyer, RN

## 2014-05-24 NOTE — Progress Notes (Signed)
Patient diabetic teaching performed, included Video #502, patient self-admin of 2 insulin injections, sliding scale & CBG checks.  Jillyn Ledger, MBA, BS, RN

## 2014-05-24 NOTE — Progress Notes (Signed)
Inpatient Diabetes Program Recommendations  AACE/ADA: New Consensus Statement on Inpatient Glycemic Control (2013)  Target Ranges:  Prepandial:   less than 140 mg/dL      Peak postprandial:   less than 180 mg/dL (1-2 hours)      Critically ill patients:  140 - 180 mg/dL   Noted pt on 70/30 bid. Pt is Medicaid potential. If plan on sending home on insulin, please order insulin instruction per bedside RN.  Diabetes coordinator will follow patient and assess ed'l needs,. Will order pt ed via videos, ed'l booklet. If to be d/c'd on insulin, will order insulin starter kit using vial and syringe (no insurance at this time).   Thank you, Rosita Kea, RN, CNS, Diabetes Coordinator [phone])

## 2014-05-25 LAB — BASIC METABOLIC PANEL
Anion gap: 10 (ref 5–15)
BUN: 6 mg/dL (ref 6–23)
CHLORIDE: 105 meq/L (ref 96–112)
CO2: 27 meq/L (ref 19–32)
Calcium: 8.4 mg/dL (ref 8.4–10.5)
Creatinine, Ser: 0.8 mg/dL (ref 0.50–1.10)
GFR calc Af Amer: >90 mL/min (ref >90)
GFR calc non Af Amer: 90 mL/min — ABNORMAL LOW (ref >90)
Glucose, Bld: 119 mg/dL — ABNORMAL HIGH (ref 70–99)
Potassium: 3.8 mEq/L (ref 3.7–5.3)
Sodium: 142 mEq/L (ref 137–147)

## 2014-05-25 LAB — CBC
HCT: 26.1 % — ABNORMAL LOW (ref 36.0–46.0)
HEMOGLOBIN: 8.8 g/dL — AB (ref 12.0–15.0)
MCH: 27.8 pg (ref 26.0–34.0)
MCHC: 33.7 g/dL (ref 30.0–36.0)
MCV: 82.6 fL (ref 78.0–100.0)
Platelets: 241 10*3/uL (ref 150–400)
RBC: 3.16 MIL/uL — AB (ref 3.87–5.11)
RDW: 15.3 % (ref 11.5–15.5)
WBC: 8.7 10*3/uL (ref 4.0–10.5)

## 2014-05-25 LAB — GLUCOSE, CAPILLARY
GLUCOSE-CAPILLARY: 122 mg/dL — AB (ref 70–99)
GLUCOSE-CAPILLARY: 104 mg/dL — AB (ref 70–99)
Glucose-Capillary: 79 mg/dL (ref 70–99)
Glucose-Capillary: 89 mg/dL (ref 70–99)

## 2014-05-25 LAB — MAGNESIUM: Magnesium: 1.4 mg/dL — ABNORMAL LOW (ref 1.5–2.5)

## 2014-05-25 MED ORDER — GLUCOSE BLOOD VI STRP: ORAL_STRIP | Status: DC

## 2014-05-25 MED ORDER — INSULIN ASPART PROT & ASPART (70-30 MIX) 100 UNIT/ML PEN: 18.0000 [IU] | PEN_INJECTOR | Freq: Two times a day (BID) | SUBCUTANEOUS | Status: DC

## 2014-05-25 MED ORDER — FREESTYLE SYSTEM KIT: 1.0000 | PACK | Status: DC | PRN

## 2014-05-25 MED ORDER — FREESTYLE LANCETS MISC: Status: DC

## 2014-05-25 MED ORDER — SODIUM CHLORIDE 0.9 % IV BOLUS (SEPSIS)
500.0000 mL | Freq: Once | INTRAVENOUS | Status: AC
  Administered 2014-05-25: 05:00:00 via INTRAVENOUS

## 2014-05-25 MED ORDER — ATORVASTATIN CALCIUM 20 MG PO TABS: 20.0000 mg | ORAL_TABLET | Freq: Every day | ORAL | Status: DC

## 2014-05-25 MED ORDER — INSULIN PEN NEEDLE 29G X 12MM MISC: Status: DC

## 2014-05-25 NOTE — Progress Notes (Signed)
CARE MANAGEMENT NOTE 05/25/2014  Patient:  Krista Campbell, Krista Campbell   Account Number:  [id number]  Date Initiated:  05/20/2014  Documentation initiated by:  Marvetta Gibbons  Subjective/Objective Assessment:   Pt admitted with DKA     Action/Plan:   PTA pt lived at home- [facility]- arranged appointment with [facility]- for 05/21/14-will need to be changed due to pt being in [place]- call pending   Anticipated DC Date:  05/25/2014   Anticipated DC Plan:  HOME/SELF CARE  In-house referral  [place]  CM consult  [facility]      Choice offered to / List presented to:             Status of service:  Completed, signed off Medicare Important Message given?  NO (If response is "NO", the following Medicare IM given date fields will be blank) Date Medicare IM given:   Medicare IM given by:   Date Additional Medicare IM given:   Additional Medicare IM given by:    Discharge Disposition:    Per UR Regulation:  Reviewed for med. necessity/level of care/duration of stay  If discussed at [place] of Stay Meetings, dates discussed:    Comments:  05/25/2014  [place], [place] Met with patient to discuss medical follow up post discharge and medications. [facility] appointment:  06/01/2014 at 4:15 with NP who will go over diabetic management advised the insulin medications can be filled at [facility], pharmacy closes at 6:00 pm, need to be there prior to that time. She verbalized understanding.  05/21/14- [place] RN, BSN [phone] Spoke with pt at bedside- regarding PCP needs- pt had appointment for today at [facility]- have changed that appointment to next Tues. Oct. 13 at 9:00 am- this info given to pt- and placed on d/c info- pt will need glucose meter- prescription sheet placed on shadow chart for MD signature- NCM to continue to follow for any medication needs pt would be eligible for MATCH if needed

## 2014-05-25 NOTE — Discharge Instructions (Signed)

## 2014-05-25 NOTE — Progress Notes (Signed)
05/25/2014 10:02 AM  Pt BP recheck manually at this time is 84/50.  Pt remains asymptomatic.  Dr. Eliseo Squires notified.  No further orders received at this time.  Will monitor. Princella Pellegrini

## 2014-05-25 NOTE — Progress Notes (Addendum)
05/25/2014 10:02 AM  Pt BP recheck at 0900 87/59 via dynamap.  Pt remains asymptomatic.  Dr. Eliseo Squires notified.  No further orders received at this time. Will continue to monitor. Krista Campbell

## 2014-05-25 NOTE — Progress Notes (Signed)
05/25/2014 12:27 PM  Patient did not check her blood sugar at lunch time check, however, was able to verbalize the correct procedure for doing so to me.  Patient seems very comfortable and confident in being able to check her own blood sugars.  I also provided patient with education on how to administer insulin via the insulin pin utilizing her starter kit.  I demonstrated the procedure at first, then had her demonstrate it back to me, verbalizing each step as she went along.  Pt demonstrated correct administration of insulin twice.  Answered all questions appropriately, patient currently verbalizes understanding of how to use insulin pen.  Pt has also completed videos on Diabetes #1-5, #8, and #11.  Pt currently is in the process of watching the remaining three.  We have also reviewed the information on the insulin pen, and diabetes and nutrition.  Pt seems very eager to learn and seems to have a good grasp at this point on how to check her sugars and administer insulin--the pen seems to be a good fit for her over the syringes.  Will continue to monitor patient. Princella Pellegrini

## 2014-05-25 NOTE — Progress Notes (Signed)
Inpatient Diabetes Program Recommendations  AACE/ADA: New Consensus Statement on Inpatient Glycemic Control (2013)  Target Ranges:  Prepandial:   less than 140 mg/dL      Peak postprandial:   less than 180 mg/dL (1-2 hours)      Critically ill patients:  140 - 180 mg/dL   Requested per RN to assist with insulin instruction for d/c. Pt has appt with [place] next week-Pt scheduled. Pt to go to the pharmacy by 5:00 pm today to get her 70/30  Insulin. The 70/30 dosage she is presently on is effective for control-(the correction has shown to drop her a bit low, thus recommend stick with the 18 units bid. She states she can eat breakfast by 9 am and give her insulin at that time. Then she will get home from work around 7-8 pm to get her pm dose with her dinner. Have gotten patient started on watching the videos appropriate for her-carb countng, meal planning, signs and symptoms of high and low gucose, insulin pen instruction.  Gave pt the insulin pen starter kit with a pen to practice. Will ask RN to assist with teaching using the starter kit and pen provided.Explained to pt as to establishing a daily plan for checking glucose, giving her 70/30 and meals. Written materials covering all has been given as well. Pt will need to get the ReliOn glucose meter and strips, best price at [facility] pt this info as well. DO will need to adjust discharge meds to reflect the need for novolog 70/30 mix at 18 units bid without need for correction. Glad to assist as needed further. Thank you, Rosita Kea, RN, CNS, Diabetes Coordinator [phone])

## 2014-05-25 NOTE — Discharge Summary (Signed)
Physician Discharge Summary  Krista Campbell MRN:[id number] DOB: 08/25/1970 DOA: 05/19/2014  PCP: No PCP Per Patient  Admit date: 05/19/2014 Discharge date: 05/25/2014  Time spent: 35 minutes  Recommendations for Outpatient Follow-up:  1. FLP 6 weeks; LFTs 6 weeks 2. Monitor blood sugars  Discharge Diagnoses:  Principal Problem:   DKA (diabetic ketoacidoses) Active Problems:   Hyponatremia   Hypokalemia   DM (diabetes mellitus)   Dehydration   HLD (hyperlipidemia)   Hypomagnesemia   Discharge Condition: improved  Diet recommendation: diabetic  Filed Weights   05/19/14 2205 05/24/14 0152 05/24/14 2005  Weight: 81.6 kg (179 lb 14.3 oz) 85.14 kg (187 lb 11.2 oz) 85.73 kg (189 lb)    History of present illness:  Krista Campbell is a 43 y.o. female with no significant PMH presents to the ER with the above complaints.  She was in her usual state of health till about 2 weeks ago when she noticed, polyuria, polydipsia, and some polyphagia too.  In addition also noticed heartburn and some vaginal discharge which she attributed to polyuria and toilet paper.  In ER noted to have Hyperglycemia, no H/o DM, anion gap metabolic acidosis, hypokalemia   Hospital Course:  DKA (diabetic ketoacidoses)/New DM uncontrolled  -new DM uncontrolled  -DKA resolved  -70/30 as patient has no insurnace  -10/7 hemoglobin A1c = 13.6  -Patient has been seen by DM coordinator and dietitian.   HLD  -Not within ADA guideline start Lipitor 20 mg   Hypotension  -Although patient asymptomatic   Hyponatremia  -Resolved   Hypokalemia  -Replace   Hypomagnesemia  -Replace   Trichomoniasis vaginitis  - given metronidazole 2 g mx1,  -GC negative, HIV negative  - Dr. Woods discussed with patient that Sexual partner will need Rx too      Procedures:    Consultations:  Diabetic coordinator  Discharge Exam: Filed Vitals:   05/25/14 1219  BP: 86/49  Pulse: 104  Temp:   Resp: 18     General: A+Ox3, NAD Cardiovascular: rrr Respiratory: clear  Discharge Instructions You were cared for by a hospitalist during your hospital stay. If you have any questions about your discharge medications or the care you received while you were in the hospital after you are discharged, you can call the unit and asked to speak with the hospitalist on call if the hospitalist that took care of you is not available. Once you are discharged, your primary care physician will handle any further medical issues. Please note that NO REFILLS for any discharge medications will be authorized once you are discharged, as it is imperative that you return to your primary care physician (or establish a relationship with a primary care physician if you do not have one) for your aftercare needs so that they can reassess your need for medications and monitor your lab values.  Discharge Instructions   Diet Carb Modified    Complete by:  As directed      Discharge instructions    Complete by:  As directed   Check BS and record to bring to PCP- some fasting, some 2 hours after meal BMP 1 week     Increase activity slowly    Complete by:  As directed           Current Discharge Medication List    START taking these medications   Details  atorvastatin (LIPITOR) 20 MG tablet Take 1 tablet (20 mg total) by mouth daily at 6 PM. Qty: 30   tablet, Refills: 0    glucose blood (EQL TRUETEST TEST) test strip Use as instructed Qty: 100 each, Refills: 12    glucose monitoring kit (FREESTYLE) monitoring kit 1 each by Does not apply route as needed for other. Qty: 1 each, Refills: 0    Insulin Aspart Prot & Aspart (NOVOLOG 70/30 MIX) (70-30) 100 UNIT/ML Pen Inject 18 Units into the skin 2 (two) times daily. Qty: 15 mL, Refills: 11    Insulin Pen Needle (RELION PEN NEEDLES) 29G X 12MM MISC For BID doses Qty: 100 each, Refills: 0    Lancets (FREESTYLE) lancets Use as instructed Qty: 100 each, Refills: 12       CONTINUE these medications which have NOT CHANGED   Details  hydrocortisone cream 0.5 % Apply 1 application topically as needed for itching.       No Known Allergies Follow-up Information   Follow up with [facility]     On 06/01/2014. (appointment at 4:15 pm )    Contact information:   [address] [place]  [address] [phone]       The results of significant diagnostics from this hospitalization (including imaging, microbiology, ancillary and laboratory) are listed below for reference.    Significant Diagnostic Studies: No results found.  Microbiology: Recent Results (from the past 240 hour(s))  GC/CHLAMYDIA PROBE AMP     Status: None   Collection Time    05/19/14 11:59 AM      Result Value Ref Range Status   CT Probe RNA NEGATIVE  NEGATIVE Final   GC Probe RNA NEGATIVE  NEGATIVE Final   Comment: (NOTE)                                                                                               **Normal Reference Range: Negative**          Assay performed using the Gen-Probe APTIMA COMBO2 (R) Assay.     Acceptable specimen types for this assay include APTIMA Swabs (Unisex,     endocervical, urethral, or vaginal), first void urine, and ThinPrep     liquid based cytology samples.     Performed at [facility]  [facility], [facility]     Status: Abnormal   Collection Time    05/19/14 11:59 AM      Result Value Ref Range Status   Yeast Wet Prep HPF POC NONE SEEN  NONE SEEN Final   Trich, Wet Prep FEW (*) NONE SEEN Final   Clue Cells Wet Prep HPF POC NONE SEEN  NONE SEEN Final   WBC, Wet Prep HPF POC FEW (*) NONE SEEN Final  MRSA PCR SCREENING     Status: None   Collection Time    05/20/14  1:09 AM      Result Value Ref Range Status   MRSA by PCR NEGATIVE  NEGATIVE Final   Comment:            The GeneXpert MRSA Assay (FDA     approved for NASAL specimens     only), is one component of a       comprehensive MRSA  colonization     surveillance program. It is not     intended to diagnose MRSA     infection nor to guide or     monitor treatment for     MRSA infections.  GC/CHLAMYDIA PROBE AMP     Status: None   Collection Time    05/21/14  2:30 AM      Result Value Ref Range Status   CT Probe RNA NEGATIVE  NEGATIVE Final   GC Probe RNA NEGATIVE  NEGATIVE Final   Comment: (NOTE)                                                                                               **Normal Reference Range: Negative**          Assay performed using the Gen-Probe APTIMA COMBO2 (R) Assay.     Acceptable specimen types for this assay include APTIMA Swabs (Unisex,     endocervical, urethral, or vaginal), first void urine, and ThinPrep     liquid based cytology samples.     Performed at [facility]: Basic Metabolic Panel:  Recent Labs Lab 05/20/14 1900  05/21/14 1300 05/22/14 0239 05/22/14 1450 05/24/14 0512 05/24/14 1845 05/25/14 0332  NA  --   < >  --  139 139 141 139 142  K  --   < > 2.8* 2.6* 3.0* 2.7* 4.2 3.8  CL  --   < >  --  103 102 102 100 105  CO2  --   < >  --  _0 GLUCOSE  --   < >  --  189* 200* 150* 171* 119*  BUN  --   < >  --  5* 3* 5* 6 6  CREATININE  --   < >  --  0.70 0.73 0.72 0.72 0.80  CALCIUM  --   < >  --  8.0* 7.9* 8.2* 8.6 8.4  MG 1.4*  --  1.2*  --  1.5 1.1*  --  1.4*  < > = values in this interval not displayed. Liver Function Tests:  Recent Labs Lab 05/20/14 0240  AST 14  ALT 12  ALKPHOS 62  BILITOT 1.1  PROT 7.4  ALBUMIN 3.5   No results found for this basename: LIPASE, AMYLASE,  in the last 168 hours No results found for this basename: AMMONIA,  in the last 168 hours CBC:  Recent Labs Lab 05/19/14 1155 05/20/14 0240 05/25/14 0332  WBC 8.4 7.9 8.7  NEUTROABS 5.4  --   --   HGB 13.1 11.6* 8.8*  HCT 37.7 32.3* 26.1*  MCV 79.5 77.3* 82.6  PLT 269 251 241   Cardiac Enzymes: No results found for this basename: CKTOTAL,  CKMB, CKMBINDEX, TROPONINI,  in the last 168 hours BNP: BNP (last 3 results) No results found for this basename: PROBNP,  in the last 8760 hours CBG:  Recent Labs Lab 05/24/14 2002 05/25/14 05/25/14 0331 05/25/14 0733 05/25/14 1209  GLUCAP 131* 79 122* 104* 89  Signed:  VANN, JESSICA  [facility] 05/25/2014, 2:08 PM    

## 2014-05-25 NOTE — Progress Notes (Signed)
05/25/2014 9:37 AM  Pt not ready to watch any more of the diabetic education videos at this time.  I instructed her to let me know when she will be ready, and that I would continue to offer throughout the day.  Pt did draw up her on insulin this morning and self-administered it, however, she still needed some reinforcement and some assistance with it.  Pt asked questions appropriately and seems very willing to learn, just needs some more practice.  Will have patient check her own blood sugar and administer insulin at lunch as well.  Will monitor. Princella Pellegrini

## 2014-05-25 NOTE — Progress Notes (Signed)
05/25/2014 1:23 PM  Ms. Maddy has completed viewing the diabetic education videos.  I went over discharge information with her and answered all questions she had appropriately.  No further questions at this time.  IVs removed.  Pt verbalizes the importance of filling prescriptions today for her medications/glucometer supplies and verbalizes when to check sugars and when her doses of insulin are due.  Pt attempting to contact a ride at the moment.  Will follow. Princella Pellegrini

## 2014-06-01 ENCOUNTER — Encounter: Payer: Self-pay | Admitting: Internal Medicine

## 2014-06-01 ENCOUNTER — Ambulatory Visit: Payer: Self-pay | Attending: Internal Medicine | Admitting: Internal Medicine

## 2014-06-01 VITALS — BP 109/74 | HR 81 | Temp 98.5°F | Resp 16 | Ht 61.0 in | Wt 193.0 lb

## 2014-06-01 DIAGNOSIS — Z23 Encounter for immunization: Secondary | ICD-10-CM | POA: Insufficient documentation

## 2014-06-01 DIAGNOSIS — E11649 Type 2 diabetes mellitus with hypoglycemia without coma: Secondary | ICD-10-CM | POA: Insufficient documentation

## 2014-06-01 DIAGNOSIS — E1165 Type 2 diabetes mellitus with hyperglycemia: Secondary | ICD-10-CM | POA: Insufficient documentation

## 2014-06-01 DIAGNOSIS — Z794 Long term (current) use of insulin: Secondary | ICD-10-CM | POA: Insufficient documentation

## 2014-06-01 DIAGNOSIS — IMO0002 Reserved for concepts with insufficient information to code with codable children: Secondary | ICD-10-CM

## 2014-06-01 LAB — GLUCOSE, POCT (MANUAL RESULT ENTRY): POC GLUCOSE: 100 mg/dL — AB (ref 70–99)

## 2014-06-01 NOTE — Patient Instructions (Signed)
Switch to giving yourself 12 units of insulin twice per day.  If your blood sugar is below 100 wait two hours and recheck your sugar before giving the 12 units of insulin.  We will see you in 2 weeks, please bring blood sugar log.  Read materials given.

## 2014-06-01 NOTE — Progress Notes (Signed)
Patient ID: Krista Campbell, female   DOB: 1971-04-10, 43 y.o.   MRN: [id number]  [id number]  [id number]  DOB - Sep 21, 1970  CC:  Chief Complaint  Patient presents with  . Establish Care       HPI: Krista Campbell is a 43 y.o. female here today to establish medical care.  Patient present to clinic as a new diagnosis type 2 diabetes mellitus.  Patient was recently admitted for DKA.  She was discharged with insulin novolog 70/30 18 units twice per day.  She presents with a cbg log that reveals blood sugars from 57 to 177.  She has had two hypoglycemic events since discharge.  She has continued to give herself 18 units with a blood sugar of 80.  She has been eating three meals per day and avoiding high carb foods.  She reports that many of her symptoms of uncontrolled diabetes have improved since medication use.   Patient has No headache, No chest pain, No abdominal pain - No Nausea, No new weakness tingling or numbness, No Cough - SOB.  No Known Allergies Past Medical History  Diagnosis Date  . Diabetes mellitus without complication    Current Outpatient Prescriptions on File Prior to Visit  Medication Sig Dispense Refill  . atorvastatin (LIPITOR) 20 MG tablet Take 1 tablet (20 mg total) by mouth daily at 6 PM.  30 tablet  0  . glucose blood (EQL TRUETEST TEST) test strip Use as instructed  100 each  12  . glucose monitoring kit (FREESTYLE) monitoring kit 1 each by Does not apply route as needed for other.  1 each  0  . Insulin Aspart Prot & Aspart (NOVOLOG 70/30 MIX) (70-30) 100 UNIT/ML Pen Inject 18 Units into the skin 2 (two) times daily.  15 mL  11  . Insulin Pen Needle (RELION PEN NEEDLES) 29G X 12MM MISC For BID doses  100 each  0  . Lancets (FREESTYLE) lancets Use as instructed  100 each  12  . hydrocortisone cream 0.5 % Apply 1 application topically as needed for itching.       No current facility-administered medications on file prior to visit.   Family History    Problem Relation Age of Onset  . Kidney disease Father    History   Social History  . Marital Status: Single    Spouse Name: N/A    Number of Children: N/A  . Years of Education: N/A   Occupational History  . Not on file.   Social History Main Topics  . Smoking status: Never Smoker   . Smokeless tobacco: Never Used  . Alcohol Use: No  . Drug Use: No  . Sexual Activity: Not on file   Other Topics Concern  . Not on file   Social History Narrative  . No narrative on file    Review of Systems  Constitutional: Negative.   Eyes: Positive for blurred vision.  Cardiovascular: Positive for PND.  Gastrointestinal: Negative.   Genitourinary: Negative.   Neurological: Positive for headaches. Negative for dizziness and tingling.  Endo/Heme/Allergies: Negative for polydipsia.       Objective:   Filed Vitals:   06/01/14 1605  BP: 109/74  Pulse: 81  Temp: 98.5 F (36.9 C)  Resp: 16    Physical Exam: Constitutional: Patient appears well-developed and well-nourished. No distress. HENT: Normocephalic, atraumatic, External right and left ear normal. Oropharynx is clear and moist.  Eyes: Conjunctivae and EOM are normal. PERRLA, no scleral icterus. Neck:  Normal ROM. Neck supple. No JVD. No tracheal deviation. No thyromegaly. CVS: RRR, S1/S2 +, no murmurs, no gallops, no carotid bruit.  Pulmonary: Effort and breath sounds normal, no stridor, rhonchi, wheezes, rales.  Abdominal: Soft. BS +, no distension, tenderness, rebound or guarding.  Musculoskeletal: Normal range of motion. No edema and no tenderness.  Lymphadenopathy: No lymphadenopathy noted, cervical Neuro: Alert. Normal reflexes, muscle tone coordination. No cranial nerve deficit. Skin: Skin is warm and dry. No rash noted. Not diaphoretic. No erythema. No pallor. Psychiatric: Normal mood and affect. Behavior, judgment, thought content normal.  Lab Results  Component Value Date   WBC 8.7 05/25/2014   HGB 8.8*  05/25/2014   HCT 26.1* 05/25/2014   MCV 82.6 05/25/2014   PLT 241 05/25/2014   Lab Results  Component Value Date   CREATININE 0.80 05/25/2014   BUN 6 05/25/2014   NA 142 05/25/2014   K 3.8 05/25/2014   CL 105 05/25/2014   CO2 27 05/25/2014    Lab Results  Component Value Date   HGBA1C 13.6* 05/19/2014   Lipid Panel     Component Value Date/Time   CHOL 191 05/20/2014 1900   TRIG 116 05/20/2014 1900   HDL 44 05/20/2014 1900   CHOLHDL 4.3 05/20/2014 1900   VLDL 23 05/20/2014 1900   LDLCALC 124* 05/20/2014 1900       Assessment and plan:   Krista Campbell was seen today for establish care.  Diagnoses and associated orders for this visit:  DM (diabetes mellitus), type 2, uncontrolled - Glucose (CBG) - COMPLETE METABOLIC PANEL WITH GFR; Future - Lipid panel; Future - Amb Referral to [facility] - Ambulatory referral to Podiatry Patient given education on diabetes and several handouts to read regarding managing life and diet as a diabetic. Patient given time for questions and teach back  Need for prophylactic vaccination and inoculation against influenza - Flu Vaccine QUAD 36+ mos PF IM (Fluarix Quad PF)   Return in about 2 weeks (around 06/15/2014) for Lab Visit and RN visit-cbg check and 3 mo PCP.      Chari Manning, [facility] [phone] 06/07/2014, 10:41 AM

## 2014-06-01 NOTE — Progress Notes (Signed)
Pt is here to establish care. Pt has recently been diagnosed with diabetes she is here to continue her care.

## 2014-06-08 ENCOUNTER — Encounter: Payer: Self-pay | Attending: Internal Medicine

## 2014-06-08 ENCOUNTER — Other Ambulatory Visit: Payer: Self-pay | Admitting: Internal Medicine

## 2014-06-08 VITALS — Ht 61.0 in | Wt 193.2 lb

## 2014-06-08 DIAGNOSIS — E119 Type 2 diabetes mellitus without complications: Secondary | ICD-10-CM | POA: Insufficient documentation

## 2014-06-08 DIAGNOSIS — Z794 Long term (current) use of insulin: Secondary | ICD-10-CM | POA: Insufficient documentation

## 2014-06-08 DIAGNOSIS — Z713 Dietary counseling and surveillance: Secondary | ICD-10-CM | POA: Insufficient documentation

## 2014-06-08 MED ORDER — INSULIN ASPART PROT & ASPART (70-30 MIX) 100 UNIT/ML PEN: 18.0000 [IU] | PEN_INJECTOR | Freq: Two times a day (BID) | SUBCUTANEOUS | Status: DC

## 2014-06-08 NOTE — Progress Notes (Signed)
Patient was seen on 06/08/14 for the first of a series of three diabetes self-management courses at [facility].  Patient Education Plan per assessed needs and concerns is to attend four course education program for Diabetes Self Management Education.  The following learning objectives were met by the patient during this class:  Describe diabetes  State some common risk factors for diabetes  Defines the role of glucose and insulin  Identifies type of diabetes and pathophysiology  Describe the relationship between diabetes and cardiovascular risk  State the members of the Healthcare Team  States the rationale for glucose monitoring  State when to test glucose  State their individual Target Range  State the importance of logging glucose readings  Describe how to interpret glucose readings  Identifies A1C target  Explain the correlation between A1c and eAG values  State symptoms and treatment of high blood glucose  State symptoms and treatment of low blood glucose  Explain proper technique for glucose testing  Identifies proper sharps disposal  Handouts given during class include:  Living Well with Diabetes book  Carb Counting and Meal Planning book  Meal Plan Card  Carbohydrate guide  Meal planning worksheet  Low Sodium Flavoring Tips  The diabetes portion plate  E7N to eAG Conversion Chart  Diabetes Medications  Diabetes Recommended Care Schedule  Support Group  Diabetes Success Plan  Core Class Satisfaction Survey  Follow-Up Plan:  Attend core 2

## 2014-06-14 ENCOUNTER — Telehealth: Payer: Self-pay | Admitting: Internal Medicine

## 2014-06-14 NOTE — Telephone Encounter (Signed)
Patient is calling in today to see if she needs fast from her insulin medication because she has some appointments in the morning that require her to fast; please f/u with patient about this inquiry

## 2014-06-15 ENCOUNTER — Encounter: Payer: Self-pay | Attending: Internal Medicine

## 2014-06-15 ENCOUNTER — Telehealth: Payer: Self-pay | Admitting: Emergency Medicine

## 2014-06-15 ENCOUNTER — Ambulatory Visit: Payer: Self-pay | Attending: Internal Medicine

## 2014-06-15 ENCOUNTER — Ambulatory Visit: Payer: Self-pay | Attending: Internal Medicine | Admitting: *Deleted

## 2014-06-15 DIAGNOSIS — Z794 Long term (current) use of insulin: Secondary | ICD-10-CM | POA: Insufficient documentation

## 2014-06-15 DIAGNOSIS — E1165 Type 2 diabetes mellitus with hyperglycemia: Secondary | ICD-10-CM

## 2014-06-15 DIAGNOSIS — Z713 Dietary counseling and surveillance: Secondary | ICD-10-CM | POA: Insufficient documentation

## 2014-06-15 DIAGNOSIS — R739 Hyperglycemia, unspecified: Secondary | ICD-10-CM | POA: Insufficient documentation

## 2014-06-15 DIAGNOSIS — IMO0002 Reserved for concepts with insufficient information to code with codable children: Secondary | ICD-10-CM

## 2014-06-15 DIAGNOSIS — E119 Type 2 diabetes mellitus without complications: Secondary | ICD-10-CM | POA: Insufficient documentation

## 2014-06-15 LAB — COMPLETE METABOLIC PANEL WITH GFR
ALT: 15 U/L (ref 0–35)
AST: 21 U/L (ref 0–37)
Albumin: 3.9 g/dL (ref 3.5–5.2)
Alkaline Phosphatase: 73 U/L (ref 39–117)
BUN: 7 mg/dL (ref 6–23)
CALCIUM: 9.5 mg/dL (ref 8.4–10.5)
CO2: 27 meq/L (ref 19–32)
CREATININE: 0.72 mg/dL (ref 0.50–1.10)
Chloride: 104 mEq/L (ref 96–112)
GFR, Est Non African American: >89 mL/min
Glucose, Bld: 101 mg/dL — ABNORMAL HIGH (ref 70–99)
POTASSIUM: 4.1 meq/L (ref 3.5–5.3)
Sodium: 141 mEq/L (ref 135–145)
TOTAL PROTEIN: 6.8 g/dL (ref 6.0–8.3)
Total Bilirubin: 0.5 mg/dL (ref 0.2–1.2)

## 2014-06-15 LAB — LIPID PANEL
Cholesterol: 141 mg/dL (ref 0–200)
HDL: 56 mg/dL (ref >39)
LDL CALC: 73 mg/dL (ref 0–99)
TRIGLYCERIDES: 62 mg/dL (ref <150)
Total CHOL/HDL Ratio: 2.5 Ratio
VLDL: 12 mg/dL (ref 0–40)

## 2014-06-15 LAB — GLUCOSE, POCT (MANUAL RESULT ENTRY): POC GLUCOSE: 141 mg/dL — AB (ref 70–99)

## 2014-06-15 MED ORDER — GLUCOSE BLOOD VI STRP: ORAL_STRIP | Status: DC

## 2014-06-15 MED ORDER — INSULIN PEN NEEDLE 29G X 12MM MISC: Status: DC

## 2014-06-15 NOTE — Progress Notes (Signed)
Patient presents for CBG and log review Patient brought glucometer Over last 2 weeks AM fasting blood sugars have ranged 61-112 Patient states that she holds AM dose of Novolin 70/30 if fasting blood sugar is < 100 as directed by PCP PM blood sugars have ranged 138-195  Patient states that she has made healthy diet changes and will be starting exercise in near future  Per PCP: Decrease Novolin insulin 70/30 to 10 units bid If AM fasting blood sugar is < 100 may take AM dose of 70/30 insulin prior to lunch at 11:30 if CBG > 100 at that time   Rx given for insulin pen needles and testing strips

## 2014-06-15 NOTE — Telephone Encounter (Signed)
Left message that pt can take insulin injection with fasting labs

## 2014-06-15 NOTE — Progress Notes (Signed)

## 2014-06-16 ENCOUNTER — Telehealth: Payer: Self-pay | Admitting: Internal Medicine

## 2014-06-16 NOTE — Telephone Encounter (Signed)
Patient states she received a call from "[employer]" in reference to a request sent by Dr Doreene Burke for Novolog, patient states she was informed by representative that her income was missing. Patient needs clairfication on the reason for the call, and whether or not she needs to provide more income. Please follow up with patient to confirm.

## 2014-06-22 DIAGNOSIS — E119 Type 2 diabetes mellitus without complications: Secondary | ICD-10-CM

## 2014-06-24 NOTE — Progress Notes (Signed)
Patient was seen on 06/23/14 for the third of a series of three diabetes self-management courses at the [facility]. The following learning objectives were met by the patient during this class:  . State the amount of activity recommended for healthy living . Describe activities suitable for individual needs . Identify ways to regularly incorporate activity into daily life . Identify barriers to activity and ways to over come these barriers  Identify diabetes medications being personally used and their primary action for lowering glucose and possible side effects . Describe role of stress on blood glucose and develop strategies to address psychosocial issues . Identify diabetes complications and ways to prevent them  Explain how to manage diabetes during illness . Evaluate success in meeting personal goal . Establish 2-3 goals that they will plan to diligently work on until they return for the  77-monthfollow-up visit  Goals:   I will count my carb choices at most meals and snacks  I will be active 30 minutes or more 2 times a week  I will take my diabetes medications as scheduled  I will eat less starch  I will test my glucose at least 3 times a day, 7 days a week  I will look at patterns in my record book at least 4 days a month  Monitor my meal plan  To help manage stress I will Find a relaxing stress free activity  Your patient has identified these potential barriers to change:  aggrevation  Your patient has identified their diabetes self-care support plan as  Family Support On-line Resources  Plan:  Attend Core 4 in 4 months

## 2014-06-25 ENCOUNTER — Telehealth: Payer: Self-pay | Admitting: Emergency Medicine

## 2014-06-25 ENCOUNTER — Telehealth: Payer: Self-pay | Admitting: Internal Medicine

## 2014-06-25 NOTE — Telephone Encounter (Signed)
Please contact the patient about the latest lab results

## 2014-06-25 NOTE — Telephone Encounter (Signed)
Left message for pt to call nurse line regarding questions

## 2014-06-30 ENCOUNTER — Telehealth: Payer: Self-pay | Admitting: Emergency Medicine

## 2014-06-30 MED ORDER — ATORVASTATIN CALCIUM 20 MG PO TABS: 20.0000 mg | ORAL_TABLET | Freq: Every day | ORAL | Status: DC

## 2014-06-30 MED ORDER — INSULIN ASPART PROT & ASPART (70-30 MIX) 100 UNIT/ML PEN: 10.0000 [IU] | PEN_INJECTOR | Freq: Two times a day (BID) | SUBCUTANEOUS | Status: DC

## 2014-06-30 NOTE — Telephone Encounter (Signed)
Pt given normal lab results 

## 2014-06-30 NOTE — Progress Notes (Signed)
Pt given lab results States when checking AM blood sugars, CBGs reading low 100's, therefore she hold injection Pt is wondering is it ok to only take injections once a day if below 100 Will route message to [facility]

## 2014-07-06 ENCOUNTER — Telehealth: Payer: Self-pay | Admitting: Emergency Medicine

## 2014-07-06 NOTE — Telephone Encounter (Signed)
Pt called in wanting to know why insulin pens were changed to vials. States she was never told by doctor or pharmacy Please f/u

## 2014-07-07 ENCOUNTER — Telehealth: Payer: Self-pay | Admitting: Emergency Medicine

## 2014-07-07 NOTE — Telephone Encounter (Signed)
Not sure, but you may change if needed.

## 2014-07-20 ENCOUNTER — Ambulatory Visit: Payer: Self-pay | Attending: Internal Medicine

## 2014-09-02 ENCOUNTER — Encounter: Payer: Self-pay | Admitting: Internal Medicine

## 2014-09-02 ENCOUNTER — Ambulatory Visit: Payer: Self-pay | Attending: Internal Medicine | Admitting: Internal Medicine

## 2014-09-02 VITALS — BP 105/73 | HR 84 | Temp 98.3°F | Resp 16 | Ht 61.0 in | Wt 194.0 lb

## 2014-09-02 DIAGNOSIS — E785 Hyperlipidemia, unspecified: Secondary | ICD-10-CM | POA: Insufficient documentation

## 2014-09-02 DIAGNOSIS — Z794 Long term (current) use of insulin: Secondary | ICD-10-CM | POA: Insufficient documentation

## 2014-09-02 DIAGNOSIS — E119 Type 2 diabetes mellitus without complications: Secondary | ICD-10-CM | POA: Insufficient documentation

## 2014-09-02 LAB — POCT GLYCOSYLATED HEMOGLOBIN (HGB A1C): Hemoglobin A1C: 6.1

## 2014-09-02 LAB — GLUCOSE, POCT (MANUAL RESULT ENTRY): POC GLUCOSE: 87 mg/dL (ref 70–99)

## 2014-09-02 MED ORDER — ATORVASTATIN CALCIUM 20 MG PO TABS: 20.0000 mg | ORAL_TABLET | Freq: Every day | ORAL | Status: DC

## 2014-09-02 MED ORDER — GLUCOSE BLOOD VI STRP: ORAL_STRIP | Status: DC

## 2014-09-02 MED ORDER — INSULIN PEN NEEDLE 29G X 12MM MISC: Status: DC

## 2014-09-02 MED ORDER — INSULIN ASPART PROT & ASPART (70-30 MIX) 100 UNIT/ML PEN: PEN_INJECTOR | SUBCUTANEOUS | Status: DC

## 2014-09-02 NOTE — Progress Notes (Signed)
Patient ID: Krista Campbell, female   DOB: [birth year], 44 y.o.   MRN: [id number]   SUBJECTIVE: 44 y.o. female for follow up of diabetes. Diabetic Review of Systems - medication compliance: compliant all of the time, diabetic diet compliance: compliant all of the time, home glucose monitoring: is performed regularly, fasting values range 64-139, further diabetic ROS: no polyuria or polydipsia, no chest pain, dyspnea or TIA's, no numbness, tingling or pain in extremities.  Other symptoms and concerns: She reports that she fell over a child last week and has since been having occasional knee pain.    Current Outpatient Prescriptions  Medication Sig Dispense Refill  . atorvastatin (LIPITOR) 20 MG tablet Take 1 tablet (20 mg total) by mouth daily at 6 PM. 90 tablet 0  . glucose blood (EQL TRUETEST TEST) test strip Use as instructed 100 each 12  . glucose monitoring kit (FREESTYLE) monitoring kit 1 each by Does not apply route as needed for other. 1 each 0  . Insulin Aspart Prot & Aspart (NOVOLOG 70/30 MIX) (70-30) 100 UNIT/ML Pen Inject 10 Units into the skin 2 (two) times daily. 30 mL 3  . Lancets (FREESTYLE) lancets Use as instructed 100 each 12  . hydrocortisone cream 0.5 % Apply 1 application topically as needed for itching.    . Insulin Pen Needle (RELION PEN NEEDLES) 29G X 12MM MISC For BID doses (Patient not taking: Reported on 09/02/2014) 100 each 0   No current facility-administered medications for this visit.    OBJECTIVE: Appearance: alert, well appearing, and in no distress, oriented to person, place, and time and overweight. BP 105/73 mmHg  Pulse 84  Temp(Src) 98.3 F (36.8 C) (Oral)  Resp 16  Ht '5\' 1"'  (1.549 m)  Wt 194 lb (87.998 kg)  BMI 36.67 kg/m2  SpO2 97%  LMP 08/19/2014  Exam: heart sounds normal rate, regular rhythm, normal S1, S2, no murmurs, rubs, clicks or gallops, normal bilateral carotid upstroke without bruits, no JVD, chest clear, no carotid  bruits  ASSESSMENT: Diabetes Mellitus: improved  PLAN: See orders for this visit as documented in the electronic medical record. Issues reviewed with her: diabetic diet discussed in detail, written exchange diet given, low cholesterol diet, weight control and daily exercise discussed, home glucose monitoring emphasized and all medications, side effects and compliance discussed carefully.  Krista Campbell was seen today for follow-up.  Diagnoses and associated orders for this visit:  Type 2 diabetes mellitus without complication - Glucose (CBG) - HgB A1c - Ambulatory referral to Dentistry - glucose blood (EQL TRUETEST TEST) test strip; For BID doses - Insulin Pen Needle (RELION PEN NEEDLES) 29G X 12MM MISC; For BID doses - insulin aspart protamine - aspart (NOVOLOG 70/30 MIX) (70-30) 100 UNIT/ML FlexPen; Take 7 units in the morning and 10 units in the evening. Decrease AM dose due to hypoglycemia at lunch and dinner time - Microalbumin, urine  HLD (hyperlipidemia) - atorvastatin (LIPITOR) 20 MG tablet; Take 1 tablet (20 mg total) by mouth daily at 6 PM.   Return in about 3 months (around 12/02/2014) for Diabetes Mellitus.  Krista Manning, NP 09/02/2014 11:30 AM

## 2014-09-02 NOTE — Progress Notes (Signed)
Pt is here following up on her hyperlipidemia, depression and diabetes. Pt states that she fell over a child last week and now her knee has occasional pain.

## 2014-09-02 NOTE — Patient Instructions (Signed)
Diabetes and Standards of Medical Care Diabetes is complicated. You may find that your diabetes team includes a dietitian, nurse, diabetes educator, eye doctor, and more. To help everyone know what is going on and to help you get the care you deserve, the following schedule of care was developed to help keep you on track. Below are the tests, exams, vaccines, medicines, education, and plans you will need. HbA1c test This test shows how well you have controlled your glucose over the past 2-3 months. It is used to see if your diabetes management plan needs to be adjusted.   It is performed at least 2 times a year if you are meeting treatment goals.  It is performed 4 times a year if therapy has changed or if you are not meeting treatment goals. Blood pressure test  This test is performed at every routine medical visit. The goal is less than 140/90 mm Hg for most people, but 130/80 mm Hg in some cases. Ask your health care provider about your goal. Dental exam  Follow up with the dentist regularly. Eye exam  If you are diagnosed with type 1 diabetes as a child, get an exam upon reaching the age of 37 years or older and have had diabetes for 3-5 years. Yearly eye exams are recommended after that initial eye exam.  If you are diagnosed with type 1 diabetes as an adult, get an exam within 5 years of diagnosis and then yearly.  If you are diagnosed with type 2 diabetes, get an exam as soon as possible after the diagnosis and then yearly. Foot care exam  Visual foot exams are performed at every routine medical visit. The exams check for cuts, injuries, or other problems with the feet.  A comprehensive foot exam should be done yearly. This includes visual inspection as well as assessing foot pulses and testing for loss of sensation.  Check your feet nightly for cuts, injuries, or other problems with your feet. Tell your health care provider if anything is not healing. Kidney function test (urine  microalbumin)  This test is performed once a year.  Type 1 diabetes: The first test is performed 5 years after diagnosis.  Type 2 diabetes: The first test is performed at the time of diagnosis.  A serum creatinine and estimated glomerular filtration rate (eGFR) test is done once a year to assess the level of chronic kidney disease (CKD), if present. Lipid profile (cholesterol, HDL, LDL, triglycerides)  Performed every 5 years for most people.  The goal for LDL is less than 100 mg/dL. If you are at high risk, the goal is less than 70 mg/dL.  The goal for HDL is 40 mg/dL-50 mg/dL for men and 50 mg/dL-60 mg/dL for women. An HDL cholesterol of 60 mg/dL or higher gives some protection against heart disease.  The goal for triglycerides is less than 150 mg/dL. Influenza vaccine, pneumococcal vaccine, and hepatitis B vaccine  The influenza vaccine is recommended yearly.  It is recommended that people with diabetes who are over 24 years old get the pneumonia vaccine. In some cases, two separate shots may be given. Ask your health care provider if your pneumonia vaccination is up to date.  The hepatitis B vaccine is also recommended for adults with diabetes. Diabetes self-management education  Education is recommended at diagnosis and ongoing as needed. Treatment plan  Your treatment plan is reviewed at every medical visit. Document Released: 05/27/2009 Document Revised: 12/14/2013 Document Reviewed: 12/30/2012 [facility] Patient Information 2015 [place],  [employer]. This information is not intended to replace advice given to you by your health care provider. Make sure you discuss any questions you have with your health care provider.  

## 2014-09-03 LAB — MICROALBUMIN, URINE: Microalb, Ur: 2 mg/dL (ref <2.0)

## 2014-10-18 ENCOUNTER — Encounter: Payer: Self-pay | Attending: Internal Medicine

## 2014-10-18 DIAGNOSIS — E119 Type 2 diabetes mellitus without complications: Secondary | ICD-10-CM | POA: Insufficient documentation

## 2014-10-18 DIAGNOSIS — Z713 Dietary counseling and surveillance: Secondary | ICD-10-CM | POA: Insufficient documentation

## 2014-10-18 DIAGNOSIS — Z794 Long term (current) use of insulin: Secondary | ICD-10-CM | POA: Insufficient documentation

## 2014-10-18 NOTE — Progress Notes (Signed)
Appt start time: 0900 end time:  1000.  Patient was seen on 10/18/2014 for a review of the series of three diabetes self-management courses at the [facility]. The following learning objectives were met by the patient during this class:  . Reviewed blood glucose monitoring and interpretation including the recommended target ranges and Hgb A1c.  . Reviewed on carb counting, importance of regularly scheduled meals/snacks, and meal planning.  . Reviewed the effects of physical activity on glucose levels and long-term glucose control.  Recommended goal of 150 minutes of physical activity/week. . Reviewed patient medications and discussed role of medication on blood glucose and possible side effects. . Discussed strategies to manage stress, psychosocial issues, and other obstacles to diabetes management. . Encouraged moderate weight reduction to improve glucose levels.   . Reviewed short-term complications: hyper- and hypo-glycemia.  Discussed causes, symptoms, and treatment options. . Reviewed prevention, detection, and treatment of long-term complications.  Discussed the role of prolonged elevated glucose levels on body systems.  Goals:  Follow Diabetes Meal Plan as instructed  Eat 3 meals and 2 snacks, every 3-5 hrs  Limit carbohydrate intake to 45 grams carbohydrate/meal Limit carbohydrate intake to 15 grams carbohydrate/snack Add lean protein foods to meals/snacks  Monitor glucose levels as instructed by your doctor  Aim for goal of 15-30 mins of physical activity daily as tolerated  Bring food record and glucose log to your next nutrition visit

## 2014-11-30 ENCOUNTER — Telehealth: Payer: Self-pay | Admitting: Internal Medicine

## 2014-11-30 NOTE — Telephone Encounter (Signed)
Pt went for a free vision exam and is wanting to know her next steps. She was told she needed a referral from pcp since she has the orange card. Please follow up with patient.

## 2014-12-02 NOTE — Telephone Encounter (Signed)
I don't have referrals with the orange card for opthalmology  We do have for optometry but is minimum slots per month . I called patient and I was unable to leave a message thanks

## 2014-12-08 ENCOUNTER — Ambulatory Visit: Payer: Self-pay | Admitting: Internal Medicine

## 2015-01-17 ENCOUNTER — Telehealth: Payer: Self-pay | Admitting: Internal Medicine

## 2015-01-17 NOTE — Telephone Encounter (Signed)
Patient called to request a letter from her PCP stating what medications she takes and the needles she uses for her insulin in order to take it through security, [place]. Please f/u with pt.

## 2015-01-17 NOTE — Telephone Encounter (Signed)
Please give short letter with list of medications and I will sign. Thanks

## 2015-02-07 ENCOUNTER — Other Ambulatory Visit: Payer: Self-pay

## 2015-03-30 ENCOUNTER — Other Ambulatory Visit: Payer: Self-pay | Admitting: Internal Medicine

## 2015-04-11 ENCOUNTER — Ambulatory Visit: Payer: Self-pay | Attending: Internal Medicine | Admitting: Internal Medicine

## 2015-04-11 ENCOUNTER — Encounter: Payer: Self-pay | Admitting: Internal Medicine

## 2015-04-11 VITALS — BP 109/75 | HR 79 | Temp 98.0°F | Resp 16 | Ht 61.0 in | Wt 193.2 lb

## 2015-04-11 DIAGNOSIS — Z23 Encounter for immunization: Secondary | ICD-10-CM | POA: Insufficient documentation

## 2015-04-11 DIAGNOSIS — E119 Type 2 diabetes mellitus without complications: Secondary | ICD-10-CM | POA: Insufficient documentation

## 2015-04-11 DIAGNOSIS — Z794 Long term (current) use of insulin: Secondary | ICD-10-CM | POA: Insufficient documentation

## 2015-04-11 DIAGNOSIS — E785 Hyperlipidemia, unspecified: Secondary | ICD-10-CM | POA: Insufficient documentation

## 2015-04-11 LAB — POCT GLYCOSYLATED HEMOGLOBIN (HGB A1C): Hemoglobin A1C: 6.1

## 2015-04-11 LAB — GLUCOSE, POCT (MANUAL RESULT ENTRY): POC GLUCOSE: 131 mg/dL — AB (ref 70–99)

## 2015-04-11 MED ORDER — TRUE METRIX AIR GLUCOSE METER DEVI: 1.0000 | Freq: Three times a day (TID) | Status: DC

## 2015-04-11 MED ORDER — TETANUS-DIPHTH-ACELL PERTUSSIS 5-2.5-18.5 LF-MCG/0.5 IM SUSP
0.5000 mL | Freq: Once | INTRAMUSCULAR | Status: AC
  Administered 2015-04-11: 0.5 mL via INTRAMUSCULAR

## 2015-04-11 MED ORDER — INSULIN ASPART PROT & ASPART (70-30 MIX) 100 UNIT/ML PEN: PEN_INJECTOR | SUBCUTANEOUS | Status: DC

## 2015-04-11 MED ORDER — GLUCOSE BLOOD VI STRP: ORAL_STRIP | Status: DC

## 2015-04-11 MED ORDER — ATORVASTATIN CALCIUM 20 MG PO TABS: 20.0000 mg | ORAL_TABLET | Freq: Every day | ORAL | Status: DC

## 2015-04-11 NOTE — Progress Notes (Signed)
Patient ID: Krista Campbell, female   DOB: 1970-09-22, 44 y.o.   MRN: [id number] SUBJECTIVE: 44 y.o. female for follow up of diabetes. Diabetic Review of Systems - medication compliance: compliant all of the time, diabetic diet compliance: compliant all of the time, home glucose monitoring: is performed regularly, further diabetic ROS: no polyuria or polydipsia, no chest pain, dyspnea or TIA's, no numbness, tingling or pain in extremities, no unusual visual symptoms, no hypoglycemia, last eye exam approximately 1 year ago, scheduled for visit next month.  She denies chest pain, palpitations, SOB, claudications, or SOB.   Current Outpatient Prescriptions  Medication Sig Dispense Refill  . atorvastatin (LIPITOR) 20 MG tablet Take 1 tablet (20 mg total) by mouth daily at 6 PM. 90 tablet 1  . insulin aspart protamine - aspart (NOVOLOG 70/30 MIX) (70-30) 100 UNIT/ML FlexPen Take 7 units in the morning and 10 units in the evening 30 mL 3  . glucose blood (EQL TRUETEST TEST) test strip For BID doses 100 each 12  . glucose monitoring kit (FREESTYLE) monitoring kit 1 each by Does not apply route as needed for other. 1 each 0  . hydrocortisone cream 0.5 % Apply 1 application topically as needed for itching.    . Insulin Pen Needle (RELION PEN NEEDLES) 29G X 12MM MISC For BID doses 100 each 0  . Lancets (FREESTYLE) lancets Use as instructed 100 each 12   No current facility-administered medications for this visit.    OBJECTIVE: Appearance: alert, well appearing, and in no distress, oriented to person, place, and time and overweight. BP 109/75 mmHg  Pulse 79  Temp(Src) 98 F (36.7 C)  Resp 16  Ht _0  (1.549 m)  Wt 193 lb 3.2 oz (87.635 kg)  BMI 36.52 kg/m2  SpO2 100%  Exam: heart sounds normal rate, regular rhythm, normal S1, S2, no murmurs, rubs, clicks or gallops, no JVD, chest clear, no carotid bruits, feet: warm, good capillary refill, no trophic changes or ulcerative lesions and normal DP  and PT pulses  ASSESSMENT: Diabetes Mellitus: well controlled HLD: refill atorvastatin. Last LDL was in Nov 2015 which was 73--at goal. Stable currently Need Tdap vaccine: given in office, will repeat in 10 years   PLAN: See orders for this visit as documented in the electronic medical record. Issues reviewed with her: diabetic diet discussed in detail, written exchange diet given, low cholesterol diet, weight control and daily exercise discussed, all medications, side effects and compliance discussed carefully, foot care discussed and Podiatry visits discussed, annual eye examinations at Ophthalmology discussed and labs immediately prior to next visit.  Return in about 6 months (around 10/11/2015) for Diabetes Mellitus.  Lance Bosch, NP 04/18/2015 1:35 PM

## 2015-04-11 NOTE — Progress Notes (Signed)
Patient here for follow up on her diabetes And for medication refills

## 2015-05-09 ENCOUNTER — Ambulatory Visit: Payer: Self-pay | Attending: Internal Medicine

## 2015-05-12 ENCOUNTER — Ambulatory Visit: Payer: Self-pay | Admitting: Pharmacist

## 2015-05-17 ENCOUNTER — Ambulatory Visit: Payer: Self-pay | Attending: Internal Medicine

## 2015-05-17 DIAGNOSIS — Z0000 Encounter for general adult medical examination without abnormal findings: Secondary | ICD-10-CM | POA: Insufficient documentation

## 2015-06-21 LAB — HM DIABETES EYE EXAM

## 2015-07-06 ENCOUNTER — Other Ambulatory Visit: Payer: Self-pay | Admitting: Internal Medicine

## 2015-07-25 ENCOUNTER — Other Ambulatory Visit: Payer: Self-pay | Admitting: Internal Medicine

## 2015-08-18 MED FILL — ?ATORVASTATIN 20 MG TABLET: 20 | 30 days supply | Qty: 30 | Fill #3

## 2015-09-19 ENCOUNTER — Other Ambulatory Visit: Payer: Self-pay | Admitting: Internal Medicine

## 2015-09-19 MED FILL — BD INSUL SYR 0.5 ML 31GX15/: 31G X 15/64 | 25 days supply | Qty: 100 | Fill #0

## 2015-09-26 MED FILL — ATORVASTATIN 20 MG TABLET: 20 | 30 days supply | Qty: 30 | Fill #4

## 2015-09-27 MED FILL — !NOVOLOG MIX 70/30 VIAL: 70-30/ML | 28 days supply | Qty: 10 | Fill #0

## 2015-10-19 ENCOUNTER — Ambulatory Visit: Payer: Self-pay | Attending: Internal Medicine | Admitting: Internal Medicine

## 2015-10-19 ENCOUNTER — Encounter: Payer: Self-pay | Admitting: Internal Medicine

## 2015-10-19 VITALS — BP 106/71 | HR 83 | Temp 98.0°F | Resp 16 | Ht 61.0 in | Wt 191.0 lb

## 2015-10-19 DIAGNOSIS — E785 Hyperlipidemia, unspecified: Secondary | ICD-10-CM

## 2015-10-19 DIAGNOSIS — R252 Cramp and spasm: Secondary | ICD-10-CM

## 2015-10-19 DIAGNOSIS — D649 Anemia, unspecified: Secondary | ICD-10-CM

## 2015-10-19 DIAGNOSIS — E119 Type 2 diabetes mellitus without complications: Secondary | ICD-10-CM

## 2015-10-19 LAB — COMPLETE METABOLIC PANEL WITH GFR
ALBUMIN: 4.3 g/dL (ref 3.6–5.1)
ALK PHOS: 85 U/L (ref 33–115)
ALT: 23 U/L (ref 6–29)
AST: 24 U/L (ref 10–30)
BILIRUBIN TOTAL: 0.6 mg/dL (ref 0.2–1.2)
BUN: 11 mg/dL (ref 7–25)
CO2: 29 mmol/L (ref 20–31)
Calcium: 9.4 mg/dL (ref 8.6–10.2)
Chloride: 101 mmol/L (ref 98–110)
Creat: 0.91 mg/dL (ref 0.50–1.10)
GFR, EST AFRICAN AMERICAN: 89 mL/min (ref >=60)
GFR, EST NON AFRICAN AMERICAN: 77 mL/min (ref >=60)
Glucose, Bld: 107 mg/dL — ABNORMAL HIGH (ref 65–99)
Potassium: 3.8 mmol/L (ref 3.5–5.3)
Sodium: 138 mmol/L (ref 135–146)
TOTAL PROTEIN: 7.2 g/dL (ref 6.1–8.1)

## 2015-10-19 LAB — LIPID PANEL
CHOLESTEROL: 125 mg/dL (ref 125–200)
HDL: 57 mg/dL (ref >=46)
LDL Cholesterol: 59 mg/dL (ref <130)
TRIGLYCERIDES: 45 mg/dL (ref <150)
Total CHOL/HDL Ratio: 2.2 Ratio (ref <=5.0)
VLDL: 9 mg/dL (ref <30)

## 2015-10-19 LAB — CBC
HCT: 36.7 % (ref 36.0–46.0)
Hemoglobin: 11.8 g/dL — ABNORMAL LOW (ref 12.0–15.0)
MCH: 28.3 pg (ref 26.0–34.0)
MCHC: 32.2 g/dL (ref 30.0–36.0)
MCV: 88 fL (ref 78.0–100.0)
MPV: 9.4 fL (ref 8.6–12.4)
PLATELETS: 320 10*3/uL (ref 150–400)
RBC: 4.17 MIL/uL (ref 3.87–5.11)
RDW: 14.2 % (ref 11.5–15.5)
WBC: 4.9 10*3/uL (ref 4.0–10.5)

## 2015-10-19 LAB — POCT GLYCOSYLATED HEMOGLOBIN (HGB A1C): HEMOGLOBIN A1C: 5.8

## 2015-10-19 LAB — MAGNESIUM: Magnesium: 1.3 mg/dL — ABNORMAL LOW (ref 1.5–2.5)

## 2015-10-19 LAB — GLUCOSE, POCT (MANUAL RESULT ENTRY): POC Glucose: 98 mg/dl (ref 70–99)

## 2015-10-19 MED ORDER — METFORMIN HCL 500 MG PO TABS: 500.0000 mg | ORAL_TABLET | Freq: Two times a day (BID) | ORAL | Status: DC

## 2015-10-19 MED ORDER — ATORVASTATIN CALCIUM 20 MG PO TABS: 20.0000 mg | ORAL_TABLET | Freq: Every day | ORAL | Status: DC

## 2015-10-19 MED FILL — ?METFORMIN HCL 500MG TABLET: 500 | 30 days supply | Qty: 60 | Fill #0

## 2015-10-19 MED FILL — ?ATORVASTATIN 20 MG TABLET: 20 | 30 days supply | Qty: 30 | Fill #0

## 2015-10-19 NOTE — Patient Instructions (Signed)
Please call Sabrina Holland, [phone],  with the BCCCP (breast and cervical cancer control program) at the [facility] to set up an appointment to verify eligibility for a breast exam, mammogram, ultrasound. If you qualify this will be set up at [facility].   

## 2015-10-19 NOTE — Progress Notes (Signed)
Patient here for follow up on her diabetes and cholesterol 

## 2015-10-19 NOTE — Progress Notes (Signed)
Patient ID: Krista Campbell, female   DOB: 12/19/1970, 45 y.o.   MRN: [id number] SUBJECTIVE: 45 y.o. female for follow up of diabetes. Diabetic Review of Systems - medication compliance: compliant all of the time, diabetic diet compliance: compliant all of the time, further diabetic ROS: no polyuria or polydipsia, no chest pain, dyspnea or TIA's, no numbness, tingling or pain in extremities, no unusual visual symptoms, no hypoglycemia, last eye exam approximately 4 months ago.  She is not up to date on her mammogram or pap smear.  Current Outpatient Prescriptions  Medication Sig Dispense Refill  . atorvastatin (LIPITOR) 20 MG tablet Take 1 tablet (20 mg total) by mouth daily at 6 PM. 90 tablet 1  . insulin aspart protamine - aspart (NOVOLOG 70/30 MIX) (70-30) 100 UNIT/ML FlexPen Take 7 units in the morning and 10 units in the evening 30 mL 3  . BD INSULIN SYRINGE ULTRAFINE 31G X 15/64" 0.5 ML MISC USE AS DIRECTED 100 each 12  . Blood Glucose Monitoring Suppl (TRUE METRIX AIR GLUCOSE METER) DEVI 1 Device by Does not apply route 4 (four) times daily - after meals and at bedtime. 1 Device 0  . glucose blood (TRUE METRIX BLOOD GLUCOSE TEST) test strip Use as instructed 100 each 12  . hydrocortisone cream 0.5 % Apply 1 application topically as needed for itching.    . insulin aspart protamine- aspart (NOVOLOG MIX 70/30) (70-30) 100 UNIT/ML injection Inject 18 units under the skin 2 times a day. Please schedule DR APPT for further refills. 10 mL 0  . Insulin Pen Needle (RELION PEN NEEDLES) 29G X 12MM MISC For BID doses 100 each 0  . Lancets (FREESTYLE) lancets Use as instructed 100 each 12   No current facility-administered medications for this visit.   Review of Systems: Other than what is stated in HPI, all other systems are negative.   OBJECTIVE: Appearance: alert, well appearing, and in no distress, oriented to person, place, and time and overweight. BP 106/71 mmHg  Pulse 83  Temp(Src) 98 F  (36.7 C)  Resp 16  Ht 5\' 1"  (1.549 m)  Wt 191 lb (86.637 kg)  BMI 36.11 kg/m2  SpO2 100%  Exam: heart sounds normal rate, regular rhythm, normal S1, S2, no murmurs, rubs, clicks or gallops, no JVD, chest clear, no carotid bruits, feet: warm, good capillary refill, no trophic changes or ulcerative lesions, normal DP and PT pulses, normal monofilament exam and normal sensory exam  ASSESSMENT: Rena was seen today for follow-up.  Diagnoses and all orders for this visit:  Type 2 diabetes mellitus without complication, without long-term current use of insulin (HCC) -     Glucose (CBG) -     HgB A1c -    Begin metFORMIN (GLUCOPHAGE) 500 MG tablet; Take 1 tablet (500 mg total) by mouth 2 (two) times daily with a meal. -     Ambulatory referral to Dentistry -     COMPLETE METABOLIC PANEL WITH GFR Patient is very well controlled and states that she often does not require insulin because her sugars are below 100 on each check. I will discontinue insulin and start her on Metformin---patient is unable to swallow pills but states that she will try it in applesauce. We will begin low dose for now with option to increase if needed.   HLD (hyperlipidemia) -     atorvastatin (LIPITOR) 20 MG tablet; Take 1 tablet (20 mg total) by mouth daily at 6 PM. -  Lipid panel Education provided on proper lifestyle changes in order to lower cholesterol. Patient advised to maintain healthy weight and to keep total fat intake at 25-35% of total calories and carbohydrates 50-60% of total daily calories. Explained how high cholesterol places patient at risk for heart disease. Patient placed on appropriate medication and repeat labs in 6 months   Cramps of right lower extremity -     Magnesium  Anemia, unspecified anemia type -     CBC Last cbc was in 2015 with a low hemoglobin a1c, I will recheck today.    PLAN: See orders for this visit as documented in the electronic medical record. Issues reviewed with  her: diabetic diet discussed in detail, written exchange diet given, low cholesterol diet, weight control and daily exercise discussed, foot care discussed and Podiatry visits discussed and long term diabetic complications discussed.  Return in about 1 week (around 10/26/2015) for pap smear and 3 mo PCP .  Lance Bosch, NP 10/19/2015 10:22 AM

## 2015-10-26 ENCOUNTER — Telehealth: Payer: Self-pay | Admitting: Internal Medicine

## 2015-10-26 ENCOUNTER — Telehealth: Payer: Self-pay

## 2015-10-26 NOTE — Telephone Encounter (Signed)
Pt. Called requesting to speak to the nurse regarding her lab results. Pt. Saw her results in my chart. Please f/u with pt.

## 2015-10-26 NOTE — Telephone Encounter (Signed)
Returned patient phone call Patient was inquiring about her test results Please review and advise thanks

## 2015-11-01 ENCOUNTER — Ambulatory Visit: Payer: Self-pay | Attending: Internal Medicine

## 2015-11-01 ENCOUNTER — Encounter: Payer: Self-pay | Admitting: Clinical

## 2015-11-01 ENCOUNTER — Ambulatory Visit (HOSPITAL_BASED_OUTPATIENT_CLINIC_OR_DEPARTMENT_OTHER): Payer: Self-pay | Admitting: Internal Medicine

## 2015-11-01 ENCOUNTER — Encounter: Payer: Self-pay | Admitting: Internal Medicine

## 2015-11-01 VITALS — BP 109/71 | HR 90 | Temp 98.0°F | Resp 16 | Ht 61.0 in | Wt 192.0 lb

## 2015-11-01 DIAGNOSIS — E119 Type 2 diabetes mellitus without complications: Secondary | ICD-10-CM | POA: Insufficient documentation

## 2015-11-01 DIAGNOSIS — Z794 Long term (current) use of insulin: Secondary | ICD-10-CM | POA: Insufficient documentation

## 2015-11-01 DIAGNOSIS — Z124 Encounter for screening for malignant neoplasm of cervix: Secondary | ICD-10-CM

## 2015-11-01 DIAGNOSIS — Z7984 Long term (current) use of oral hypoglycemic drugs: Secondary | ICD-10-CM | POA: Insufficient documentation

## 2015-11-01 DIAGNOSIS — Z01419 Encounter for gynecological examination (general) (routine) without abnormal findings: Secondary | ICD-10-CM | POA: Insufficient documentation

## 2015-11-01 DIAGNOSIS — Z79899 Other long term (current) drug therapy: Secondary | ICD-10-CM | POA: Insufficient documentation

## 2015-11-01 DIAGNOSIS — E785 Hyperlipidemia, unspecified: Secondary | ICD-10-CM | POA: Insufficient documentation

## 2015-11-01 MED ORDER — MAGNESIUM OXIDE 400 MG PO TABS: 400.0000 mg | ORAL_TABLET | Freq: Every day | ORAL | Status: DC

## 2015-11-01 MED FILL — Magnesium Oxide 400mg table: 5 days supply | Qty: 5 | Fill #0

## 2015-11-01 NOTE — Patient Instructions (Signed)
Labs normal except low magnesium again. I have sent 5 tablets to take. This should correct the problem.

## 2015-11-01 NOTE — Progress Notes (Signed)
Patient here for pap only. 

## 2015-11-01 NOTE — Progress Notes (Signed)
Patient ID: [name], female   DOB: [birth year], 45 y.o.   MRN: [id number]  CC: GYN exam  HPI: Krista Campbell is a 45 y.o. female here today for a gynecological exam.  Patient has past medical history of diabetes and HLD. Patient reports that she has never had a mammogram and has not had a pap smear in several years. She denies vaginal discharge, itch, odor, or lesions. She would like STD testing today. No other complaints at this time.   Patient has No headache, No chest pain, No abdominal pain - No Nausea, No new weakness tingling or numbness, No Cough - SOB.  No Known Allergies Past Medical History  Diagnosis Date  . Diabetes mellitus without complication ([place])   . Hyperlipidemia    Current Outpatient Prescriptions on File Prior to Visit  Medication Sig Dispense Refill  . atorvastatin (LIPITOR) 20 MG tablet Take 1 tablet (20 mg total) by mouth daily at 6 PM. 90 tablet 1  . BD INSULIN SYRINGE ULTRAFINE 31G X 15/64" 0.5 ML MISC USE AS DIRECTED 100 each 12  . Blood Glucose Monitoring Suppl (TRUE METRIX AIR GLUCOSE METER) DEVI 1 Device by Does not apply route 4 (four) times daily - after meals and at bedtime. 1 Device 0  . glucose blood (TRUE METRIX BLOOD GLUCOSE TEST) test strip Use as instructed 100 each 12  . hydrocortisone cream 0.5 % Apply 1 application topically as needed for itching.    . Insulin Pen Needle (RELION PEN NEEDLES) 29G X 12MM MISC For BID doses 100 each 0  . Lancets (FREESTYLE) lancets Use as instructed 100 each 12  . metFORMIN (GLUCOPHAGE) 500 MG tablet Take 1 tablet (500 mg total) by mouth 2 (two) times daily with a meal. 120 tablet 3   No current facility-administered medications on file prior to visit.   Family History  Problem Relation Age of Onset  . Kidney disease Father    Social History   Social History  . Marital Status: Single    Spouse Name: N/A  . Number of Children: N/A  . Years of Education: N/A   Occupational History  . Not on file.    Social History Main Topics  . Smoking status: Never Smoker   . Smokeless tobacco: Never Used  . Alcohol Use: No  . Drug Use: No  . Sexual Activity: Not on file   Other Topics Concern  . Not on file   Social History Narrative    Review of Systems: Constitutional: Negative for fever, chills, diaphoresis, activity change, appetite change and fatigue. HENT: Negative for ear pain, nosebleeds, congestion, facial swelling, rhinorrhea, neck pain, neck stiffness and ear discharge.  Eyes: Negative for pain, discharge, redness, itching and visual disturbance. Respiratory: Negative for cough, choking, chest tightness, shortness of breath, wheezing and stridor.  Cardiovascular: Negative for chest pain, palpitations and leg swelling. Gastrointestinal: Negative for abdominal distention. Genitourinary: Negative for dysuria, urgency, frequency, hematuria, flank pain, decreased urine volume, difficulty urinating and dyspareunia.  Musculoskeletal: Negative for back pain, joint swelling, arthralgias and gait problem. Neurological: Negative for dizziness, tremors, seizures, syncope, facial asymmetry, speech difficulty, weakness, light-headedness, numbness and headaches.  Hematological: Negative for adenopathy. Does not bruise/bleed easily. Psychiatric/Behavioral: Negative for hallucinations, behavioral problems, confusion, dysphoric mood, decreased concentration and agitation.    Objective:   Filed Vitals:   11/01/15 1501  BP: 109/71  Pulse: 90  Temp: 98 F (36.7 C)  Resp: 16    Physical Exam  Cardiovascular: Normal rate,  regular rhythm and normal heart sounds.   Pulmonary/Chest: Effort normal and breath sounds normal. Right breast exhibits no mass. Left breast exhibits no mass.  Abdominal: Soft. Bowel sounds are normal. There is no tenderness.  Genitourinary: Vagina normal and uterus normal. No breast tenderness or discharge. Cervix exhibits friability. Cervix exhibits no motion tenderness  and no discharge. Right adnexum displays no tenderness.  Lymphadenopathy:       Right: No inguinal adenopathy present.       Left: No inguinal adenopathy present.     Lab Results  Component Value Date   WBC 4.9 10/19/2015   HGB 11.8* 10/19/2015   HCT 36.7 10/19/2015   MCV 88.0 10/19/2015   PLT 320 10/19/2015   Lab Results  Component Value Date   CREATININE 0.91 10/19/2015   BUN 11 10/19/2015   NA 138 10/19/2015   K 3.8 10/19/2015   CL 101 10/19/2015   CO2 29 10/19/2015    Lab Results  Component Value Date   HGBA1C 5.8 10/19/2015   Lipid Panel     Component Value Date/Time   CHOL 125 10/19/2015 1001   TRIG 45 10/19/2015 1001   HDL 57 10/19/2015 1001   CHOLHDL 2.2 10/19/2015 1001   VLDL 9 10/19/2015 1001   LDLCALC 59 10/19/2015 1001       Assessment and plan:   Krista Campbell was seen today for gynecologic exam.  Diagnoses and all orders for this visit:  Papanicolaou smear -     Cytology - PAP [place] I have given her the number to the [facility] program for her to get set up with a mammogram. She will call today.   Hypomagnesemia -     magnesium oxide (MAG-OX) 400 MG tablet; Take 1 tablet (400 mg total) by mouth daily. Last labs showed that she had hypmagnesemia. I will replace today. I am unable to find a clear cause to this, EMR reveals that this has been a ongoing problem for patient since 2015. She is not on diuretics and potassium has been normal.   Return if symptoms worsen or fail to improve.       Lance Bosch, [facility] [phone] 11/01/2015, 3:41 PM

## 2015-11-01 NOTE — Progress Notes (Signed)
Depression screen [facility] 2/9 11/01/2015 06/08/2014 06/01/2014  Decreased Interest 0 1 0  Down, Depressed, Hopeless 0 - 0  PHQ - 2 Score 0 1 0  *PHQ9: 1 (feeling tired or having little energy)  GAD 7 : Generalized Anxiety Score 11/01/2015  Nervous, Anxious, on Edge 0  Control/stop worrying 1  Worry too much - different things 0  Trouble relaxing 0  Restless 1  Easily annoyed or irritable 0  Afraid - awful might happen 0  Total GAD 7 Score 2

## 2015-11-02 LAB — CERVICOVAGINAL ANCILLARY ONLY
CHLAMYDIA, DNA PROBE: NEGATIVE
NEISSERIA GONORRHEA: NEGATIVE

## 2015-11-03 LAB — CERVICOVAGINAL ANCILLARY ONLY: WET PREP (BD AFFIRM): POSITIVE — AB

## 2015-11-04 LAB — CYTOLOGY - PAP

## 2015-11-08 ENCOUNTER — Telehealth: Payer: Self-pay

## 2015-11-08 MED ORDER — FLUCONAZOLE 150 MG PO TABS: 150.0000 mg | ORAL_TABLET | Freq: Once | ORAL | Status: DC

## 2015-11-08 MED ORDER — METRONIDAZOLE 500 MG PO TABS: 500.0000 mg | ORAL_TABLET | Freq: Once | ORAL | Status: DC

## 2015-11-08 MED FILL — metroNIDAZOLE 500 MG TABS: 500 | 1 days supply | Qty: 4 | Fill #0

## 2015-11-08 MED FILL — FLUCONAZOLE 150 MG TABLET: 150 | 30 days supply | Qty: 1 | Fill #0

## 2015-11-08 NOTE — Telephone Encounter (Signed)
Spoke with patient and she is aware of her positive results for an STD And to pick up her medications at the pharmacy here

## 2015-11-08 NOTE — Telephone Encounter (Signed)
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 She will need to notify all partners so they may get treated. It is very essential for patient to tell partners specifically what they have been exposed to because this is a disease that is not commonly tested in men. Most do not know they have a disease unless the women alerts them because testing is unreliable in men. Please send Flagyl 2g to take once. No alcohol and no sex for at least one week after both partners are treated. Please fill out health department sheet She is also positive for yeast, Please send Diflucan 150 mg to take once.  Pap had abnormal cell growth. Nothing serious now but I would like to repeat her pap in 1 year to monitor her closely.

## 2015-11-22 MED FILL — ?METFORMIN HCL 500MG TABLET: 500 | 30 days supply | Qty: 60 | Fill #1

## 2015-11-22 MED FILL — ?ATORVASTATIN 20 MG TABLET: 20 | 30 days supply | Qty: 30 | Fill #1

## 2015-11-24 ENCOUNTER — Other Ambulatory Visit: Payer: Self-pay | Admitting: Internal Medicine

## 2016-01-11 MED FILL — ?ATORVASTATIN 20 MG TABLET: 20 | 30 days supply | Qty: 30 | Fill #2

## 2016-01-24 ENCOUNTER — Telehealth: Payer: Self-pay | Admitting: Internal Medicine

## 2016-01-24 NOTE — Telephone Encounter (Signed)
Pt. Called requesting to speak with a nurse about her metformin. Please f/u with pt.

## 2016-01-30 MED FILL — ?METFORMIN HCL 500MG TABLET: 500 | 15 days supply | Qty: 30 | Fill #2

## 2016-01-30 MED FILL — ?ATORVASTATIN 20 MG TABLET: 20 | 30 days supply | Qty: 30 | Fill #3

## 2016-02-02 NOTE — Telephone Encounter (Signed)
Medical Assistant left message on patient's home and cell voicemail. Voicemail states to give a call back to Gaither Biehn with [facility] at [phone].  

## 2016-03-21 ENCOUNTER — Telehealth: Payer: Self-pay | Admitting: Internal Medicine

## 2016-03-21 NOTE — Telephone Encounter (Signed)
Pt called states she was out of town for 6 wks and just wanted [place] to know that she's back and has received mail and calls but has no further concerns right now

## 2016-04-06 MED FILL — ?ATORVASTATIN 20 MG TABLET: 20 | 30 days supply | Qty: 30 | Fill #4

## 2016-04-24 MED FILL — ?METFORMIN HCL 500MG TABLET: 500 | 15 days supply | Qty: 30 | Fill #3

## 2016-05-14 MED FILL — ATORVASTATIN 20 MG TABLET: 20 | 30 days supply | Qty: 30 | Fill #5

## 2016-06-06 ENCOUNTER — Telehealth: Payer: Self-pay | Admitting: Internal Medicine

## 2016-06-06 NOTE — Telephone Encounter (Signed)
Patient is stating her numbers are running high for her diabetes...  Please follow up with patient...  Patient was offered an appt for walk in next Thursday 11/2 but is not able to come in.  Patient asked for nurse to call back

## 2016-06-08 NOTE — Telephone Encounter (Signed)
Please schedule an appointment with Krista Campbell and ask patient to bring log with them to appointment.

## 2016-06-28 ENCOUNTER — Other Ambulatory Visit: Payer: Self-pay | Admitting: Internal Medicine

## 2016-06-28 ENCOUNTER — Telehealth: Payer: Self-pay | Admitting: Family Medicine

## 2016-06-28 ENCOUNTER — Ambulatory Visit: Payer: Self-pay | Attending: Family Medicine | Admitting: Family Medicine

## 2016-06-28 ENCOUNTER — Encounter: Payer: Self-pay | Admitting: Family Medicine

## 2016-06-28 VITALS — BP 111/75 | HR 82 | Temp 98.6°F | Resp 16 | Ht 61.0 in | Wt 184.0 lb

## 2016-06-28 DIAGNOSIS — Z9119 Patient's noncompliance with other medical treatment and regimen: Secondary | ICD-10-CM | POA: Insufficient documentation

## 2016-06-28 DIAGNOSIS — Z23 Encounter for immunization: Secondary | ICD-10-CM

## 2016-06-28 DIAGNOSIS — E785 Hyperlipidemia, unspecified: Secondary | ICD-10-CM

## 2016-06-28 DIAGNOSIS — E119 Type 2 diabetes mellitus without complications: Secondary | ICD-10-CM | POA: Insufficient documentation

## 2016-06-28 DIAGNOSIS — Z794 Long term (current) use of insulin: Secondary | ICD-10-CM | POA: Insufficient documentation

## 2016-06-28 DIAGNOSIS — Z789 Other specified health status: Secondary | ICD-10-CM

## 2016-06-28 LAB — GLUCOSE, POCT (MANUAL RESULT ENTRY): POC GLUCOSE: 147 mg/dL — AB (ref 70–99)

## 2016-06-28 LAB — COMPLETE METABOLIC PANEL WITH GFR
ALBUMIN: 4 g/dL (ref 3.6–5.1)
ALK PHOS: 68 U/L (ref 33–115)
ALT: 18 U/L (ref 6–29)
AST: 22 U/L (ref 10–30)
BILIRUBIN TOTAL: 0.7 mg/dL (ref 0.2–1.2)
BUN: 11 mg/dL (ref 7–25)
CALCIUM: 9.5 mg/dL (ref 8.6–10.2)
CHLORIDE: 102 mmol/L (ref 98–110)
CO2: 29 mmol/L (ref 20–31)
CREATININE: 0.79 mg/dL (ref 0.50–1.10)
GFR, Est Non African American: >89 mL/min (ref >=60)
Glucose, Bld: 165 mg/dL — ABNORMAL HIGH (ref 65–99)
Potassium: 3.8 mmol/L (ref 3.5–5.3)
Sodium: 139 mmol/L (ref 135–146)
Total Protein: 6.9 g/dL (ref 6.1–8.1)

## 2016-06-28 LAB — POCT GLYCOSYLATED HEMOGLOBIN (HGB A1C): Hemoglobin A1C: 10.5

## 2016-06-28 MED ORDER — INSULIN GLARGINE 100 UNIT/ML SOLOSTAR PEN: 10.0000 [IU] | PEN_INJECTOR | Freq: Every day | SUBCUTANEOUS | 3 refills | Status: DC

## 2016-06-28 MED ORDER — ATORVASTATIN CALCIUM 20 MG PO TABS: 20.0000 mg | ORAL_TABLET | Freq: Every day | ORAL | 1 refills | Status: DC

## 2016-06-28 MED FILL — LANTUS SOLOSTAR 100 UNITS/M: 100 | 30 days supply | Qty: 3 | Fill #0

## 2016-06-28 NOTE — Patient Instructions (Addendum)
Diabetes Mellitus and Food It is important for you to manage your blood sugar (glucose) level. Your blood glucose level can be greatly affected by what you eat. Eating healthier foods in the appropriate amounts throughout the day at about the same time each day will help you control your blood glucose level. It can also help slow or prevent worsening of your diabetes mellitus. Healthy eating may even help you improve the level of your blood pressure and reach or maintain a healthy weight. General recommendations for healthful eating and cooking habits include:  Eating meals and snacks regularly. Avoid going long periods of time without eating to lose weight.  Eating a diet that consists mainly of plant-based foods, such as fruits, vegetables, nuts, legumes, and whole grains.  Using low-heat cooking methods, such as baking, instead of high-heat cooking methods, such as deep frying.  Work with your dietitian to make sure you understand how to use the Nutrition Facts information on food labels. How can food affect me? Carbohydrates Carbohydrates affect your blood glucose level more than any other type of food. Your dietitian will help you determine how many carbohydrates to eat at each meal and teach you how to count carbohydrates. Counting carbohydrates is important to keep your blood glucose at a healthy level, especially if you are using insulin or taking certain medicines for diabetes mellitus. Alcohol Alcohol can cause sudden decreases in blood glucose (hypoglycemia), especially if you use insulin or take certain medicines for diabetes mellitus. Hypoglycemia can be a life-threatening condition. Symptoms of hypoglycemia (sleepiness, dizziness, and disorientation) are similar to symptoms of having too much alcohol. If your health care provider has given you approval to drink alcohol, do so in moderation and use the following guidelines:  Women should not have more than one drink per day, and men  should not have more than two drinks per day. One drink is equal to: ? 12 oz of beer. ? 5 oz of wine. ? 1 oz of hard liquor.  Do not drink on an empty stomach.  Keep yourself hydrated. Have water, diet soda, or unsweetened iced tea.  Regular soda, juice, and other mixers might contain a lot of carbohydrates and should be counted.  What foods are not recommended? As you make food choices, it is important to remember that all foods are not the same. Some foods have fewer nutrients per serving than other foods, even though they might have the same number of calories or carbohydrates. It is difficult to get your body what it needs when you eat foods with fewer nutrients. Examples of foods that you should avoid that are high in calories and carbohydrates but low in nutrients include:  Trans fats (most processed foods list trans fats on the Nutrition Facts label).  Regular soda.  Juice.  Candy.  Sweets, such as cake, pie, doughnuts, and cookies.  Fried foods.  What foods can I eat? Eat nutrient-rich foods, which will nourish your body and keep you healthy. The food you should eat also will depend on several factors, including:  The calories you need.  The medicines you take.  Your weight.  Your blood glucose level.  Your blood pressure level.  Your cholesterol level.  You should eat a variety of foods, including:  Protein. ? Lean cuts of meat. ? Proteins low in saturated fats, such as fish, egg whites, and beans. Avoid processed meats.  Fruits and vegetables. ? Fruits and vegetables that may help control blood glucose levels, such as apples,   yams.  Dairy products.  Choose fat-free or low-fat dairy products, such as milk, yogurt, and cheese.  Grains, bread, pasta, and rice.  Choose whole grain products, such as multigrain bread, whole oats, and brown rice. These foods may help control blood pressure.  Fats.  Foods containing healthful fats, such as nuts,  avocado, olive oil, canola oil, and fish. Does everyone with diabetes mellitus have the same meal plan? Because every person with diabetes mellitus is different, there is not one meal plan that works for everyone. It is very important that you meet with a dietitian who will help you create a meal plan that is just right for you. This information is not intended to replace advice given to you by your health care provider. Make sure you discuss any questions you have with your health care provider. Document Released: 04/26/2005 Document Revised: 01/05/2016 Document Reviewed: 06/26/2013 Elsevier Interactive Patient Education  2017 [employer]

## 2016-06-28 NOTE — Progress Notes (Signed)
Subjective:  Patient ID: Krista Campbell, female    DOB: 05-23-1971  Age: 45 y.o. MRN: [id number]  CC: Follow-up on diabetes mellitus  HPI Krista Campbell is a 45 year old female with a history of type 2 diabetes mellitus (A1c 10.5 which is up from 5.8, eight months ago) who comes into the clinic to establish care with me. She was previously followed by the nurse practitioner who is no longer with the practice.  She had been compliant with her metformin until 2 months ago when she developed abdominal bloating, diarrhea with metformin (of note she states she has changed her diet and had been taking the metformin on an empty stomach occassionally). She has been taking NovoLog 70/30, 10 units twice daily instead and has not been checking her sugars either. She informs me she does not like taking oral pills and would rather do insulin instead.  She has no other concerns today.  Past Medical History:  Diagnosis Date  . Diabetes mellitus without complication ([place])   . Hyperlipidemia     No past surgical history on file.  No Known Allergies   Outpatient Medications Prior to Visit  Medication Sig Dispense Refill  . atorvastatin (LIPITOR) 20 MG tablet Take 1 tablet (20 mg total) by mouth daily at 6 PM. 90 tablet 1  . metFORMIN (GLUCOPHAGE) 500 MG tablet Take 1 tablet (500 mg total) by mouth 2 (two) times daily with a meal. 120 tablet 3  . BD INSULIN SYRINGE ULTRAFINE 31G X 15/64" 0.5 ML MISC USE AS DIRECTED 100 each 12  . Blood Glucose Monitoring Suppl (TRUE METRIX AIR GLUCOSE METER) DEVI 1 Device by Does not apply route 4 (four) times daily - after meals and at bedtime. 1 Device 0  . fluconazole (DIFLUCAN) 150 MG tablet Take 1 tablet (150 mg total) by mouth once. 1 tablet 0  . glucose blood (TRUE METRIX BLOOD GLUCOSE TEST) test strip Use as instructed 100 each 12  . hydrocortisone cream 0.5 % Apply 1 application topically as needed for itching.    . Insulin Pen Needle (RELION PEN  NEEDLES) 29G X 12MM MISC For BID doses (Patient not taking: Reported on 06/28/2016) 100 each 0  . Lancets (FREESTYLE) lancets Use as instructed 100 each 12  . magnesium oxide (MAG-OX) 400 MG tablet Take 1 tablet (400 mg total) by mouth daily. (Patient not taking: Reported on 06/28/2016) 5 tablet 0  . metroNIDAZOLE (FLAGYL) 500 MG tablet Take 1 tablet (500 mg total) by mouth once. (Patient not taking: Reported on 06/28/2016) 4 tablet 0   No facility-administered medications prior to visit.     ROS Review of Systems  Constitutional: Negative for activity change, appetite change and fatigue.  HENT: Negative for congestion, sinus pressure and sore throat.   Eyes: Negative for visual disturbance.  Respiratory: Negative for cough, chest tightness, shortness of breath and wheezing.   Cardiovascular: Negative for chest pain and palpitations.  Gastrointestinal: Negative for abdominal distention, abdominal pain and constipation.  Endocrine: Negative for polydipsia.  Genitourinary: Negative for dysuria and frequency.  Musculoskeletal: Negative for arthralgias and back pain.  Skin: Negative for rash.  Neurological: Negative for tremors, light-headedness and numbness.  Hematological: Does not bruise/bleed easily.  Psychiatric/Behavioral: Negative for agitation and behavioral problems.    Objective:  BP 111/75   Pulse 82   Temp 98.6 F (37 C) (Oral)   Resp 16   Ht 5\' 1"  (1.549 m)   Wt 184 lb (83.5 kg)  LMP 05/28/2016   BMI 34.77 kg/m   BP/Weight 06/28/2016 Q000111Q [id number]  Systolic BP [id number]  Diastolic BP 75 71 71  Wt. (Lbs) 184 192 191  BMI 34.77 36.3 36.11      Physical Exam  Constitutional: She is oriented to person, place, and time. She appears well-developed and well-nourished.  Cardiovascular: Normal rate, normal heart sounds and intact distal pulses.   No murmur heard. Pulmonary/Chest: Effort normal and breath sounds normal. She has no wheezes. She has no rales.  She exhibits no tenderness.  Abdominal: Soft. Bowel sounds are normal. She exhibits no distension and no mass. There is no tenderness.  Musculoskeletal: Normal range of motion.  Neurological: She is alert and oriented to person, place, and time.     Lab Results  Component Value Date   HGBA1C 10.5 06/28/2016    CMP Latest Ref Rng & Units 10/19/2015 06/15/2014 05/25/2014  Glucose 65 - 99 mg/dL 107(H) 101(H) 119(H)  BUN 7 - 25 mg/dL 11 7 6   Creatinine 0.50 - 1.10 mg/dL 0.91 0.72 0.80  Sodium 135 - 146 mmol/L 138 141 142  Potassium 3.5 - 5.3 mmol/L 3.8 4.1 3.8  Chloride 98 - 110 mmol/L 101 104 105  CO2 20 - 31 mmol/L 29 27 27   Calcium 8.6 - 10.2 mg/dL 9.4 9.5 8.4  Total Protein 6.1 - 8.1 g/dL 7.2 6.8 -  Total Bilirubin 0.2 - 1.2 mg/dL 0.6 0.5 -  Alkaline Phos 33 - 115 U/L 85 73 -  AST 10 - 30 U/L 24 21 -  ALT 6 - 29 U/L 23 15 -    Lipid Panel     Component Value Date/Time   CHOL 125 10/19/2015 1001   TRIG 45 10/19/2015 1001   HDL 57 10/19/2015 1001   CHOLHDL 2.2 10/19/2015 1001   VLDL 9 10/19/2015 1001   LDLCALC 59 10/19/2015 1001    Assessment & Plan:   1. Type 2 diabetes mellitus without complication, unspecified long term insulin use status (HCC) Uncontrolled with A1c of 10.5 (which has trended up from 5.8, eight months ago) due to noncompliance Patient does not like taking pills We'll commenced on Lantus and she has been advised to self adjust dose by 2 units in the event that blood sugars are not at goal. Keep blood sugar logs with fasting goals of 80-120 mg/dl, random of less than 180 and in the event of sugars less than 60 mg/dl or greater than 400 mg/dl please notify the clinic ASAP. It is recommended that you undergo annual eye exams and annual foot exams. Pneumovax is recommended every 5 years before the age of 8 and once for a lifetime at or after the age of 44. - POCT glucose (manual entry) - HgB A1c - Insulin Glargine (LANTUS SOLOSTAR) 100 UNIT/ML Solostar  Pen; Inject 10 Units into the skin daily at 10 pm.  Dispense: 3 pen; Refill: 3 - COMPLETE METABOLIC PANEL WITH GFR - Microalbumin / creatinine urine ratio  2. Medication intolerance Discontinue metformin since she is unable to tolerate this.   Meds ordered this encounter  Medications  . Insulin Glargine (LANTUS SOLOSTAR) 100 UNIT/ML Solostar Pen    Sig: Inject 10 Units into the skin daily at 10 pm.    Dispense:  3 pen    Refill:  3    Follow-up: Return in about 2 weeks (around 07/12/2016) for Follow-up of diabetes mellitus.   Krista Morale MD

## 2016-06-28 NOTE — Progress Notes (Signed)
Stopped Metformin 2 weeks ago.Started taking 10 units twice a day.

## 2016-06-28 NOTE — Telephone Encounter (Signed)
Atorvastatin refilled.  

## 2016-06-28 NOTE — Telephone Encounter (Signed)
Patient needs a refill for lipitor. Please follow up.

## 2016-06-29 LAB — MICROALBUMIN / CREATININE URINE RATIO
CREATININE, URINE: 83 mg/dL (ref 20–320)
MICROALB UR: 0.5 mg/dL
Microalb Creat Ratio: 6 mcg/mg creat (ref <30)

## 2016-07-12 ENCOUNTER — Other Ambulatory Visit: Payer: Self-pay | Admitting: Internal Medicine

## 2016-07-12 DIAGNOSIS — E119 Type 2 diabetes mellitus without complications: Secondary | ICD-10-CM

## 2016-07-12 MED FILL — ATORVASTATIN 20 MG TABLET: 20 | 30 days supply | Qty: 30 | Fill #0

## 2016-07-12 MED FILL — TRUE METRIX TEST STRIP: 30 days supply | Qty: 100 | Fill #0

## 2016-07-25 MED FILL — LANTUS SOLOSTAR 100 UNITS/M: 100 | 30 days supply | Qty: 3 | Fill #1

## 2016-07-26 ENCOUNTER — Other Ambulatory Visit: Payer: Self-pay

## 2016-07-26 DIAGNOSIS — E119 Type 2 diabetes mellitus without complications: Secondary | ICD-10-CM

## 2016-07-26 MED ORDER — INSULIN GLARGINE 100 UNIT/ML SOLOSTAR PEN: 10.0000 [IU] | PEN_INJECTOR | Freq: Every day | SUBCUTANEOUS | 3 refills | Status: DC

## 2016-07-30 ENCOUNTER — Other Ambulatory Visit: Payer: Self-pay | Admitting: Internal Medicine

## 2016-08-27 MED FILL — LANTUS SOLOSTAR 100 UNITS/M: 100 | 30 days supply | Qty: 3 | Fill #2

## 2016-08-27 MED FILL — ATORVASTATIN 20 MG TABLET: 20 | 30 days supply | Qty: 30 | Fill #1

## 2016-09-05 ENCOUNTER — Ambulatory Visit (HOSPITAL_BASED_OUTPATIENT_CLINIC_OR_DEPARTMENT_OTHER): Payer: Self-pay | Admitting: Family Medicine

## 2016-09-05 ENCOUNTER — Encounter: Payer: Self-pay | Admitting: Family Medicine

## 2016-09-05 ENCOUNTER — Ambulatory Visit: Payer: Self-pay | Attending: Family Medicine

## 2016-09-05 VITALS — BP 108/66 | HR 83 | Temp 98.4°F | Ht 61.0 in | Wt 196.0 lb

## 2016-09-05 DIAGNOSIS — E119 Type 2 diabetes mellitus without complications: Secondary | ICD-10-CM | POA: Insufficient documentation

## 2016-09-05 DIAGNOSIS — E785 Hyperlipidemia, unspecified: Secondary | ICD-10-CM | POA: Insufficient documentation

## 2016-09-05 DIAGNOSIS — Z794 Long term (current) use of insulin: Secondary | ICD-10-CM | POA: Insufficient documentation

## 2016-09-05 LAB — GLUCOSE, POCT (MANUAL RESULT ENTRY): POC Glucose: 173 mg/dl — AB (ref 70–99)

## 2016-09-05 MED ORDER — INSULIN GLARGINE 100 UNIT/ML SOLOSTAR PEN: 15.0000 [IU] | PEN_INJECTOR | Freq: Every day | SUBCUTANEOUS | 3 refills | Status: DC

## 2016-09-05 NOTE — Progress Notes (Signed)
Subjective:  Patient ID: Krista Campbell, female    DOB: Apr 21, 1971  Age: 46 y.o. MRN: [id number]  CC: Diabetes and Hyperlipidemia   HPI Krista Campbell is a 46 year old female with type 2 diabetes mellitus (A1c 10.5) who presents today for follow-up on diabetes mellitus. Metformin had been discontinued due to complaints of GI symptoms and she was commenced on Lantus 10 units and was supposed to self adjust by 2 units every third day however she got confused regarding the instructions and went up to 15 units but then resumes 10 units again. She endorses not having to a diabetic diet. Glucometer reviewed-blood sugars in the 300s to 400s. She has occasional numbness in the feet which is not bothersome.  Past Medical History:  Diagnosis Date  . Diabetes mellitus without complication ([place])   . Hyperlipidemia     History reviewed. No pertinent surgical history.    Outpatient Medications Prior to Visit  Medication Sig Dispense Refill  . atorvastatin (LIPITOR) 20 MG tablet Take 1 tablet (20 mg total) by mouth daily at 6 PM. 90 tablet 1  . BD INSULIN SYRINGE ULTRAFINE 31G X 15/64" 0.5 ML MISC USE AS DIRECTED 100 each 12  . Blood Glucose Monitoring Suppl (TRUE METRIX AIR GLUCOSE METER) DEVI 1 Device by Does not apply route 4 (four) times daily - after meals and at bedtime. 1 Device 0  . glucose blood test strip Use as instructed 100 each 12  . Insulin Pen Needle (RELION PEN NEEDLES) 29G X 12MM MISC For BID doses 100 each 0  . Lancets (FREESTYLE) lancets Use as instructed 100 each 12  . Insulin Glargine (LANTUS SOLOSTAR) 100 UNIT/ML Solostar Pen Inject 10 Units into the skin daily at 10 pm. 15 mL 3  . fluconazole (DIFLUCAN) 150 MG tablet Take 1 tablet (150 mg total) by mouth once. 1 tablet 0  . hydrocortisone cream 0.5 % Apply 1 application topically as needed for itching.     No facility-administered medications prior to visit.     ROS Review of Systems  Constitutional: Negative  for activity change and appetite change.  HENT: Negative for sinus pressure and sore throat.   Respiratory: Negative for chest tightness, shortness of breath and wheezing.   Cardiovascular: Negative for chest pain and palpitations.  Gastrointestinal: Negative for abdominal distention, abdominal pain and constipation.  Genitourinary: Negative.   Musculoskeletal: Negative.   Neurological: Positive for numbness.  Psychiatric/Behavioral: Negative for behavioral problems and dysphoric mood.    Objective:  BP 108/66 (BP Location: Right Arm, Patient Position: Sitting, Cuff Size: Large)   Pulse 83   Temp 98.4 F (36.9 C) (Oral)   Ht 5\' 1"  (1.549 m)   Wt 196 lb (88.9 kg)   SpO2 100%   BMI 37.03 kg/m   BP/Weight 09/05/2016 06/28/2016 Q000111Q  Systolic BP 123XX123 [id number] 0000000  Diastolic BP 66 75 71  Wt. (Lbs) 196 184 192  BMI 37.03 34.77 36.3      Physical Exam  Constitutional: She is oriented to person, place, and time. She appears well-developed and well-nourished.  Cardiovascular: Normal rate, normal heart sounds and intact distal pulses.   No murmur heard. Pulmonary/Chest: Effort normal and breath sounds normal. She has no wheezes. She has no rales. She exhibits no tenderness.  Abdominal: Soft. Bowel sounds are normal. She exhibits no distension and no mass. There is no tenderness.  Musculoskeletal: Normal range of motion.  Neurological: She is alert and oriented to person, place, and time.  Lab Results  Component Value Date   HGBA1C 10.5 06/28/2016    Assessment & Plan:   1. Type 2 diabetes mellitus without complication, with long-term current use of insulin (HCC) Uncontrolled with A1c of 10.5 which is up from 5.9 previously Patient to start at 15 units of Lantus at bedtime and self adjust in increments of 2 units every third day until goal blood sugar is obtained. If random sugars are elevated I will consider adding NovoLog regimen. She states taking oral pills be a problem  hence Glipizide not a consideration Adhere to Diabetic diet Keep blood sugar logs with fasting goals of 80-120 mg/dl, random of less than 180 and in the event of sugars less than 60 mg/dl or greater than 400 mg/dl please notify the clinic ASAP. It is recommended that you undergo annual eye exams and annual foot exams. Pneumovax is recommended every 5 years before the age of 15 and once for a lifetime at or after the age of 16. - Glucose (CBG) - Insulin Glargine (LANTUS SOLOSTAR) 100 UNIT/ML Solostar Pen; Inject 15 Units into the skin daily at 10 pm.  Dispense: 15 mL; Refill: 3   Meds ordered this encounter  Medications  . Insulin Glargine (LANTUS SOLOSTAR) 100 UNIT/ML Solostar Pen    Sig: Inject 15 Units into the skin daily at 10 pm.    Dispense:  15 mL    Refill:  3    Follow-up: Return in about 1 month (around 10/06/2016) for Follow-up of diabetes mellitus.   Arnoldo Morale MD

## 2016-09-20 MED FILL — $LANTUS SOLOSTAR 100 UNITS/: 100 | 30 days supply | Qty: 3 | Fill #3

## 2016-10-04 ENCOUNTER — Telehealth: Payer: Self-pay | Admitting: Family Medicine

## 2016-10-04 MED ORDER — "INSULIN SYRINGE-NEEDLE U-100 31G X 15/64"" 0.5 ML MISC": 12 refills | Status: AC

## 2016-10-04 MED FILL — ATORVASTATIN 20 MG TABLET: 20 | 30 days supply | Qty: 30 | Fill #2

## 2016-10-04 NOTE — Telephone Encounter (Signed)
Pt requesting refill of pen needles

## 2016-10-04 NOTE — Telephone Encounter (Signed)
Pen needles refilled 

## 2016-10-05 MED FILL — TRUEPLUS PEN NDL 31GX5/16: 31 GX5/16" | 25 days supply | Qty: 100 | Fill #0

## 2016-10-09 MED FILL — TRUE METRIX TEST STRIP: 30 days supply | Qty: 100 | Fill #1

## 2016-10-16 MED FILL — $LANTUS SOLOSTAR 100 UNITS/: 100 | 28 days supply | Qty: 3 | Fill #0

## 2016-10-30 MED FILL — ATORVASTATIN 20 MG TABLET: 20 | 30 days supply | Qty: 30 | Fill #3

## 2016-11-05 ENCOUNTER — Other Ambulatory Visit: Payer: Self-pay | Admitting: Family Medicine

## 2016-11-05 DIAGNOSIS — E119 Type 2 diabetes mellitus without complications: Secondary | ICD-10-CM

## 2016-11-05 DIAGNOSIS — Z794 Long term (current) use of insulin: Principal | ICD-10-CM

## 2016-11-05 MED ORDER — INSULIN GLARGINE 100 UNIT/ML SOLOSTAR PEN: 20.0000 [IU] | PEN_INJECTOR | Freq: Every day | SUBCUTANEOUS | 3 refills | Status: DC

## 2016-11-05 NOTE — Telephone Encounter (Signed)
Pt states that pharmacy needs Dr Jarold Song to rewrite prescription because the dosage is not correct. Prescription states to take 10 per day and pt is taking 20 per day so medication is not lasting until next refill. Pt states that she is comfortable with dosage of 20 and that dosage seems to maintain healthy blood sugar levels. Please f/u with pt. Thank you.

## 2016-11-05 NOTE — Telephone Encounter (Signed)
New orders sent to pharmacy

## 2016-11-06 MED FILL — $LANTUS SOLOSTAR 100 UNITS/: 100 | 30 days supply | Qty: 6 | Fill #0

## 2016-11-07 NOTE — Telephone Encounter (Signed)
Writer LVM informing patient that MD sent a new prescription for her.

## 2016-12-12 MED FILL — $LANTUS SOLOSTAR 100 UNITS/: 100 | 14 days supply | Qty: 3 | Fill #1

## 2016-12-13 MED FILL — TRUE METRIX TEST STRIP: 30 days supply | Qty: 100 | Fill #2

## 2016-12-25 ENCOUNTER — Telehealth: Payer: Self-pay | Admitting: Family Medicine

## 2016-12-25 MED FILL — ATORVASTATIN 20 MG TABLET: 20 | 30 days supply | Qty: 30 | Fill #4

## 2016-12-25 NOTE — Telephone Encounter (Signed)
Patient called the office asking to speak with PCP regarding her A1C checked. Possibly having Krista Levine do it since provider has no availability this month. Pt stated that she will be out of state during the summer and needs to get this done. Pt also stated that she will need medication refills for when she will be out. Please follow up. Pt is leaving in June.  Thank you.

## 2016-12-25 NOTE — Telephone Encounter (Signed)
Patient needs appointment with provider if it has been over 3 months since last visit and is requesting a FU and additional refills.

## 2016-12-25 NOTE — Telephone Encounter (Signed)
Called and left patient a message asking her to call us to schedule an appt with Dr. Jarold Song

## 2017-01-03 MED FILL — !LANTUS SOLOSTAR 100UNITS/M: 100 | 27 days supply | Qty: 6 | Fill #2

## 2017-01-16 ENCOUNTER — Ambulatory Visit: Payer: Self-pay | Admitting: Family Medicine

## 2017-01-17 ENCOUNTER — Other Ambulatory Visit: Payer: Self-pay | Admitting: Family Medicine

## 2017-01-17 ENCOUNTER — Other Ambulatory Visit: Payer: Self-pay | Admitting: Pharmacist

## 2017-01-17 DIAGNOSIS — E785 Hyperlipidemia, unspecified: Secondary | ICD-10-CM

## 2017-01-17 MED ORDER — INSULIN PEN NEEDLE 32G X 4 MM MISC: 12 refills | Status: DC

## 2017-01-17 MED FILL — !LANTUS SOLOSTAR 100UNITS/M: 100 | 30 days supply | Qty: 6 | Fill #3

## 2017-01-17 MED FILL — TRUEPLUS PEN NDL 32GX5/32": 32GX 5/32" | 30 days supply | Qty: 100 | Fill #0

## 2017-01-17 MED FILL — ATORVASTATIN 20 MG TABLET: 20 | 30 days supply | Qty: 30 | Fill #5

## 2017-01-17 MED FILL — TRUEPLUS PEN NDL 32GX5/32: 32GX 5/32" | 30 days supply | Qty: 100 | Fill #0

## 2017-01-18 MED FILL — TRUE METRIX TEST STRIP: 15 days supply | Qty: 50 | Fill #3

## 2017-01-22 ENCOUNTER — Ambulatory Visit: Payer: Self-pay | Attending: Family Medicine | Admitting: Physician Assistant

## 2017-01-22 ENCOUNTER — Encounter: Payer: Self-pay | Admitting: Physician Assistant

## 2017-01-22 VITALS — BP 111/76 | HR 81 | Temp 98.5°F | Resp 16 | Ht 61.0 in | Wt 178.2 lb

## 2017-01-22 DIAGNOSIS — Z794 Long term (current) use of insulin: Secondary | ICD-10-CM | POA: Insufficient documentation

## 2017-01-22 DIAGNOSIS — J069 Acute upper respiratory infection, unspecified: Secondary | ICD-10-CM | POA: Insufficient documentation

## 2017-01-22 DIAGNOSIS — E119 Type 2 diabetes mellitus without complications: Secondary | ICD-10-CM | POA: Insufficient documentation

## 2017-01-22 DIAGNOSIS — E785 Hyperlipidemia, unspecified: Secondary | ICD-10-CM | POA: Insufficient documentation

## 2017-01-22 DIAGNOSIS — J399 Disease of upper respiratory tract, unspecified: Secondary | ICD-10-CM

## 2017-01-22 DIAGNOSIS — Z79899 Other long term (current) drug therapy: Secondary | ICD-10-CM | POA: Insufficient documentation

## 2017-01-22 LAB — POCT GLYCOSYLATED HEMOGLOBIN (HGB A1C): HEMOGLOBIN A1C: 11.9

## 2017-01-22 LAB — GLUCOSE, POCT (MANUAL RESULT ENTRY): POC GLUCOSE: 235 mg/dL — AB (ref 70–99)

## 2017-01-22 MED ORDER — INSULIN GLARGINE 100 UNIT/ML SOLOSTAR PEN: 30.0000 [IU] | PEN_INJECTOR | Freq: Every day | SUBCUTANEOUS | 3 refills | Status: DC

## 2017-01-22 MED ORDER — BENZONATATE 200 MG PO CAPS: 200.0000 mg | ORAL_CAPSULE | Freq: Two times a day (BID) | ORAL | 0 refills | Status: DC | PRN

## 2017-01-22 MED ORDER — AZITHROMYCIN 250 MG PO TABS: ORAL_TABLET | ORAL | 0 refills | Status: DC

## 2017-01-22 MED FILL — AZITHROMYCIN 250 MG TABLET: 250 | 5 days supply | Qty: 6 | Fill #0

## 2017-01-22 MED FILL — LANTUS SOLOSTAR 100 UNITS/M: 100 | 30 days supply | Qty: 9 | Fill #0

## 2017-01-22 NOTE — Progress Notes (Signed)
Patient ID: Krista Campbell, female   DOB: 1970/12/02, 46 y.o.   MRN: [id number]   Krista Campbell, is a 46 y.o. female  [id number]  [id number]  DOB - [birth year]  Subjective:  Chief Complaint and HPI: Krista Campbell is a 46 y.o. female here today for ~10 day h/o cough and congestion.  Mucus is thick and yellowish.  No f/c. She is about to leave to work in [place] for 3 weeks with kids.  +nasal congestion. No sinus pain.  +PND.    Diabetes-says she is compliant with 20 untis of Lantus at night for the last 4 months.  Admits to poor diet.  Also admits to "playing around" with insulin dose prior to a few months ago.  Previously taken off metformin due to GI intolerance.  Not checking blood sugars regularly but then says sugars are doing good and "normal in the mornings."  Denies s/sx hyper/hypoglycemia.  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 EENT:  No vision changes, No blurry vision, No hearing changes. No mouth, throat, or ear problems.  Respiratory: + cough, No SOB Cardiac: No CP, no palpitations GI:  No abd pain, No N/V/D. GU: No Urinary s/sx Musculoskeletal: No joint pain Neuro: No headache, no dizziness, no motor weakness.  Skin: No rash Endocrine:  No polydipsia. No polyuria.  Psych: Denies SI/HI  No problems updated.  ALLERGIES: No Known Allergies  PAST MEDICAL HISTORY: Past Medical History:  Diagnosis Date  . Diabetes mellitus without complication ([place])   . Hyperlipidemia     MEDICATIONS AT HOME: Prior to Admission medications   Medication Sig Start Date End Date Taking? Authorizing Provider  atorvastatin (LIPITOR) 20 MG tablet Take 1 tablet (20 mg total) by mouth daily at 6 PM. 06/28/16  Yes Amao, Enobong, MD  Blood Glucose Monitoring Suppl (TRUE METRIX AIR GLUCOSE METER) DEVI 1 Device by Does not apply route 4 (four) times  daily - after meals and at bedtime. 04/11/15  Yes Chari Manning A, NP  glucose blood test strip Use as instructed 07/12/16  Yes Arnoldo Morale, MD  Insulin Glargine (LANTUS SOLOSTAR) 100 UNIT/ML Solostar Pen Inject 30 Units into the skin daily at 10 pm. 01/22/17  Yes Gurneet Matarese, Dionne Bucy, PA-C  Lancets (FREESTYLE) lancets Use as instructed 05/25/14  Yes Geradine Girt, DO  azithromycin (ZITHROMAX) 250 MG tablet Take 2 today then 1 daily 01/22/17   Argentina Donovan, PA-C  benzonatate (TESSALON) 200 MG capsule Take 1 capsule (200 mg total) by mouth 2 (two) times daily as needed for cough. 01/22/17   Argentina Donovan, PA-C  Insulin Pen Needle (RELION PEN NEEDLES) 29G X 12MM MISC For BID doses Patient not taking: Reported on 01/22/2017 09/02/14   Lance Bosch, NP  Insulin Pen Needle (TRUEPLUS PEN NEEDLES) 32G X 4 MM MISC Use as directed Patient not taking: Reported on 01/22/2017 01/17/17   Arnoldo Morale, MD  Insulin Syringe-Needle U-100 (BD INSULIN SYRINGE ULTRAFINE) 31G X 15/64" 0.5 ML MISC USE AS DIRECTED Patient not taking: Reported on 01/22/2017 10/04/16   Arnoldo Morale, MD     Objective:  EXAM:   Vitals:   01/22/17 0913  BP: 111/76  Pulse: 81  Resp: 16  Temp: 98.5 F (36.9 C)  TempSrc: Oral  SpO2: 98%  Weight: 178 lb 3.2 oz (80.8 kg)  Height: 5\' 1"  (1.549  m)    General appearance : A&OX3. NAD. Non-toxic-appearing HEENT: Atraumatic and Normocephalic.  PERRLA. EOM intact.  TM clear B. Mouth-MMM, post pharynx WNL w/ mild erythema and PND. No exudate Neck: supple, no JVD. No cervical lymphadenopathy. No thyromegaly Chest/Lungs:  Breathing-non-labored, Good air entry bilaterally, breath sounds normal without rales, rhonchi, or wheezing  CVS: S1 S2 regular, no murmurs, gallops, rubs  Extremities: Bilateral Lower Ext shows no edema, both legs are warm to touch with = pulse throughout Neurology:  CN II-XII grossly intact, Non focal.   Psych:  TP linear. J/I WNL. Normal speech. Appropriate eye  contact and affect.  Skin:  No Rash  Data Review Lab Results  Component Value Date   HGBA1C 11.9 01/22/2017   HGBA1C 10.5 06/28/2016   HGBA1C 5.8 10/19/2015     Assessment & Plan   1. Type 2 diabetes mellitus without complication, with long-term current use of insulin (HCC) UNCONTROLLED;  Unsure of compliance.  Stressed the need for better glycemic control, dietary compliance, and regular follow-up.  Patient seems somewhat defensive/in denial.  Discussed at length-spent more than 51mins face to face discussing diet, exercise, medication management. - Glucose (CBG) - HgB A1c - Insulin Glargine (LANTUS SOLOSTAR) 100 UNIT/ML Solostar Pen; Inject 30 Units into the skin daily at 10 pm.  Dispense: 15 mL; Refill: 3  2. Upper respiratory infection-will cover for atypicals due to length of illness - azithromycin (ZITHROMAX) 250 MG tablet; Take 2 today then 1 daily  Dispense: 6 tablet; Refill: 0 - benzonatate (TESSALON) 200 MG capsule; Take 1 capsule (200 mg total) by mouth 2 (two) times daily as needed for cough.  Dispense: 20 capsule; Refill: 0  Patient have been counseled extensively about nutrition and exercise  Return in about 6 weeks (around 03/05/2017) for Dr Jarold Song for DM check.  The patient was given clear instructions to go to ER or return to medical center if symptoms don't improve, worsen or new problems develop. The patient verbalized understanding. The patient was told to call to get lab results if they haven't heard anything in the next week.     Freeman Caldron, PA-C [facility], [place]   01/22/2017, 9:30 AM

## 2017-01-22 NOTE — Patient Instructions (Signed)
Check blood sugars fasting and at bedtime   Carbohydrate Counting for Diabetes Mellitus, Adult Carbohydrate counting is a method for keeping track of how many carbohydrates you eat. Eating carbohydrates naturally increases the amount of sugar (glucose) in the blood. Counting how many carbohydrates you eat helps keep your blood glucose within normal limits, which helps you manage your diabetes (diabetes mellitus). It is important to know how many carbohydrates you can safely have in each meal. This is different for every person. A diet and nutrition specialist (registered dietitian) can help you make a meal plan and calculate how many carbohydrates you should have at each meal and snack. Carbohydrates are found in the following foods:  Grains, such as breads and cereals.  Dried beans and soy products.  Starchy vegetables, such as potatoes, peas, and corn.  Fruit and fruit juices.  Milk and yogurt.  Sweets and snack foods, such as cake, cookies, candy, chips, and soft drinks.  How do I count carbohydrates? There are two ways to count carbohydrates in food. You can use either of the methods or a combination of both. Reading "Nutrition Facts" on packaged food The "Nutrition Facts" list is included on the labels of almost all packaged foods and beverages in the [place] It includes:  The serving size.  Information about nutrients in each serving, including the grams (g) of carbohydrate per serving.  To use the "Nutrition Facts":  Decide how many servings you will have.  Multiply the number of servings by the number of carbohydrates per serving.  The resulting number is the total amount of carbohydrates that you will be having.  Learning standard serving sizes of other foods When you eat foods containing carbohydrates that are not packaged or do not include "Nutrition Facts" on the label, you need to measure the servings in order to count the amount of carbohydrates:  Measure the foods  that you will eat with a food scale or measuring cup, if needed.  Decide how many standard-size servings you will eat.  Multiply the number of servings by 15. Most carbohydrate-rich foods have about 15 g of carbohydrates per serving. ? For example, if you eat 8 oz (170 g) of strawberries, you will have eaten 2 servings and 30 g of carbohydrates (2 servings x 15 g = 30 g).  For foods that have more than one food mixed, such as soups and casseroles, you must count the carbohydrates in each food that is included.  The following list contains standard serving sizes of common carbohydrate-rich foods. Each of these servings has about 15 g of carbohydrates:   hamburger bun or  English muffin.   oz (15 mL) syrup.   oz (14 g) jelly.  1 slice of bread.  1 six-inch tortilla.  3 oz (85 g) cooked rice or pasta.  4 oz (113 g) cooked dried beans.  4 oz (113 g) starchy vegetable, such as peas, corn, or potatoes.  4 oz (113 g) hot cereal.  4 oz (113 g) mashed potatoes or  of a large baked potato.  4 oz (113 g) canned or frozen fruit.  4 oz (120 mL) fruit juice.  4-6 crackers.  6 chicken nuggets.  6 oz (170 g) unsweetened dry cereal.  6 oz (170 g) plain fat-free yogurt or yogurt sweetened with artificial sweeteners.  8 oz (240 mL) milk.  8 oz (170 g) fresh fruit or one small piece of fruit.  24 oz (680 g) popped popcorn.  Example of carbohydrate counting  Sample meal  3 oz (85 g) chicken breast.  6 oz (170 g) brown rice.  4 oz (113 g) corn.  8 oz (240 mL) milk.  8 oz (170 g) strawberries with sugar-free whipped topping. Carbohydrate calculation 1. Identify the foods that contain carbohydrates: ? Rice. ? Corn. ? Milk. ? Strawberries. 2. Calculate how many servings you have of each food: ? 2 servings rice. ? 1 serving corn. ? 1 serving milk. ? 1 serving strawberries. 3. Multiply each number of servings by 15 g: ? 2 servings rice x 15 g = 30 g. ? 1 serving  corn x 15 g = 15 g. ? 1 serving milk x 15 g = 15 g. ? 1 serving strawberries x 15 g = 15 g. 4. Add together all of the amounts to find the total grams of carbohydrates eaten: ? 30 g + 15 g + 15 g + 15 g = 75 g of carbohydrates total. This information is not intended to replace advice given to you by your health care provider. Make sure you discuss any questions you have with your health care provider. Document Released: 07/30/2005 Document Revised: 02/17/2016 Document Reviewed: 01/11/2016 Elsevier Interactive Patient Education  [employer].

## 2017-01-28 ENCOUNTER — Ambulatory Visit: Payer: Self-pay | Admitting: Family Medicine

## 2017-03-14 ENCOUNTER — Other Ambulatory Visit: Payer: Self-pay | Admitting: Family Medicine

## 2017-03-14 DIAGNOSIS — E785 Hyperlipidemia, unspecified: Secondary | ICD-10-CM

## 2017-03-14 MED FILL — $LANTUS SOLOSTAR 100 UNITS/: 100 | 30 days supply | Qty: 9 | Fill #1

## 2017-03-15 MED FILL — ?ATORVASTATIN 20 MG TABLET: 20 | 30 days supply | Qty: 30 | Fill #0

## 2017-03-18 ENCOUNTER — Encounter: Payer: Self-pay | Admitting: Family Medicine

## 2017-03-18 ENCOUNTER — Ambulatory Visit: Payer: Self-pay | Attending: Family Medicine | Admitting: Family Medicine

## 2017-03-18 VITALS — BP 111/72 | HR 77 | Temp 98.1°F | Resp 18 | Ht 61.0 in | Wt 181.0 lb

## 2017-03-18 DIAGNOSIS — Z23 Encounter for immunization: Secondary | ICD-10-CM

## 2017-03-18 DIAGNOSIS — Z794 Long term (current) use of insulin: Secondary | ICD-10-CM | POA: Insufficient documentation

## 2017-03-18 DIAGNOSIS — E119 Type 2 diabetes mellitus without complications: Secondary | ICD-10-CM

## 2017-03-18 DIAGNOSIS — E669 Obesity, unspecified: Secondary | ICD-10-CM | POA: Insufficient documentation

## 2017-03-18 DIAGNOSIS — E785 Hyperlipidemia, unspecified: Secondary | ICD-10-CM

## 2017-03-18 DIAGNOSIS — Z6834 Body mass index (BMI) 34.0-34.9, adult: Secondary | ICD-10-CM | POA: Insufficient documentation

## 2017-03-18 DIAGNOSIS — M6790 Unspecified disorder of synovium and tendon, unspecified site: Secondary | ICD-10-CM

## 2017-03-18 LAB — POCT GLYCOSYLATED HEMOGLOBIN (HGB A1C): Hemoglobin A1C: 7

## 2017-03-18 LAB — GLUCOSE, POCT (MANUAL RESULT ENTRY): POC Glucose: 144 mg/dl — AB (ref 70–99)

## 2017-03-18 MED ORDER — ATORVASTATIN CALCIUM 20 MG PO TABS: ORAL_TABLET | ORAL | 1 refills | Status: DC

## 2017-03-18 MED ORDER — INSULIN GLARGINE 100 UNIT/ML SOLOSTAR PEN: 30.0000 [IU] | PEN_INJECTOR | Freq: Every day | SUBCUTANEOUS | 3 refills | Status: DC

## 2017-03-18 NOTE — Progress Notes (Signed)
Subjective:  Patient ID: Krista Campbell, female    DOB: 1970-11-20  Age: 46 y.o. MRN: [id number]  CC: Diabetes   HPI Krista Campbell 46 year old female with history of type 2 diabetes mellitus (A1c 7.0 which has improved from 11.9 previously), hyperlipidemia, obesity who presents today for follow-up visit.  She endorses compliance with her Lantus and denies hypoglycemia. She denies visual complaints, numbness in extremities. Last eye exam was last month with no report of diabetic retinopathy.  Tolerating her statin with no complaint of myalgia.  She has been physically active over the summer with involvement in [place]. She has been on her feet for several hours in the day wearing flip-flops and sandals and this resulted in intermittent pain and swelling of both dorsum and ankles at the end of the day. She denies pain at this time but last felt pain yesterday which responded to the use of NSAIDs.  Past Medical History:  Diagnosis Date  . Diabetes mellitus without complication ([place])   . Hyperlipidemia     History reviewed. No pertinent surgical history.  No Known Allergies   Outpatient Medications Prior to Visit  Medication Sig Dispense Refill  . Blood Glucose Monitoring Suppl (TRUE METRIX AIR GLUCOSE METER) DEVI 1 Device by Does not apply route 4 (four) times daily - after meals and at bedtime. 1 Device 0  . glucose blood test strip Use as instructed 100 each 12  . Insulin Pen Needle (RELION PEN NEEDLES) 29G X 12MM MISC For BID doses 100 each 0  . Insulin Pen Needle (TRUEPLUS PEN NEEDLES) 32G X 4 MM MISC Use as directed 100 each 12  . Insulin Syringe-Needle U-100 (BD INSULIN SYRINGE ULTRAFINE) 31G X 15/64" 0.5 ML MISC USE AS DIRECTED 100 each 12  . Lancets (FREESTYLE) lancets Use as instructed 100 each 12  . atorvastatin (LIPITOR) 20 MG tablet TAKE 1 TABLET BY MOUTH DAILY AT 6 PM. 90 tablet 0  . Insulin Glargine (LANTUS SOLOSTAR) 100 UNIT/ML Solostar Pen Inject 30 Units  into the skin daily at 10 pm. 15 mL 3  . azithromycin (ZITHROMAX) 250 MG tablet Take 2 today then 1 daily 6 tablet 0  . benzonatate (TESSALON) 200 MG capsule Take 1 capsule (200 mg total) by mouth 2 (two) times daily as needed for cough. 20 capsule 0   No facility-administered medications prior to visit.     ROS Review of Systems  Constitutional: Negative for activity change, appetite change and fatigue.  HENT: Negative for congestion, sinus pressure and sore throat.   Eyes: Negative for visual disturbance.  Respiratory: Negative for cough, chest tightness, shortness of breath and wheezing.   Cardiovascular: Negative for chest pain and palpitations.  Gastrointestinal: Negative for abdominal distention, abdominal pain and constipation.  Endocrine: Negative for polydipsia.  Genitourinary: Negative for dysuria and frequency.  Musculoskeletal:       See hpi  Skin: Negative for rash.  Neurological: Negative for tremors, light-headedness and numbness.  Hematological: Does not bruise/bleed easily.  Psychiatric/Behavioral: Negative for agitation and behavioral problems.    Objective:  BP 111/72 (BP Location: Left Arm, Patient Position: Sitting, Cuff Size: Normal)   Pulse 77   Temp 98.1 F (36.7 C) (Oral)   Resp 18   Ht 5' 1" (1.549 m)   Wt 181 lb (82.1 kg)   LMP 03/16/2017   SpO2 100%   BMI 34.20 kg/m   BP/Weight 03/18/2017 01/22/2017 1/49/7026  Systolic BP 378 588 502  Diastolic BP 72 76  66  Wt. (Lbs) 181 178.2 196  BMI 34.2 33.67 37.03      Physical Exam  Constitutional: She is oriented to person, place, and time. She appears well-developed and well-nourished.  Cardiovascular: Normal rate, normal heart sounds and intact distal pulses.   No murmur heard. Pulmonary/Chest: Effort normal and breath sounds normal. She has no wheezes. She has no rales. She exhibits no tenderness.  Abdominal: Soft. Bowel sounds are normal. She exhibits no distension and no mass. There is no  tenderness.  Musculoskeletal: Normal range of motion.  Normal appearance of both feet and ankle No tenderness on palpation and range of motion bilaterally  Neurological: She is alert and oriented to person, place, and time.     CMP Latest Ref Rng & Units 06/28/2016 10/19/2015 06/15/2014  Glucose 65 - 99 mg/dL 165(H) 107(H) 101(H)  BUN 7 - 25 mg/dL _0 Creatinine 0.50 - 1.10 mg/dL 0.79 0.91 0.72  Sodium 135 - 146 mmol/L 139 138 141  Potassium 3.5 - 5.3 mmol/L 3.8 3.8 4.1  Chloride 98 - 110 mmol/L 102 101 104  CO2 20 - 31 mmol/L _1 Calcium 8.6 - 10.2 mg/dL 9.5 9.4 9.5  Total Protein 6.1 - 8.1 g/dL 6.9 7.2 6.8  Total Bilirubin 0.2 - 1.2 mg/dL 0.7 0.6 0.5  Alkaline Phos 33 - 115 U/L 68 85 73  AST 10 - 30 U/L _2 ALT 6 - 29 U/L _3 Lipid Panel     Component Value Date/Time   CHOL 125 10/19/2015 1001   TRIG 45 10/19/2015 1001   HDL 57 10/19/2015 1001   CHOLHDL 2.2 10/19/2015 1001   VLDL 9 10/19/2015 1001   LDLCALC 59 10/19/2015 1001   Lab Results  Component Value Date   HGBA1C 7.0 03/18/2017      Assessment & Plan:   1. Type 2 diabetes mellitus without complication, with long-term current use of insulin (HCC) Controlled with A1c of 7 (0 This has improved significantly from 11.9 previously Continue Lantus, diabetic diet - POCT A1C - Glucose (CBG) - Insulin Glargine (LANTUS SOLOSTAR) 100 UNIT/ML Solostar Pen; Inject 30 Units into the skin daily at 10 pm.  Dispense: 15 mL; Refill: 3 - Magnesium  2. Hyperlipidemia, unspecified hyperlipidemia type Controlled Low cholesterol diet - atorvastatin (LIPITOR) 20 MG tablet; TAKE 1 TABLET BY MOUTH DAILY AT 6 PM.  Dispense: 90 tablet; Refill: 1 - CMP14+EGFR - Lipid panel  3. Tendinopathy Advised to use  OTC NSAIDs Apply ice and elevate feet.  4. Need for 23-polyvalent pneumococcal polysaccharide vaccine Pneumovax administered   Meds ordered this encounter  Medications  . atorvastatin (LIPITOR)  20 MG tablet    Sig: TAKE 1 TABLET BY MOUTH DAILY AT 6 PM.    Dispense:  90 tablet    Refill:  1    Must have office visit for refills  . Insulin Glargine (LANTUS SOLOSTAR) 100 UNIT/ML Solostar Pen    Sig: Inject 30 Units into the skin daily at 10 pm.    Dispense:  15 mL    Refill:  3    Follow-up: Return in about 3 months (around 06/18/2017) for Follow-up on diabetes mellitus.   This note has been created with Surveyor, quantity. Any transcriptional errors are unintentional.     Arnoldo Morale MD

## 2017-03-18 NOTE — Patient Instructions (Signed)

## 2017-03-19 LAB — CMP14+EGFR
A/G RATIO: 1.3 (ref 1.2–2.2)
ALBUMIN: 4.1 g/dL (ref 3.5–5.5)
ALK PHOS: 86 IU/L (ref 39–117)
ALT: 17 IU/L (ref 0–32)
AST: 25 IU/L (ref 0–40)
BILIRUBIN TOTAL: 0.5 mg/dL (ref 0.0–1.2)
BUN / CREAT RATIO: 16 (ref 9–23)
BUN: 14 mg/dL (ref 6–24)
CO2: 26 mmol/L (ref 20–29)
CREATININE: 0.87 mg/dL (ref 0.57–1.00)
Calcium: 9.7 mg/dL (ref 8.7–10.2)
Chloride: 101 mmol/L (ref 96–106)
GFR calc Af Amer: 93 mL/min/{1.73_m2} (ref >59)
GFR calc non Af Amer: 81 mL/min/{1.73_m2} (ref >59)
GLOBULIN, TOTAL: 3.2 g/dL (ref 1.5–4.5)
Glucose: 106 mg/dL — ABNORMAL HIGH (ref 65–99)
Potassium: 4.4 mmol/L (ref 3.5–5.2)
SODIUM: 142 mmol/L (ref 134–144)
Total Protein: 7.3 g/dL (ref 6.0–8.5)

## 2017-03-19 LAB — LIPID PANEL
CHOL/HDL RATIO: 3 ratio (ref 0.0–4.4)
CHOLESTEROL TOTAL: 195 mg/dL (ref 100–199)
HDL: 65 mg/dL (ref >39)
LDL CALC: 113 mg/dL — AB (ref 0–99)
Triglycerides: 86 mg/dL (ref 0–149)
VLDL Cholesterol Cal: 17 mg/dL (ref 5–40)

## 2017-03-19 LAB — MAGNESIUM: Magnesium: 1.4 mg/dL — ABNORMAL LOW (ref 1.6–2.3)

## 2017-03-20 ENCOUNTER — Other Ambulatory Visit: Payer: Self-pay | Admitting: Family Medicine

## 2017-03-20 MED ORDER — MAGNESIUM 400 MG PO TABS: 400.0000 mg | ORAL_TABLET | Freq: Two times a day (BID) | ORAL | 2 refills | Status: DC

## 2017-04-22 MED FILL — TRUE METRIX TEST STRIP: 15 days supply | Qty: 50 | Fill #4

## 2017-04-22 MED FILL — ATORVASTATIN 20 MG TABLET: 20 | 30 days supply | Qty: 30 | Fill #1

## 2017-05-06 MED FILL — $LANTUS SOLOSTAR 100 UNITS/: 100 | 30 days supply | Qty: 9 | Fill #2

## 2017-05-29 MED FILL — TRUEPLUS PEN NDL 32GX5/32": 32G X 4 MM | 30 days supply | Qty: 100 | Fill #1

## 2017-05-29 MED FILL — TRUEPLUS PEN NDL 32GX5/32: 32G X 4 MM | 30 days supply | Qty: 100 | Fill #1

## 2017-05-29 MED FILL — TRUE METRIX TEST STRIP: 30 days supply | Qty: 100 | Fill #5

## 2017-06-05 MED FILL — ?ATORVASTATIN 20 MG TABLET: 20 | 30 days supply | Qty: 30 | Fill #2

## 2017-06-25 ENCOUNTER — Ambulatory Visit: Payer: Self-pay

## 2017-06-25 ENCOUNTER — Encounter: Payer: Self-pay | Admitting: Family Medicine

## 2017-06-25 ENCOUNTER — Ambulatory Visit: Payer: Self-pay | Attending: Family Medicine | Admitting: Family Medicine

## 2017-06-25 VITALS — BP 96/67 | HR 71 | Temp 98.0°F | Ht 61.0 in | Wt 189.6 lb

## 2017-06-25 DIAGNOSIS — E11649 Type 2 diabetes mellitus with hypoglycemia without coma: Secondary | ICD-10-CM | POA: Insufficient documentation

## 2017-06-25 DIAGNOSIS — Z794 Long term (current) use of insulin: Secondary | ICD-10-CM | POA: Insufficient documentation

## 2017-06-25 DIAGNOSIS — E119 Type 2 diabetes mellitus without complications: Secondary | ICD-10-CM

## 2017-06-25 DIAGNOSIS — Z6835 Body mass index (BMI) 35.0-35.9, adult: Secondary | ICD-10-CM | POA: Insufficient documentation

## 2017-06-25 DIAGNOSIS — Z23 Encounter for immunization: Secondary | ICD-10-CM | POA: Insufficient documentation

## 2017-06-25 DIAGNOSIS — E785 Hyperlipidemia, unspecified: Secondary | ICD-10-CM | POA: Insufficient documentation

## 2017-06-25 DIAGNOSIS — E669 Obesity, unspecified: Secondary | ICD-10-CM | POA: Insufficient documentation

## 2017-06-25 DIAGNOSIS — Z79899 Other long term (current) drug therapy: Secondary | ICD-10-CM | POA: Insufficient documentation

## 2017-06-25 DIAGNOSIS — K219 Gastro-esophageal reflux disease without esophagitis: Secondary | ICD-10-CM | POA: Insufficient documentation

## 2017-06-25 LAB — GLUCOSE, POCT (MANUAL RESULT ENTRY): POC Glucose: 95 mg/dl (ref 70–99)

## 2017-06-25 LAB — POCT GLYCOSYLATED HEMOGLOBIN (HGB A1C): Hemoglobin A1C: 6.1

## 2017-06-25 MED ORDER — ATORVASTATIN CALCIUM 20 MG PO TABS: ORAL_TABLET | ORAL | 1 refills | Status: DC

## 2017-06-25 MED ORDER — INSULIN GLARGINE 100 UNIT/ML SOLOSTAR PEN: 20.0000 [IU] | PEN_INJECTOR | Freq: Every day | SUBCUTANEOUS | 3 refills | Status: DC

## 2017-06-25 MED FILL — $LANTUS SOLOSTAR 100 UNITS/: 100 | 30 days supply | Qty: 6 | Fill #0

## 2017-06-25 MED FILL — ?ATORVASTATIN 20 MG TABLET: 20 | 30 days supply | Qty: 30 | Fill #0

## 2017-06-25 NOTE — Progress Notes (Signed)
Subjective:  Patient ID: Krista Campbell, female    DOB: [birth year]  Age: 46 y.o. MRN: [id number]  CC: Diabetes   HPI Krista Campbell is a  46 year old female with history of type 2 diabetes mellitus (A1c 6.1 which has improved from 7.0 previously), hyperlipidemia, obesity who presents today for follow-up visit.  She noticed hypoglycemia with sugars dropping to 68 and 70s which led to her reducing her Lantus dose from 30 to 20 units with improvement in fasting blood sugars to the 100s. Denies numbness in extremities or visual complaints. Yet to have an annual eye exam.  She has been compliant with her Statin for hyperlipidemia and denies myalgia.  She has noticed intermittent sour taste in her mouth and some reflux for which she takes tums; she would not like to be placed on a prescription medication yet. Denies nausea, abdominal pain, constipation.  Past Medical History:  Diagnosis Date  . Diabetes mellitus without complication ([place])   . Hyperlipidemia     No past surgical history on file.  No Known Allergies   Outpatient Medications Prior to Visit  Medication Sig Dispense Refill  . Blood Glucose Monitoring Suppl (TRUE METRIX AIR GLUCOSE METER) DEVI 1 Device by Does not apply route 4 (four) times daily - after meals and at bedtime. 1 Device 0  . glucose blood test strip Use as instructed 100 each 12  . Insulin Pen Needle (RELION PEN NEEDLES) 29G X 12MM MISC For BID doses 100 each 0  . Insulin Pen Needle (TRUEPLUS PEN NEEDLES) 32G X 4 MM MISC Use as directed 100 each 12  . Insulin Syringe-Needle U-100 (BD INSULIN SYRINGE ULTRAFINE) 31G X 15/64" 0.5 ML MISC USE AS DIRECTED 100 each 12  . Lancets (FREESTYLE) lancets Use as instructed 100 each 12  . atorvastatin (LIPITOR) 20 MG tablet TAKE 1 TABLET BY MOUTH DAILY AT 6 PM. 90 tablet 1  . Insulin Glargine (LANTUS SOLOSTAR) 100 UNIT/ML Solostar Pen Inject 30 Units into the skin daily at 10 pm. 15 mL 3  . Magnesium 400 MG TABS  Take 400 mg by mouth 2 (two) times daily. (Patient not taking: Reported on 06/25/2017) 60 tablet 2   No facility-administered medications prior to visit.     ROS Review of Systems  Constitutional: Negative for activity change, appetite change and fatigue.  HENT: Negative for congestion, sinus pressure and sore throat.   Eyes: Negative for visual disturbance.  Respiratory: Negative for cough, chest tightness, shortness of breath and wheezing.   Cardiovascular: Negative for chest pain and palpitations.  Gastrointestinal: Negative for abdominal distention, abdominal pain and constipation.  Endocrine: Negative for polydipsia.  Genitourinary: Negative for dysuria and frequency.  Musculoskeletal: Negative for arthralgias and back pain.  Skin: Negative for rash.  Neurological: Negative for tremors, light-headedness and numbness.  Hematological: Does not bruise/bleed easily.  Psychiatric/Behavioral: Negative for agitation and behavioral problems.    Objective:  BP 96/67   Pulse 71   Temp 98 F (36.7 C) (Oral)   Ht 5\' 1"  (1.549 m)   Wt 189 lb 9.6 oz (86 kg)   SpO2 100%   BMI 35.82 kg/m   BP/Weight 06/25/2017 03/18/2017 0/73/7106  Systolic BP 96 269 485  Diastolic BP 67 72 76  Wt. (Lbs) 189.6 181 178.2  BMI 35.82 34.2 33.67      Physical Exam  Constitutional: She is oriented to person, place, and time. She appears well-developed and well-nourished.  Cardiovascular: Normal rate, normal heart sounds and  intact distal pulses.  No murmur heard. Pulmonary/Chest: Effort normal and breath sounds normal. She has no wheezes. She has no rales. She exhibits no tenderness.  Abdominal: Soft. Bowel sounds are normal. She exhibits no distension and no mass. There is no tenderness.  Musculoskeletal: Normal range of motion.  Neurological: She is alert and oriented to person, place, and time.  Skin: Skin is warm and dry.  Psychiatric: She has a normal mood and affect.     CMP Latest Ref Rng  & Units 03/18/2017 06/28/2016 10/19/2015  Glucose 65 - 99 mg/dL 106(H) 165(H) 107(H)  BUN 6 - 24 mg/dL 14 11 11   Creatinine 0.57 - 1.00 mg/dL 0.87 0.79 0.91  Sodium 134 - 144 mmol/L 142 139 138  Potassium 3.5 - 5.2 mmol/L 4.4 3.8 3.8  Chloride 96 - 106 mmol/L 101 102 101  CO2 20 - 29 mmol/L 26 29 29   Calcium 8.7 - 10.2 mg/dL 9.7 9.5 9.4  Total Protein 6.0 - 8.5 g/dL 7.3 6.9 7.2  Total Bilirubin 0.0 - 1.2 mg/dL 0.5 0.7 0.6  Alkaline Phos 39 - 117 IU/L 86 68 85  AST 0 - 40 IU/L 25 22 24   ALT 0 - 32 IU/L 17 18 23     Lipid Panel     Component Value Date/Time   CHOL 195 03/18/2017 1025   TRIG 86 03/18/2017 1025   HDL 65 03/18/2017 1025   CHOLHDL 3.0 03/18/2017 1025   CHOLHDL 2.2 10/19/2015 1001   VLDL 9 10/19/2015 1001   LDLCALC 113 (H) 03/18/2017 1025    Lab Results  Component Value Date   HGBA1C 6.1 06/25/2017    Assessment & Plan:   1. Type 2 diabetes mellitus without complication, with long-term current use of insulin (HCC) Controlled with A1c of 6.1 Continue reduced dose of Lantus 20 units to prevent hypoglycemia Reviewed hypoglycemia protocol Plans to schedule annual eye exam - POCT glucose (manual entry) - POCT glycosylated hemoglobin (Hb A1C) - Insulin Glargine (LANTUS SOLOSTAR) 100 UNIT/ML Solostar Pen; Inject 20 Units daily at 10 pm into the skin.  Dispense: 15 mL; Refill: 3  2. Hyperlipidemia, unspecified hyperlipidemia type Controlled Low cholesterol diet - atorvastatin (LIPITOR) 20 MG tablet; TAKE 1 TABLET BY MOUTH DAILY AT 6 PM.  Dispense: 90 tablet; Refill: 1  3. Need for influenza vaccination Flu shot administered.   4. GERD Avoid late meals and acidic foods She would like to hold off on initiation of PPI at this time.  Meds ordered this encounter  Medications  . Insulin Glargine (LANTUS SOLOSTAR) 100 UNIT/ML Solostar Pen    Sig: Inject 20 Units daily at 10 pm into the skin.    Dispense:  15 mL    Refill:  3    Discontinue previous dose  .  atorvastatin (LIPITOR) 20 MG tablet    Sig: TAKE 1 TABLET BY MOUTH DAILY AT 6 PM.    Dispense:  90 tablet    Refill:  1    Must have office visit for refills    Follow-up: Return in about 3 months (around 09/25/2017) for follow Up on diabetes mellitus.   Arnoldo Morale MD

## 2017-06-25 NOTE — Patient Instructions (Signed)

## 2017-06-27 ENCOUNTER — Encounter: Payer: Self-pay | Admitting: Family Medicine

## 2017-07-30 MED FILL — $LANTUS SOLOSTAR 100 UNITS/: 100 | 30 days supply | Qty: 6 | Fill #1

## 2017-08-14 MED FILL — ?ATORVASTATIN 20 MG TABLET: 20 | 30 days supply | Qty: 30 | Fill #1

## 2017-08-19 MED FILL — $LANTUS SOLOSTAR 100 UNITS/: 100 | 30 days supply | Qty: 6 | Fill #2

## 2017-09-18 ENCOUNTER — Other Ambulatory Visit: Payer: Self-pay | Admitting: Family Medicine

## 2017-09-18 DIAGNOSIS — E119 Type 2 diabetes mellitus without complications: Secondary | ICD-10-CM

## 2017-09-18 MED FILL — TRUE METRIX TEST STRIP: 30 days supply | Qty: 100 | Fill #0

## 2017-09-19 ENCOUNTER — Encounter: Payer: Self-pay | Admitting: Family Medicine

## 2017-09-19 ENCOUNTER — Ambulatory Visit: Payer: Self-pay | Attending: Family Medicine | Admitting: Family Medicine

## 2017-09-19 VITALS — BP 111/76 | HR 77 | Temp 98.2°F | Resp 16 | Ht 61.0 in | Wt 192.0 lb

## 2017-09-19 DIAGNOSIS — N898 Other specified noninflammatory disorders of vagina: Secondary | ICD-10-CM

## 2017-09-19 DIAGNOSIS — L298 Other pruritus: Secondary | ICD-10-CM | POA: Insufficient documentation

## 2017-09-19 DIAGNOSIS — E669 Obesity, unspecified: Secondary | ICD-10-CM | POA: Insufficient documentation

## 2017-09-19 DIAGNOSIS — Z79899 Other long term (current) drug therapy: Secondary | ICD-10-CM | POA: Insufficient documentation

## 2017-09-19 DIAGNOSIS — E119 Type 2 diabetes mellitus without complications: Secondary | ICD-10-CM | POA: Insufficient documentation

## 2017-09-19 DIAGNOSIS — E785 Hyperlipidemia, unspecified: Secondary | ICD-10-CM | POA: Insufficient documentation

## 2017-09-19 DIAGNOSIS — Z0189 Encounter for other specified special examinations: Secondary | ICD-10-CM | POA: Insufficient documentation

## 2017-09-19 DIAGNOSIS — Z794 Long term (current) use of insulin: Secondary | ICD-10-CM | POA: Insufficient documentation

## 2017-09-19 LAB — GLUCOSE, POCT (MANUAL RESULT ENTRY): POC GLUCOSE: 108 mg/dL — AB (ref 70–99)

## 2017-09-19 LAB — POCT GLYCOSYLATED HEMOGLOBIN (HGB A1C): HEMOGLOBIN A1C: 6.3

## 2017-09-19 MED ORDER — ATORVASTATIN CALCIUM 20 MG PO TABS: ORAL_TABLET | ORAL | 1 refills | Status: DC

## 2017-09-19 MED ORDER — INSULIN GLARGINE 100 UNIT/ML SOLOSTAR PEN: 20.0000 [IU] | PEN_INJECTOR | Freq: Every day | SUBCUTANEOUS | 3 refills | Status: DC

## 2017-09-19 MED ORDER — FLUCONAZOLE 150 MG PO TABS: 150.0000 mg | ORAL_TABLET | Freq: Once | ORAL | 0 refills | Status: AC

## 2017-09-19 MED FILL — FLUCONAZOLE 150 MG TABLET: 150 | 1 days supply | Qty: 1 | Fill #0

## 2017-09-19 MED FILL — $LANTUS SOLOSTAR 100 UNITS/: 100 | 30 days supply | Qty: 6 | Fill #0

## 2017-09-19 MED FILL — ?ATORVASTATIN 20 MG TABLET: 20 | 30 days supply | Qty: 30 | Fill #0

## 2017-09-19 NOTE — Patient Instructions (Signed)

## 2017-09-19 NOTE — Progress Notes (Signed)
Subjective:  Patient ID: Krista Campbell, female    DOB: [birth year]  Age: 47 y.o. MRN: [id number]  CC: Follow-up and Vaginal Itching   HPI Krista Campbell is a  47 year old female with history of type 2 diabetes mellitus (A1c 6.3 ), hyperlipidemia, obesity who presents today for follow-up visit.  She complains of a one-week history of vaginal itching and burning which has improved recently but she still has residual symptoms.  Denies vaginal discharge, dysuria or abdominal pain. She noticed symptoms were more when her sugars wiere a bit high which she describes as in the 180 range.  With regards to her diabetes mellitus she has been compliant with Lantus and describes only one hypoglycemic episode of 61 in which ate some food and sugar improved.  She denies visual concerns but is not up-to-date on annual eye exam, denies numbness in extremities.  She tolerates her statin and denies myalgias or adverse effects from it. She does not exercise regularly. Denies chest pains, shortness of breath or pedal edema.  Past Medical History:  Diagnosis Date  . Diabetes mellitus without complication ([place])   . Hyperlipidemia     History reviewed. No pertinent surgical history.  No Known Allergies   Outpatient Medications Prior to Visit  Medication Sig Dispense Refill  . Blood Glucose Monitoring Suppl (TRUE METRIX AIR GLUCOSE METER) DEVI 1 Device by Does not apply route 4 (four) times daily - after meals and at bedtime. 1 Device 0  . glucose blood test strip Use as instructed 100 each 12  . Insulin Pen Needle (RELION PEN NEEDLES) 29G X 12MM MISC For BID doses 100 each 0  . Insulin Pen Needle (TRUEPLUS PEN NEEDLES) 32G X 4 MM MISC Use as directed 100 each 12  . Insulin Syringe-Needle U-100 (BD INSULIN SYRINGE ULTRAFINE) 31G X 15/64" 0.5 ML MISC USE AS DIRECTED 100 each 12  . Lancets (FREESTYLE) lancets Use as instructed 100 each 12  . Magnesium 500 MG TABS Take by mouth.    Marland Kitchen atorvastatin  (LIPITOR) 20 MG tablet TAKE 1 TABLET BY MOUTH DAILY AT 6 PM. 90 tablet 1  . Insulin Glargine (LANTUS SOLOSTAR) 100 UNIT/ML Solostar Pen Inject 20 Units daily at 10 pm into the skin. 15 mL 3  . Magnesium 400 MG TABS Take 400 mg by mouth 2 (two) times daily. (Patient not taking: Reported on 06/25/2017) 60 tablet 2   No facility-administered medications prior to visit.     ROS Review of Systems  Constitutional: Negative for activity change, appetite change and fatigue.  HENT: Negative for congestion, sinus pressure and sore throat.   Eyes: Negative for visual disturbance.  Respiratory: Negative for cough, chest tightness, shortness of breath and wheezing.   Cardiovascular: Negative for chest pain and palpitations.  Gastrointestinal: Negative for abdominal distention, abdominal pain and constipation.  Endocrine: Negative for polydipsia.  Genitourinary: Negative for dysuria, frequency and vaginal discharge.  Musculoskeletal: Negative for arthralgias and back pain.  Skin: Negative for rash.  Neurological: Negative for tremors, light-headedness and numbness.  Hematological: Does not bruise/bleed easily.  Psychiatric/Behavioral: Negative for agitation and behavioral problems.    Objective:  BP 111/76 (BP Location: Right Arm, Patient Position: Sitting, Cuff Size: Normal)   Pulse 77   Temp 98.2 F (36.8 C) (Oral)   Resp 16   Ht '5\' 1"'  (1.549 m)   Wt 192 lb (87.1 kg)   LMP 09/12/2017   SpO2 99%   BMI 36.28 kg/m   BP/Weight 09/19/2017  28/36/6294 02/16/5464  Systolic BP 035 96 465  Diastolic BP 76 67 72  Wt. (Lbs) 192 189.6 181  BMI 36.28 35.82 34.2      Physical Exam  Constitutional: She is oriented to person, place, and time. She appears well-developed and well-nourished.  Cardiovascular: Normal rate, normal heart sounds and intact distal pulses.  No murmur heard. Pulmonary/Chest: Effort normal and breath sounds normal. She has no wheezes. She has no rales. She exhibits no  tenderness.  Abdominal: Soft. Bowel sounds are normal. She exhibits no distension and no mass. There is no tenderness.  Musculoskeletal: Normal range of motion.  Neurological: She is alert and oriented to person, place, and time.  Skin: Skin is warm and dry.  Psychiatric: She has a normal mood and affect.     CMP Latest Ref Rng & Units 03/18/2017 06/28/2016 10/19/2015  Glucose 65 - 99 mg/dL 106(H) 165(H) 107(H)  BUN 6 - 24 mg/dL '14 11 11  ' Creatinine 0.57 - 1.00 mg/dL 0.87 0.79 0.91  Sodium 134 - 144 mmol/L 142 139 138  Potassium 3.5 - 5.2 mmol/L 4.4 3.8 3.8  Chloride 96 - 106 mmol/L 101 102 101  CO2 20 - 29 mmol/L '26 29 29  ' Calcium 8.7 - 10.2 mg/dL 9.7 9.5 9.4  Total Protein 6.0 - 8.5 g/dL 7.3 6.9 7.2  Total Bilirubin 0.0 - 1.2 mg/dL 0.5 0.7 0.6  Alkaline Phos 39 - 117 IU/L 86 68 85  AST 0 - 40 IU/L '25 22 24  ' ALT 0 - 32 IU/L '17 18 23    ' Lipid Panel     Component Value Date/Time   CHOL 195 03/18/2017 1025   TRIG 86 03/18/2017 1025   HDL 65 03/18/2017 1025   CHOLHDL 3.0 03/18/2017 1025   CHOLHDL 2.2 10/19/2015 1001   VLDL 9 10/19/2015 1001   LDLCALC 113 (H) 03/18/2017 1025    Lab Results  Component Value Date   HGBA1C 6.3 09/19/2017    Assessment & Plan:   1. Type 2 diabetes mellitus without complication, with long-term current use of insulin (HCC) Controlled with A1c of 6.3, continue current regimen We have discussed community resources available for annual eye exam as she has no medical coverage Counseled on Diabetic diet, my plate method, 681 minutes of moderate intensity exercise/week Keep blood sugar logs with fasting goals of 80-120 mg/dl, random of less than 180 and in the event of sugars less than 60 mg/dl or greater than 400 mg/dl please notify the clinic ASAP. It is recommended that you undergo annual eye exams and annual foot exams. Pneumonia vaccine is recommended. - Glucose (CBG) - HgB A1c - Insulin Glargine (LANTUS SOLOSTAR) 100 UNIT/ML Solostar Pen;  Inject 20 Units into the skin daily at 10 pm.  Dispense: 15 mL; Refill: 3 - CMP14+EGFR - Microalbumin/Creatinine Ratio, Urine  2. Hyperlipidemia, unspecified hyperlipidemia type LDL is 113, slightly above goal of less than 100 Lipid panel at next visit and will adjust regimen if indicated Low-cholesterol diet - atorvastatin (LIPITOR) 20 MG tablet; TAKE 1 TABLET BY MOUTH DAILY AT 6 PM.  Dispense: 90 tablet; Refill: 1  3. Vaginal itching Placed on Diflucan presumptively - Urine cytology ancillary only   Meds ordered this encounter  Medications  . Insulin Glargine (LANTUS SOLOSTAR) 100 UNIT/ML Solostar Pen    Sig: Inject 20 Units into the skin daily at 10 pm.    Dispense:  15 mL    Refill:  3  . atorvastatin (LIPITOR) 20 MG tablet  Sig: TAKE 1 TABLET BY MOUTH DAILY AT 6 PM.    Dispense:  90 tablet    Refill:  1  . fluconazole (DIFLUCAN) 150 MG tablet    Sig: Take 1 tablet (150 mg total) by mouth once for 1 dose.    Dispense:  1 tablet    Refill:  0    Follow-up: Return in about 3 months (around 12/17/2017) for follow up on diabetes mellitus.   Charlott Rakes MD

## 2017-09-19 NOTE — Progress Notes (Signed)
Patient is here for a follow-up.  Patient have a concern she had vaginal itching and burns when she urinates. Patient think it may be due to her blood sugar being high and lack of drinking water.

## 2017-09-20 LAB — CMP14+EGFR
A/G RATIO: 1.3 (ref 1.2–2.2)
ALT: 39 IU/L — AB (ref 0–32)
AST: 41 IU/L — AB (ref 0–40)
Albumin: 4.3 g/dL (ref 3.5–5.5)
Alkaline Phosphatase: 104 IU/L (ref 39–117)
BUN/Creatinine Ratio: 10 (ref 9–23)
BUN: 9 mg/dL (ref 6–24)
Bilirubin Total: 0.5 mg/dL (ref 0.0–1.2)
CALCIUM: 9.1 mg/dL (ref 8.7–10.2)
CO2: 26 mmol/L (ref 20–29)
CREATININE: 0.86 mg/dL (ref 0.57–1.00)
Chloride: 101 mmol/L (ref 96–106)
GFR, EST AFRICAN AMERICAN: 94 mL/min/{1.73_m2} (ref >59)
GFR, EST NON AFRICAN AMERICAN: 81 mL/min/{1.73_m2} (ref >59)
Globulin, Total: 3.2 g/dL (ref 1.5–4.5)
Glucose: 89 mg/dL (ref 65–99)
POTASSIUM: 4 mmol/L (ref 3.5–5.2)
Sodium: 141 mmol/L (ref 134–144)
TOTAL PROTEIN: 7.5 g/dL (ref 6.0–8.5)

## 2017-09-20 LAB — MICROALBUMIN / CREATININE URINE RATIO
Creatinine, Urine: 114.3 mg/dL
MICROALB/CREAT RATIO: 12 mg/g{creat} (ref 0.0–30.0)
MICROALBUM., U, RANDOM: 13.7 ug/mL

## 2017-09-23 LAB — URINE CYTOLOGY ANCILLARY ONLY
Chlamydia: NEGATIVE
NEISSERIA GONORRHEA: NEGATIVE
Trichomonas: POSITIVE — AB

## 2017-09-25 ENCOUNTER — Other Ambulatory Visit: Payer: Self-pay | Admitting: Family Medicine

## 2017-09-25 LAB — URINE CYTOLOGY ANCILLARY ONLY: CANDIDA VAGINITIS: NEGATIVE

## 2017-09-25 MED ORDER — METRONIDAZOLE 0.75 % VA GEL: 1.0000 | Freq: Every day | VAGINAL | 0 refills | Status: DC

## 2017-09-25 MED FILL — VANDAZOLE VAGINAL 0.75% GEL: 0.75 | 5 days supply | Qty: 70 | Fill #0

## 2017-10-30 MED FILL — ?ATORVASTATIN 20 MG TABLET: 20 | 30 days supply | Qty: 30 | Fill #2

## 2017-11-06 ENCOUNTER — Telehealth: Payer: Self-pay | Admitting: Family Medicine

## 2017-11-06 MED FILL — $LANTUS SOLOSTAR 100 UNITS/: 100 | 30 days supply | Qty: 6 | Fill #3

## 2017-11-06 MED FILL — TRUE METRIX TEST STRIP: 30 days supply | Qty: 100 | Fill #1

## 2017-11-06 NOTE — Telephone Encounter (Signed)
Pt. Called requesting a letter from PCP stating that she takes insulin and needs her lights. Pt. States she needs the letter for assistance with paying her lights. Pt. Also stated that her insulin needs to be refrigerated. Please f/u

## 2017-11-07 NOTE — Telephone Encounter (Signed)
Noted. Will route to PCP 

## 2017-11-07 NOTE — Telephone Encounter (Signed)
Pt came by to add information to her letter. She would like the letter to say that she is diabetic, and that she is insulin dependent and the insulin requires refrigeration. If the letter is approved send to [facility] Fax :[phone] [email or web address] .Radonna Ricker

## 2017-11-07 NOTE — Telephone Encounter (Signed)
Will route to PCP 

## 2017-11-08 ENCOUNTER — Other Ambulatory Visit: Payer: Self-pay | Admitting: Family Medicine

## 2017-11-08 NOTE — Telephone Encounter (Signed)
Could you please fax this letter as requested?  Thank you

## 2017-11-11 NOTE — Telephone Encounter (Signed)
Letter has been faxed over to number provided. 

## 2017-12-12 MED FILL — ?ATORVASTATIN 20 MG TABLET: 20 | 30 days supply | Qty: 30 | Fill #3

## 2017-12-12 MED FILL — $LANTUS SOLOSTAR 100 UNITS/: 100 | 30 days supply | Qty: 6 | Fill #4

## 2017-12-17 ENCOUNTER — Ambulatory Visit: Payer: Self-pay | Attending: Family Medicine | Admitting: Family Medicine

## 2017-12-17 ENCOUNTER — Encounter: Payer: Self-pay | Admitting: Family Medicine

## 2017-12-17 VITALS — BP 109/76 | HR 84 | Temp 98.2°F | Ht 61.0 in | Wt 206.8 lb

## 2017-12-17 DIAGNOSIS — E669 Obesity, unspecified: Secondary | ICD-10-CM | POA: Insufficient documentation

## 2017-12-17 DIAGNOSIS — E785 Hyperlipidemia, unspecified: Secondary | ICD-10-CM | POA: Insufficient documentation

## 2017-12-17 DIAGNOSIS — E119 Type 2 diabetes mellitus without complications: Secondary | ICD-10-CM | POA: Insufficient documentation

## 2017-12-17 DIAGNOSIS — Z79899 Other long term (current) drug therapy: Secondary | ICD-10-CM | POA: Insufficient documentation

## 2017-12-17 DIAGNOSIS — E1165 Type 2 diabetes mellitus with hyperglycemia: Secondary | ICD-10-CM

## 2017-12-17 DIAGNOSIS — Z6839 Body mass index (BMI) 39.0-39.9, adult: Secondary | ICD-10-CM | POA: Insufficient documentation

## 2017-12-17 DIAGNOSIS — Z794 Long term (current) use of insulin: Secondary | ICD-10-CM | POA: Insufficient documentation

## 2017-12-17 LAB — POCT GLYCOSYLATED HEMOGLOBIN (HGB A1C): HEMOGLOBIN A1C: 6.8

## 2017-12-17 LAB — GLUCOSE, POCT (MANUAL RESULT ENTRY): POC GLUCOSE: 214 mg/dL — AB (ref 70–99)

## 2017-12-17 MED ORDER — INSULIN GLARGINE 100 UNIT/ML SOLOSTAR PEN: 20.0000 [IU] | PEN_INJECTOR | Freq: Every day | SUBCUTANEOUS | 3 refills | Status: DC

## 2017-12-17 MED ORDER — ATORVASTATIN CALCIUM 20 MG PO TABS: ORAL_TABLET | ORAL | 1 refills | Status: DC

## 2017-12-17 NOTE — Progress Notes (Signed)
Subjective:  Patient ID: Krista Campbell, female    DOB: 1971-01-02  Age: 47 y.o. MRN: [id number]  CC: Diabetes   HPI Krista Campbell is a  47 year old female with history of type 2 diabetes mellitus (A1c 6.8), hyperlipidemia, obesity who presents today for a follow-up visit. She endorses compliance with her medications but her A1c is 6.8 which is up from 6.3 previously and she attributes this to cooking at home as she was previously eating out and she now eats extra meals. She does not exercise and has gained 14 pounds in the last 3 months. Denies visual concerns but is not up-to-date on her annual eye exam. She complains of intermittent episodes of posterior right knee tingling which she states is a sign of low magnesium and has responded to magnesium tablets in the past.  She has been taking OTC magnesium pills and is wondering if she can keep taking this. She is compliant with her statin and denies myalgias.  Past Medical History:  Diagnosis Date  . Diabetes mellitus without complication ([place])   . Hyperlipidemia     History reviewed. No pertinent surgical history.  No Known Allergies   Outpatient Medications Prior to Visit  Medication Sig Dispense Refill  . Blood Glucose Monitoring Suppl (TRUE METRIX AIR GLUCOSE METER) DEVI 1 Device by Does not apply route 4 (four) times daily - after meals and at bedtime. 1 Device 0  . glucose blood test strip Use as instructed 100 each 12  . Insulin Pen Needle (RELION PEN NEEDLES) 29G X 12MM MISC For BID doses 100 each 0  . Insulin Pen Needle (TRUEPLUS PEN NEEDLES) 32G X 4 MM MISC Use as directed 100 each 12  . Insulin Syringe-Needle U-100 (BD INSULIN SYRINGE ULTRAFINE) 31G X 15/64" 0.5 ML MISC USE AS DIRECTED 100 each 12  . Lancets (FREESTYLE) lancets Use as instructed 100 each 12  . atorvastatin (LIPITOR) 20 MG tablet TAKE 1 TABLET BY MOUTH DAILY AT 6 PM. 90 tablet 1  . Insulin Glargine (LANTUS SOLOSTAR) 100 UNIT/ML Solostar Pen  Inject 20 Units into the skin daily at 10 pm. 15 mL 3  . Magnesium 400 MG TABS Take 400 mg by mouth 2 (two) times daily. (Patient not taking: Reported on 06/25/2017) 60 tablet 2  . Magnesium 500 MG TABS Take by mouth.    . metroNIDAZOLE (METROGEL VAGINAL) 0.75 % vaginal gel Place 1 Applicatorful vaginally at bedtime. (Patient not taking: Reported on 12/17/2017) 70 g 0   No facility-administered medications prior to visit.     ROS Review of Systems  Constitutional: Negative for activity change, appetite change and fatigue.  HENT: Negative for congestion, sinus pressure and sore throat.   Eyes: Negative for visual disturbance.  Respiratory: Negative for cough, chest tightness, shortness of breath and wheezing.   Cardiovascular: Negative for chest pain and palpitations.  Gastrointestinal: Negative for abdominal distention, abdominal pain and constipation.  Endocrine: Negative for polydipsia.  Genitourinary: Negative for dysuria and frequency.  Musculoskeletal: Negative for arthralgias and back pain.  Skin: Negative for rash.  Neurological: Negative for tremors, light-headedness and numbness.  Hematological: Does not bruise/bleed easily.  Psychiatric/Behavioral: Negative for agitation and behavioral problems.    Objective:  BP 109/76   Pulse 84   Temp 98.2 F (36.8 C) (Oral)   Ht _0  (1.549 m)   Wt 206 lb 12.8 oz (93.8 kg)   SpO2 97%   BMI 39.07 kg/m   BP/Weight 12/17/2017 09/19/2017 06/25/2017  Systolic BP 433 295 96  Diastolic BP 76 76 67  Wt. (Lbs) 206.8 192 189.6  BMI 39.07 36.28 35.82     Physical Exam  Constitutional: She is oriented to person, place, and time. She appears well-developed and well-nourished.  Cardiovascular: Normal rate, normal heart sounds and intact distal pulses.  No murmur heard. Pulmonary/Chest: Effort normal and breath sounds normal. She has no wheezes. She has no rales. She exhibits no tenderness.  Abdominal: Soft. Bowel sounds are normal. She  exhibits no distension and no mass. There is no tenderness.  Musculoskeletal: Normal range of motion.  Neurological: She is alert and oriented to person, place, and time.     CMP Latest Ref Rng & Units 09/19/2017 03/18/2017 06/28/2016  Glucose 65 - 99 mg/dL 89 106(H) 165(H)  BUN 6 - 24 mg/dL _0 Creatinine 0.57 - 1.00 mg/dL 0.86 0.87 0.79  Sodium 134 - 144 mmol/L 141 142 139  Potassium 3.5 - 5.2 mmol/L 4.0 4.4 3.8  Chloride 96 - 106 mmol/L 101 101 102  CO2 20 - 29 mmol/L _1 Calcium 8.7 - 10.2 mg/dL 9.1 9.7 9.5  Total Protein 6.0 - 8.5 g/dL 7.5 7.3 6.9  Total Bilirubin 0.0 - 1.2 mg/dL 0.5 0.5 0.7  Alkaline Phos 39 - 117 IU/L 104 86 68  AST 0 - 40 IU/L 41(H) 25 22  ALT 0 - 32 IU/L 39(H) 17 18    Lipid Panel     Component Value Date/Time   CHOL 195 03/18/2017 1025   TRIG 86 03/18/2017 1025   HDL 65 03/18/2017 1025   CHOLHDL 3.0 03/18/2017 1025   CHOLHDL 2.2 10/19/2015 1001   VLDL 9 10/19/2015 1001   LDLCALC 113 (H) 03/18/2017 1025    Lab Results  Component Value Date   HGBA1C 6.8 12/17/2017    Assessment & Plan:   1. Type 2 diabetes mellitus without complication, with long-term current use of insulin (HCC) Controlled with A1c of 6.8 Counseled on Diabetic diet, my plate method, 188 minutes of moderate intensity exercise/week Keep blood sugar logs with fasting goals of 80-120 mg/dl, random of less than 180 and in the event of sugars less than 60 mg/dl or greater than 400 mg/dl please notify the clinic ASAP. It is recommended that you undergo annual eye exams and annual foot exams. Pneumonia vaccine is recommended. - POCT glucose (manual entry) - POCT glycosylated hemoglobin (Hb A1C) - CMP14+EGFR; Future - Lipid panel; Future - Insulin Glargine (LANTUS SOLOSTAR) 100 UNIT/ML Solostar Pen; Inject 20 Units into the skin daily at 10 pm.  Dispense: 15 mL; Refill: 3 - Magnesium; Future  2. Hyperlipidemia, unspecified hyperlipidemia type - atorvastatin (LIPITOR) 20  MG tablet; TAKE 1 TABLET BY MOUTH DAILY AT 6 PM.  Dispense: 90 tablet; Refill: 1   Meds ordered this encounter  Medications  . Insulin Glargine (LANTUS SOLOSTAR) 100 UNIT/ML Solostar Pen    Sig: Inject 20 Units into the skin daily at 10 pm.    Dispense:  15 mL    Refill:  3  . atorvastatin (LIPITOR) 20 MG tablet    Sig: TAKE 1 TABLET BY MOUTH DAILY AT 6 PM.    Dispense:  90 tablet    Refill:  1    Follow-up: Return in about 3 months (around 03/19/2018) for Follow-up on diabetes mellitus.   Charlott Rakes MD

## 2017-12-17 NOTE — Patient Instructions (Signed)

## 2017-12-23 ENCOUNTER — Ambulatory Visit: Payer: Self-pay | Attending: Family Medicine

## 2017-12-23 DIAGNOSIS — E119 Type 2 diabetes mellitus without complications: Secondary | ICD-10-CM | POA: Insufficient documentation

## 2017-12-23 DIAGNOSIS — Z794 Long term (current) use of insulin: Secondary | ICD-10-CM | POA: Insufficient documentation

## 2017-12-23 NOTE — Progress Notes (Signed)
Patient here for lab visit only 

## 2017-12-24 LAB — CMP14+EGFR
ALBUMIN: 4.5 g/dL (ref 3.5–5.5)
ALK PHOS: 88 IU/L (ref 39–117)
ALT: 17 IU/L (ref 0–32)
AST: 23 IU/L (ref 0–40)
Albumin/Globulin Ratio: 1.6 (ref 1.2–2.2)
BILIRUBIN TOTAL: 0.7 mg/dL (ref 0.0–1.2)
BUN / CREAT RATIO: 12 (ref 9–23)
BUN: 12 mg/dL (ref 6–24)
CHLORIDE: 99 mmol/L (ref 96–106)
CO2: 24 mmol/L (ref 20–29)
Calcium: 9.6 mg/dL (ref 8.7–10.2)
Creatinine, Ser: 0.99 mg/dL (ref 0.57–1.00)
GFR calc Af Amer: 79 mL/min/{1.73_m2} (ref >59)
GFR calc non Af Amer: 69 mL/min/{1.73_m2} (ref >59)
GLOBULIN, TOTAL: 2.9 g/dL (ref 1.5–4.5)
Glucose: 115 mg/dL — ABNORMAL HIGH (ref 65–99)
POTASSIUM: 3.8 mmol/L (ref 3.5–5.2)
SODIUM: 140 mmol/L (ref 134–144)
Total Protein: 7.4 g/dL (ref 6.0–8.5)

## 2017-12-24 LAB — LIPID PANEL
CHOL/HDL RATIO: 2.6 ratio (ref 0.0–4.4)
Cholesterol, Total: 159 mg/dL (ref 100–199)
HDL: 62 mg/dL (ref >39)
LDL CALC: 87 mg/dL (ref 0–99)
Triglycerides: 52 mg/dL (ref 0–149)
VLDL CHOLESTEROL CAL: 10 mg/dL (ref 5–40)

## 2017-12-24 LAB — MAGNESIUM: Magnesium: 1.6 mg/dL (ref 1.6–2.3)

## 2018-01-07 MED FILL — $LANTUS SOLOSTAR 100 UNITS/: 100 | 60 days supply | Qty: 12 | Fill #5

## 2018-01-07 MED FILL — TRUEPLUS PEN NDL 32GX5/32: 32G X 4 MM | 30 days supply | Qty: 100 | Fill #2

## 2018-01-07 MED FILL — ATORVASTATIN 20 MG TABLET: 20 | 60 days supply | Qty: 60 | Fill #4

## 2018-01-07 MED FILL — TRUEPLUS PEN NDL 32GX5/32": 32G X 4 MM | 30 days supply | Qty: 100 | Fill #2

## 2018-03-24 ENCOUNTER — Encounter: Payer: Self-pay | Admitting: Family Medicine

## 2018-03-24 ENCOUNTER — Ambulatory Visit: Payer: Self-pay | Attending: Family Medicine | Admitting: Family Medicine

## 2018-03-24 VITALS — BP 102/71 | HR 75 | Temp 98.1°F | Wt 187.6 lb

## 2018-03-24 DIAGNOSIS — E669 Obesity, unspecified: Secondary | ICD-10-CM | POA: Insufficient documentation

## 2018-03-24 DIAGNOSIS — Z6835 Body mass index (BMI) 35.0-35.9, adult: Secondary | ICD-10-CM | POA: Insufficient documentation

## 2018-03-24 DIAGNOSIS — Z79899 Other long term (current) drug therapy: Secondary | ICD-10-CM | POA: Insufficient documentation

## 2018-03-24 DIAGNOSIS — E785 Hyperlipidemia, unspecified: Secondary | ICD-10-CM | POA: Insufficient documentation

## 2018-03-24 DIAGNOSIS — Z794 Long term (current) use of insulin: Secondary | ICD-10-CM | POA: Insufficient documentation

## 2018-03-24 DIAGNOSIS — E119 Type 2 diabetes mellitus without complications: Secondary | ICD-10-CM | POA: Insufficient documentation

## 2018-03-24 DIAGNOSIS — R21 Rash and other nonspecific skin eruption: Secondary | ICD-10-CM | POA: Insufficient documentation

## 2018-03-24 LAB — POCT GLYCOSYLATED HEMOGLOBIN (HGB A1C): HbA1c, POC (controlled diabetic range): 6.7 % (ref 0.0–7.0)

## 2018-03-24 LAB — GLUCOSE, POCT (MANUAL RESULT ENTRY): POC Glucose: 134 mg/dl — AB (ref 70–99)

## 2018-03-24 MED ORDER — INSULIN GLARGINE 100 UNIT/ML SOLOSTAR PEN: 20.0000 [IU] | PEN_INJECTOR | Freq: Every day | SUBCUTANEOUS | 6 refills | Status: DC

## 2018-03-24 MED ORDER — ATORVASTATIN CALCIUM 20 MG PO TABS: ORAL_TABLET | ORAL | 6 refills | Status: DC

## 2018-03-24 MED ORDER — CLOTRIMAZOLE-BETAMETHASONE 1-0.05 % EX CREA: 1.0000 "application " | TOPICAL_CREAM | Freq: Two times a day (BID) | CUTANEOUS | 1 refills | Status: DC

## 2018-03-24 MED FILL — CLOTRIMAZOLE-BETAMETHASONE: 1-0.05 | 7 days supply | Qty: 30 | Fill #0

## 2018-03-24 MED FILL — !LANTUS SOLOSTAR 100UNITS/M: 100 | 60 days supply | Qty: 12 | Fill #6

## 2018-03-24 NOTE — Patient Instructions (Signed)

## 2018-03-24 NOTE — Progress Notes (Signed)
Subjective:  Patient ID: Krista Campbell, female    DOB: [birth year]  Age: 47 y.o. MRN: [id number]  CC: Diabetes   HPI Krista Campbell  is a  47 year old female with history of type 2 diabetes mellitus (A1c 6.7), hyperlipidemia, obesity who presents today for a follow-up visit. She has been compliant with her insulin and denies hypoglycemia or numbness in extremities and has also been doing a lot of walking lately.  She denies visual symptoms but is not up-to-date on annual eye exam. She developed a rash in both hands which initially was pruritic but then progressed to peeling of the skin.  Also noticed similar symptoms in her right foot and attributed initially to change in weather and environmental she had traveled to [place] but symptoms have persisted after her return back to the [place]. She has no similar lesions in other body parts, denies fevers, allergies to soaps or creams.  Past Medical History:  Diagnosis Date  . Diabetes mellitus without complication ([place])   . Hyperlipidemia     History reviewed. No pertinent surgical history.  No Known Allergies   Outpatient Medications Prior to Visit  Medication Sig Dispense Refill  . Blood Glucose Monitoring Suppl (TRUE METRIX AIR GLUCOSE METER) DEVI 1 Device by Does not apply route 4 (four) times daily - after meals and at bedtime. 1 Device 0  . glucose blood test strip Use as instructed 100 each 12  . Insulin Pen Needle (RELION PEN NEEDLES) 29G X 12MM MISC For BID doses 100 each 0  . Insulin Pen Needle (TRUEPLUS PEN NEEDLES) 32G X 4 MM MISC Use as directed 100 each 12  . Insulin Syringe-Needle U-100 (BD INSULIN SYRINGE ULTRAFINE) 31G X 15/64" 0.5 ML MISC USE AS DIRECTED 100 each 12  . Lancets (FREESTYLE) lancets Use as instructed 100 each 12  . Magnesium 400 MG TABS Take 400 mg by mouth 2 (two) times daily. (Patient not taking: Reported on 06/25/2017) 60 tablet 2  . Magnesium 500 MG TABS Take by mouth.    . metroNIDAZOLE (METROGEL  VAGINAL) 0.75 % vaginal gel Place 1 Applicatorful vaginally at bedtime. (Patient not taking: Reported on 12/17/2017) 70 g 0  . atorvastatin (LIPITOR) 20 MG tablet TAKE 1 TABLET BY MOUTH DAILY AT 6 PM. 90 tablet 1  . Insulin Glargine (LANTUS SOLOSTAR) 100 UNIT/ML Solostar Pen Inject 20 Units into the skin daily at 10 pm. 15 mL 3   No facility-administered medications prior to visit.     ROS Review of Systems  Constitutional: Negative for activity change, appetite change and fatigue.  HENT: Negative for congestion, sinus pressure and sore throat.   Eyes: Negative for visual disturbance.  Respiratory: Negative for cough, chest tightness, shortness of breath and wheezing.   Cardiovascular: Negative for chest pain and palpitations.  Gastrointestinal: Negative for abdominal distention, abdominal pain and constipation.  Endocrine: Negative for polydipsia.  Genitourinary: Negative for dysuria and frequency.  Musculoskeletal: Negative for arthralgias and back pain.  Skin: Positive for rash.  Neurological: Negative for tremors, light-headedness and numbness.  Hematological: Does not bruise/bleed easily.  Psychiatric/Behavioral: Negative for agitation and behavioral problems.    Objective:  BP 102/71   Pulse 75   Temp 98.1 F (36.7 C) (Oral)   Wt 187 lb 9.6 oz (85.1 kg)   SpO2 99%   BMI 35.45 kg/m   BP/Weight 03/24/2018 08/21/6220 04/20/9891  Systolic BP 119 417 408  Diastolic BP 71 76 76  Wt. (Lbs) 187.6 206.8 192  BMI 35.45 39.07 36.28     Physical Exam  Constitutional: She is oriented to person, place, and time. She appears well-developed and well-nourished.  Cardiovascular: Normal rate, normal heart sounds and intact distal pulses.  No murmur heard. Pulmonary/Chest: Effort normal and breath sounds normal. She has no wheezes. She has no rales. She exhibits no tenderness.  Abdominal: Soft. Bowel sounds are normal. She exhibits no distension and no mass. There is no tenderness.    Musculoskeletal: Normal range of motion.  Neurological: She is alert and oriented to person, place, and time.  Skin: Skin is warm and dry.  Rash with excoriation on the base of bilateral palm and dorsum of left foot.  No nodules palpable in palm or sole.  Psychiatric: She has a normal mood and affect.    CMP Latest Ref Rng & Units 12/23/2017 09/19/2017 03/18/2017  Glucose 65 - 99 mg/dL 115(H) 89 106(H)  BUN 6 - 24 mg/dL 12 9 14   Creatinine 0.57 - 1.00 mg/dL 0.99 0.86 0.87  Sodium 134 - 144 mmol/L 140 141 142  Potassium 3.5 - 5.2 mmol/L 3.8 4.0 4.4  Chloride 96 - 106 mmol/L 99 101 101  CO2 20 - 29 mmol/L 24 26 26   Calcium 8.7 - 10.2 mg/dL 9.6 9.1 9.7  Total Protein 6.0 - 8.5 g/dL 7.4 7.5 7.3  Total Bilirubin 0.0 - 1.2 mg/dL 0.7 0.5 0.5  Alkaline Phos 39 - 117 IU/L 88 104 86  AST 0 - 40 IU/L 23 41(H) 25  ALT 0 - 32 IU/L 17 39(H) 17    Lipid Panel     Component Value Date/Time   CHOL 159 12/23/2017 0929   TRIG 52 12/23/2017 0929   HDL 62 12/23/2017 0929   CHOLHDL 2.6 12/23/2017 0929   CHOLHDL 2.2 10/19/2015 1001   VLDL 9 10/19/2015 1001   LDLCALC 87 12/23/2017 0929    Lab Results  Component Value Date   HGBA1C 6.7 03/24/2018    Assessment & Plan:   1. Type 2 diabetes mellitus without complication, with long-term current use of insulin (HCC) Controlled with A1c of 6.7 Discussed community resources for annual eye exam given lack of medical coverage Counseled on Diabetic diet, my plate method, 664 minutes of moderate intensity exercise/week Keep blood sugar logs with fasting goals of 80-120 mg/dl, random of less than 180 and in the event of sugars less than 60 mg/dl or greater than 400 mg/dl please notify the clinic ASAP. It is recommended that you undergo annual eye exams and annual foot exams. Pneumonia vaccine is recommended. - POCT glucose (manual entry) - POCT glycosylated hemoglobin (Hb A1C) - Insulin Glargine (LANTUS SOLOSTAR) 100 UNIT/ML Solostar Pen; Inject 20 Units  into the skin daily at 10 pm.  Dispense: 15 mL; Refill: 6  2. Hyperlipidemia, unspecified hyperlipidemia type Controlled Low-cholesterol diet - atorvastatin (LIPITOR) 20 MG tablet; TAKE 1 TABLET BY MOUTH DAILY AT 6 PM.  Dispense: 30 tablet; Refill: 6  3. Rash Suspicious for fungal rash We will treat with clotrimazole-betamethasone  4. Obesity (BMI 35.0-39.9 without comorbidity) Discussed reducing portion sizes, increasing physical activity   Meds ordered this encounter  Medications  . clotrimazole-betamethasone (LOTRISONE) cream    Sig: Apply 1 application topically 2 (two) times daily.    Dispense:  30 g    Refill:  1  . Insulin Glargine (LANTUS SOLOSTAR) 100 UNIT/ML Solostar Pen    Sig: Inject 20 Units into the skin daily at 10 pm.    Dispense:  15 mL    Refill:  6  . atorvastatin (LIPITOR) 20 MG tablet    Sig: TAKE 1 TABLET BY MOUTH DAILY AT 6 PM.    Dispense:  30 tablet    Refill:  6    Follow-up: Return in about 6 months (around 09/24/2018) for follow up of Diabetes.   Charlott Rakes MD

## 2018-07-14 ENCOUNTER — Other Ambulatory Visit: Payer: Self-pay | Admitting: Family Medicine

## 2018-07-14 MED FILL — TRUEPLUS PEN NDL 32GX5/32: 32G X 4 MM | 30 days supply | Qty: 100 | Fill #0

## 2018-07-31 MED FILL — ATORVASTATIN 20 MG TABLET: 20 | 30 days supply | Qty: 30 | Fill #0

## 2018-08-18 MED FILL — !LANTUS SOLOSTAR 100UNITS/M: 100 | 30 days supply | Qty: 6 | Fill #0

## 2018-09-24 ENCOUNTER — Ambulatory Visit: Payer: Self-pay | Attending: Family Medicine | Admitting: Family Medicine

## 2018-09-24 ENCOUNTER — Encounter: Payer: Self-pay | Admitting: Family Medicine

## 2018-09-24 VITALS — BP 102/66 | HR 82 | Temp 97.9°F | Ht 61.0 in | Wt 179.0 lb

## 2018-09-24 DIAGNOSIS — E785 Hyperlipidemia, unspecified: Secondary | ICD-10-CM | POA: Insufficient documentation

## 2018-09-24 DIAGNOSIS — R81 Glycosuria: Secondary | ICD-10-CM

## 2018-09-24 DIAGNOSIS — E669 Obesity, unspecified: Secondary | ICD-10-CM | POA: Insufficient documentation

## 2018-09-24 DIAGNOSIS — Z79899 Other long term (current) drug therapy: Secondary | ICD-10-CM | POA: Insufficient documentation

## 2018-09-24 DIAGNOSIS — Z6833 Body mass index (BMI) 33.0-33.9, adult: Secondary | ICD-10-CM | POA: Insufficient documentation

## 2018-09-24 DIAGNOSIS — E119 Type 2 diabetes mellitus without complications: Secondary | ICD-10-CM

## 2018-09-24 DIAGNOSIS — E1165 Type 2 diabetes mellitus with hyperglycemia: Secondary | ICD-10-CM | POA: Insufficient documentation

## 2018-09-24 DIAGNOSIS — Z794 Long term (current) use of insulin: Secondary | ICD-10-CM | POA: Insufficient documentation

## 2018-09-24 LAB — POCT URINALYSIS DIP (CLINITEK)
Bilirubin, UA: NEGATIVE
Blood, UA: NEGATIVE
Glucose, UA: 500 mg/dL — AB
Nitrite, UA: NEGATIVE
POC PROTEIN,UA: NEGATIVE
Spec Grav, UA: <=1.005 — AB (ref 1.010–1.025)
UROBILINOGEN UA: 1 U/dL
pH, UA: 5.5 (ref 5.0–8.0)

## 2018-09-24 LAB — POCT GLYCOSYLATED HEMOGLOBIN (HGB A1C): HbA1c, POC (controlled diabetic range): 11.7 % — AB (ref 0.0–7.0)

## 2018-09-24 LAB — GLUCOSE, POCT (MANUAL RESULT ENTRY)
POC GLUCOSE: 408 mg/dL — AB (ref 70–99)
POC Glucose: 348 mg/dl — AB (ref 70–99)

## 2018-09-24 MED ORDER — ATORVASTATIN CALCIUM 20 MG PO TABS: ORAL_TABLET | ORAL | 6 refills | Status: DC

## 2018-09-24 MED ORDER — INSULIN GLARGINE 100 UNIT/ML SOLOSTAR PEN: 30.0000 [IU] | PEN_INJECTOR | Freq: Every day | SUBCUTANEOUS | 6 refills | Status: DC

## 2018-09-24 MED ORDER — INSULIN ASPART 100 UNIT/ML ~~LOC~~ SOLN
20.0000 [IU] | Freq: Once | SUBCUTANEOUS | Status: AC
  Administered 2018-09-24: 20 [IU] via SUBCUTANEOUS

## 2018-09-24 NOTE — Patient Instructions (Signed)
Diabetes Mellitus and Nutrition, Adult  When you have diabetes (diabetes mellitus), it is very important to have healthy eating habits because your blood sugar (glucose) levels are greatly affected by what you eat and drink. Eating healthy foods in the appropriate amounts, at about the same times every day, can help you:  · Control your blood glucose.  · Lower your risk of heart disease.  · Improve your blood pressure.  · Reach or maintain a healthy weight.  Every person with diabetes is different, and each person has different needs for a meal plan. Your health care provider may recommend that you work with a diet and nutrition specialist (dietitian) to make a meal plan that is best for you. Your meal plan may vary depending on factors such as:  · The calories you need.  · The medicines you take.  · Your weight.  · Your blood glucose, blood pressure, and cholesterol levels.  · Your activity level.  · Other health conditions you have, such as heart or kidney disease.  How do carbohydrates affect me?  Carbohydrates, also called carbs, affect your blood glucose level more than any other type of food. Eating carbs naturally raises the amount of glucose in your blood. Carb counting is a method for keeping track of how many carbs you eat. Counting carbs is important to keep your blood glucose at a healthy level, especially if you use insulin or take certain oral diabetes medicines.  It is important to know how many carbs you can safely have in each meal. This is different for every person. Your dietitian can help you calculate how many carbs you should have at each meal and for each snack.  Foods that contain carbs include:  · Bread, cereal, rice, pasta, and crackers.  · Potatoes and corn.  · Peas, beans, and lentils.  · Milk and yogurt.  · Fruit and juice.  · Desserts, such as cakes, cookies, ice cream, and candy.  How does alcohol affect me?  Alcohol can cause a sudden decrease in blood glucose (hypoglycemia),  especially if you use insulin or take certain oral diabetes medicines. Hypoglycemia can be a life-threatening condition. Symptoms of hypoglycemia (sleepiness, dizziness, and confusion) are similar to symptoms of having too much alcohol.  If your health care provider says that alcohol is safe for you, follow these guidelines:  · Limit alcohol intake to no more than 1 drink per day for nonpregnant women and 2 drinks per day for men. One drink equals 12 oz of beer, 5 oz of wine, or 1½ oz of hard liquor.  · Do not drink on an empty stomach.  · Keep yourself hydrated with water, diet soda, or unsweetened iced tea.  · Keep in mind that regular soda, juice, and other mixers may contain a lot of sugar and must be counted as carbs.  What are tips for following this plan?    Reading food labels  · Start by checking the serving size on the "Nutrition Facts" label of packaged foods and drinks. The amount of calories, carbs, fats, and other nutrients listed on the label is based on one serving of the item. Many items contain more than one serving per package.  · Check the total grams (g) of carbs in one serving. You can calculate the number of servings of carbs in one serving by dividing the total carbs by 15. For example, if a food has 30 g of total carbs, it would be equal to 2   servings of carbs.  · Check the number of grams (g) of saturated and trans fats in one serving. Choose foods that have low or no amount of these fats.  · Check the number of milligrams (mg) of salt (sodium) in one serving. Most people should limit total sodium intake to less than 2,300 mg per day.  · Always check the nutrition information of foods labeled as "low-fat" or "nonfat". These foods may be higher in added sugar or refined carbs and should be avoided.  · Talk to your dietitian to identify your daily goals for nutrients listed on the label.  Shopping  · Avoid buying canned, premade, or processed foods. These foods tend to be high in fat, sodium,  and added sugar.  · Shop around the outside edge of the grocery store. This includes fresh fruits and vegetables, bulk grains, fresh meats, and fresh dairy.  Cooking  · Use low-heat cooking methods, such as baking, instead of high-heat cooking methods like deep frying.  · Cook using healthy oils, such as olive, canola, or sunflower oil.  · Avoid cooking with butter, cream, or high-fat meats.  Meal planning  · Eat meals and snacks regularly, preferably at the same times every day. Avoid going long periods of time without eating.  · Eat foods high in fiber, such as fresh fruits, vegetables, beans, and whole grains. Talk to your dietitian about how many servings of carbs you can eat at each meal.  · Eat 4-6 ounces (oz) of lean protein each day, such as lean meat, chicken, fish, eggs, or tofu. One oz of lean protein is equal to:  ? 1 oz of meat, chicken, or fish.  ? 1 egg.  ? ¼ cup of tofu.  · Eat some foods each day that contain healthy fats, such as avocado, nuts, seeds, and fish.  Lifestyle  · Check your blood glucose regularly.  · Exercise regularly as told by your health care provider. This may include:  ? 150 minutes of moderate-intensity or vigorous-intensity exercise each week. This could be brisk walking, biking, or water aerobics.  ? Stretching and doing strength exercises, such as yoga or weightlifting, at least 2 times a week.  · Take medicines as told by your health care provider.  · Do not use any products that contain nicotine or tobacco, such as cigarettes and e-cigarettes. If you need help quitting, ask your health care provider.  · Work with a counselor or diabetes educator to identify strategies to manage stress and any emotional and social challenges.  Questions to ask a health care provider  · Do I need to meet with a diabetes educator?  · Do I need to meet with a dietitian?  · What number can I call if I have questions?  · When are the best times to check my blood glucose?  Where to find more  information:  · American Diabetes Association: diabetes.org  · Academy of Nutrition and Dietetics: www.eatright.org  · National Institute of Diabetes and Digestive and Kidney Diseases (NIH): www.niddk.nih.gov  Summary  · A healthy meal plan will help you control your blood glucose and maintain a healthy lifestyle.  · Working with a diet and nutrition specialist (dietitian) can help you make a meal plan that is best for you.  · Keep in mind that carbohydrates (carbs) and alcohol have immediate effects on your blood glucose levels. It is important to count carbs and to use alcohol carefully.  This information is not intended to   replace advice given to you by your health care provider. Make sure you discuss any questions you have with your health care provider.  Document Released: 04/26/2005 Document Revised: 02/27/2017 Document Reviewed: 09/03/2016  [employer]vier Interactive Patient Education © 2019 [employer]

## 2018-09-24 NOTE — Progress Notes (Signed)
Subjective:  Patient ID: Krista Campbell, female    DOB: 04-25-1971  Age: 48 y.o. MRN: [id number]  CC: Diabetes   HPI Krista Campbell is a  48 year old female with history of type 2 diabetes mellitus (A1c 11.7), hyperlipidemia, obesity who presents today for a follow-up visit. Her A1c is 11.7 which is up from 6.7 previously and she endorses compliance with her medications and has not changed her eating habits.  She noted her blood sugars have been elevated at home.  CBG is 408 in the clinic, last meal was 3 hours ago; 20 units of NovoLog administered.  Denies visual concerns and is not up-to-date on her eye exams but plans to go to [facility] an [facility] exam. 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 Doing well on her statin and denies adverse effects from it.  Past Medical History:  Diagnosis Date  . Diabetes mellitus without complication ([place])   . Hyperlipidemia     History reviewed. No pertinent surgical history.  Family History  Problem Relation Age of Onset  . Kidney disease Father     No Known Allergies  Outpatient Medications Prior to Visit  Medication Sig Dispense Refill  . Blood Glucose Monitoring Suppl (TRUE METRIX AIR GLUCOSE METER) DEVI 1 Device by Does not apply route 4 (four) times daily - after meals and at bedtime. 1 Device 0  . clotrimazole-betamethasone (LOTRISONE) cream Apply 1 application topically 2 (two) times daily. (Patient not taking: Reported on 09/24/2018) 30 g 1  . glucose blood test strip Use as instructed 100 each 12  . Insulin Syringe-Needle U-100 (BD INSULIN SYRINGE ULTRAFINE) 31G X 15/64" 0.5 ML MISC USE AS DIRECTED 100 each 12  . Lancets (FREESTYLE) lancets Use as instructed 100 each 12  . Magnesium 400 MG TABS Take 400 mg by mouth 2 (two) times daily. (Patient not taking: Reported on 06/25/2017) 60 tablet 2  . Magnesium 500 MG TABS Take by mouth.     . metroNIDAZOLE (METROGEL VAGINAL) 0.75 % vaginal gel Place 1 Applicatorful vaginally at bedtime. (Patient not taking: Reported on 12/17/2017) 70 g 0  . TRUEPLUS PEN NEEDLES 32G X 4 MM MISC USE AS DIRECTED 100 each 11  . atorvastatin (LIPITOR) 20 MG tablet TAKE 1 TABLET BY MOUTH DAILY AT 6 PM. 30 tablet 6  . Insulin Glargine (LANTUS SOLOSTAR) 100 UNIT/ML Solostar Pen Inject 20 Units into the skin daily at 10 pm. 15 mL 6   No facility-administered medications prior to visit.      ROS Review of Systems General: negative for fever, weight loss, appetite change Eyes: no visual symptoms. ENT: no ear symptoms, no sinus tenderness, no nasal congestion or sore throat. Neck: no pain  Respiratory: no wheezing, shortness of breath, cough Cardiovascular: no chest pain, no dyspnea on exertion, no pedal edema, no orthopnea. Gastrointestinal: no abdominal pain, no diarrhea, no constipation Genito-Urinary: no urinary frequency, no dysuria, no polyuria. Hematologic: no bruising Endocrine: no cold or heat intolerance Neurological: no headaches, no seizures, no tremors, + neuropathy Musculoskeletal: no joint pains, no joint swelling Skin: no pruritus, no rash. Psychological: no depression, no anxiety,    Objective:  BP 102/66   Pulse 82   Temp 97.9 F (36.6 C) (Oral)   Ht 5\' 1"  (1.549 m)   Wt 179 lb (81.2 kg)   SpO2 98%   BMI 33.82 kg/m   BP/Weight 09/24/2018 03/24/2018  01/20/6788  Systolic BP 381 017 510  Diastolic BP 66 71 76  Wt. (Lbs) 179 187.6 206.8  BMI 33.82 35.45 39.07      Physical Exam Constitutional: normal appearing,  Eyes: PERRLA HEENT: Head is atraumatic, normal sinuses, normal oropharynx, normal appearing tonsils and palate, tympanic membrane is normal bilaterally. Neck: normal range of motion, no thyromegaly, no JVD Cardiovascular: normal rate and rhythm, normal heart sounds, no murmurs, rub or gallop, no pedal edema Respiratory: Normal breath sounds, clear to  auscultation bilaterally, no wheezes, no rales, no rhonchi Abdomen: soft, not tender to palpation, normal bowel sounds, no enlarged organs Musculoskeletal: Full ROM, no tenderness in joints Skin: warm and dry, no lesions. Neurological: alert, oriented x3, cranial nerves I-XII grossly intact , normal motor strength, normal sensation. Psychological: normal mood.   CMP Latest Ref Rng & Units 12/23/2017 09/19/2017 03/18/2017  Glucose 65 - 99 mg/dL 115(H) 89 106(H)  BUN 6 - 24 mg/dL 12 9 14   Creatinine 0.57 - 1.00 mg/dL 0.99 0.86 0.87  Sodium 134 - 144 mmol/L 140 141 142  Potassium 3.5 - 5.2 mmol/L 3.8 4.0 4.4  Chloride 96 - 106 mmol/L 99 101 101  CO2 20 - 29 mmol/L 24 26 26   Calcium 8.7 - 10.2 mg/dL 9.6 9.1 9.7  Total Protein 6.0 - 8.5 g/dL 7.4 7.5 7.3  Total Bilirubin 0.0 - 1.2 mg/dL 0.7 0.5 0.5  Alkaline Phos 39 - 117 IU/L 88 104 86  AST 0 - 40 IU/L 23 41(H) 25  ALT 0 - 32 IU/L 17 39(H) 17    Lipid Panel     Component Value Date/Time   CHOL 159 12/23/2017 0929   TRIG 52 12/23/2017 0929   HDL 62 12/23/2017 0929   CHOLHDL 2.6 12/23/2017 0929   CHOLHDL 2.2 10/19/2015 1001   VLDL 9 10/19/2015 1001   LDLCALC 87 12/23/2017 0929    CBC    Component Value Date/Time   WBC 4.9 10/19/2015 1001   RBC 4.17 10/19/2015 1001   HGB 11.8 (L) 10/19/2015 1001   HCT 36.7 10/19/2015 1001   PLT 320 10/19/2015 1001   MCV 88.0 10/19/2015 1001   MCH 28.3 10/19/2015 1001   MCHC 32.2 10/19/2015 1001   RDW 14.2 10/19/2015 1001   LYMPHSABS 2.3 05/19/2014 1155   MONOABS 0.7 05/19/2014 1155   EOSABS 0.0 05/19/2014 1155   BASOSABS 0.0 05/19/2014 1155    Lab Results  Component Value Date   HGBA1C 11.7 (A) 09/24/2018    Assessment & Plan:   1. Type 2 diabetes mellitus without complication, with long-term current use of insulin (HCC) Uncontrolled with A1c of 11.7 which has trended up from 6.7 previously Increase Lantus dose and advised to uptitrate by 2 units every fourth day until blood sugars  are at goal 20 units of NovoLog administered due to hyperglycemia of 408 and blood sugar repeated after 30 minutes was 348 He does have evidence of neuropathy however declines initiation of gabapentin and would rather use magnesium Counseled on Diabetic diet, my plate method, 258 minutes of moderate intensity exercise/week Keep blood sugar logs with fasting goals of 80-120 mg/dl, random of less than 180 and in the event of sugars less than 60 mg/dl or greater than 400 mg/dl please notify the clinic ASAP. It is recommended that you undergo annual eye exams and annual foot exams. Pneumonia vaccine is recommended. - POCT glycosylated hemoglobin (Hb A1C) - POCT glucose (manual entry) - Insulin Glargine (LANTUS SOLOSTAR) 100 UNIT/ML Solostar  Pen; Inject 30 Units into the skin daily at 10 pm.  Dispense: 30 mL; Refill: 6 - Microalbumin / creatinine urine ratio - Basic Metabolic Panel - Magnesium - insulin aspart (novoLOG) injection 20 Units - POCT glucose (manual entry) - POCT URINALYSIS DIP (CLINITEK)  2. Hyperlipidemia, unspecified hyperlipidemia type Controlled Low-cholesterol diet. - atorvastatin (LIPITOR) 20 MG tablet; TAKE 1 TABLET BY MOUTH DAILY AT 6 PM.  Dispense: 30 tablet; Refill: 6   Meds ordered this encounter  Medications  . Insulin Glargine (LANTUS SOLOSTAR) 100 UNIT/ML Solostar Pen    Sig: Inject 30 Units into the skin daily at 10 pm.    Dispense:  30 mL    Refill:  6  . atorvastatin (LIPITOR) 20 MG tablet    Sig: TAKE 1 TABLET BY MOUTH DAILY AT 6 PM.    Dispense:  30 tablet    Refill:  6  . insulin aspart (novoLOG) injection 20 Units    Follow-up: Return in about 3 months (around 12/23/2018) for Follow-up on diabetes mellitus.       Charlott Rakes, MD, FAAFP. [facility] and [place], [place]   09/24/2018, 5:14 PM

## 2018-09-25 ENCOUNTER — Other Ambulatory Visit: Payer: Self-pay | Admitting: Family Medicine

## 2018-09-25 LAB — BASIC METABOLIC PANEL
BUN/Creatinine Ratio: 11 (ref 9–23)
BUN: 11 mg/dL (ref 6–24)
CO2: 23 mmol/L (ref 20–29)
Calcium: 9.1 mg/dL (ref 8.7–10.2)
Chloride: 98 mmol/L (ref 96–106)
Creatinine, Ser: 0.98 mg/dL (ref 0.57–1.00)
GFR calc Af Amer: 79 mL/min/{1.73_m2} (ref >59)
GFR calc non Af Amer: 69 mL/min/{1.73_m2} (ref >59)
Glucose: 391 mg/dL — ABNORMAL HIGH (ref 65–99)
Potassium: 4 mmol/L (ref 3.5–5.2)
Sodium: 136 mmol/L (ref 134–144)

## 2018-09-25 LAB — MICROALBUMIN / CREATININE URINE RATIO
CREATININE, UR: 39.3 mg/dL
Microalb/Creat Ratio: <8 mg/g creat (ref 0–29)
Microalbumin, Urine: <3 ug/mL

## 2018-09-25 LAB — MAGNESIUM: Magnesium: 1.2 mg/dL — ABNORMAL LOW (ref 1.6–2.3)

## 2018-09-25 MED ORDER — MAGNESIUM 400 MG PO TABS: 400.0000 mg | ORAL_TABLET | Freq: Two times a day (BID) | ORAL | 2 refills | Status: DC

## 2018-09-25 MED FILL — $LANTUS SOLOSTAR 100 UNITS/: 100 | 50 days supply | Qty: 15 | Fill #0

## 2018-09-25 MED FILL — ATORVASTATIN 20 MG TABLET: 20 | 30 days supply | Qty: 30 | Fill #0

## 2018-11-03 MED FILL — ATORVASTATIN 20 MG TABLET: 20 | 30 days supply | Qty: 30 | Fill #1

## 2018-11-03 MED FILL — $LANTUS SOLOSTAR 100 UNITS/: 100 | 50 days supply | Qty: 15 | Fill #1

## 2018-11-10 MED FILL — TRUEPLUS PEN NDL 32GX5/32": 32G X 4 MM | 30 days supply | Qty: 100 | Fill #1

## 2018-11-10 MED FILL — TRUEPLUS PEN NDL 32GX5/32: 32G X 4 MM | 30 days supply | Qty: 100 | Fill #1

## 2018-12-23 ENCOUNTER — Ambulatory Visit: Payer: Self-pay | Admitting: Family Medicine

## 2019-01-06 ENCOUNTER — Other Ambulatory Visit: Payer: Self-pay

## 2019-01-06 ENCOUNTER — Ambulatory Visit: Payer: Self-pay | Attending: Family Medicine | Admitting: Family Medicine

## 2019-01-06 ENCOUNTER — Encounter: Payer: Self-pay | Admitting: Family Medicine

## 2019-01-06 DIAGNOSIS — E119 Type 2 diabetes mellitus without complications: Secondary | ICD-10-CM

## 2019-01-06 DIAGNOSIS — E785 Hyperlipidemia, unspecified: Secondary | ICD-10-CM

## 2019-01-06 DIAGNOSIS — Z794 Long term (current) use of insulin: Secondary | ICD-10-CM

## 2019-01-06 MED ORDER — INSULIN GLARGINE 100 UNIT/ML SOLOSTAR PEN: 30.0000 [IU] | PEN_INJECTOR | Freq: Every day | SUBCUTANEOUS | 6 refills | Status: DC

## 2019-01-06 MED ORDER — ATORVASTATIN CALCIUM 20 MG PO TABS: ORAL_TABLET | ORAL | 6 refills | Status: DC

## 2019-01-06 MED FILL — ?ATORVASTATIN 20 MG TABLET: 20 | 90 days supply | Qty: 90 | Fill #0

## 2019-01-06 MED FILL — $LANTUS SOLOSTAR 100 UNITS/: 100 | 50 days supply | Qty: 15 | Fill #0

## 2019-01-06 NOTE — Progress Notes (Signed)
Virtual Visit via Telephone Note  I connected with Krista Campbell, on 01/06/2019 at 8:40 AM by telephone due to the COVID-19 pandemic and verified that I am speaking with the correct person using two identifiers.   Consent: I discussed the limitations, risks, security and privacy concerns of performing an evaluation and management service by telephone and the availability of in person appointments. I also discussed with the patient that there may be a patient responsible charge related to this service. The patient expressed understanding and agreed to proceed.   Location of Patient: Patient's home  Location of Provider: Clinic   Persons participating in Telemedicine visit: Nao Linz Farrington-CMA Dr. Felecia Shelling    History of Present Illness: Krista Campbell is a  48 year old female with history of type 2 diabetes mellitus (A1c 11.7), hyperlipidemia, obesity who presents today for a follow-up visit. Her fasting sugars have ranged around 110 and she endorses compliance with her insulin taking 30 units of Lantus at night.  Her only hypoglycemic episode was a blood sugar of 85 at midday after she had skipped her breakfast and had noticed some tremors but other than that she has been good.  Denies numbness in extremities, blurry vision. She is compliant with her statin on a low-cholesterol diet. She has been home due to ongoing for 10-week and complains of feeling bored. Denies chest pain, shortness of breath and has no additional concerns today.   Past Medical History:  Diagnosis Date  . Diabetes mellitus without complication ([place])   . Hyperlipidemia    No Known Allergies  Current Outpatient Medications on File Prior to Visit  Medication Sig Dispense Refill  . atorvastatin (LIPITOR) 20 MG tablet TAKE 1 TABLET BY MOUTH DAILY AT 6 PM. 30 tablet 6  . Blood Glucose Monitoring Suppl (TRUE METRIX AIR GLUCOSE METER) DEVI 1 Device by Does not apply route 4 (four) times  daily - after meals and at bedtime. 1 Device 0  . glucose blood test strip Use as instructed 100 each 12  . Insulin Glargine (LANTUS SOLOSTAR) 100 UNIT/ML Solostar Pen Inject 30 Units into the skin daily at 10 pm. 30 mL 6  . Insulin Syringe-Needle U-100 (BD INSULIN SYRINGE ULTRAFINE) 31G X 15/64" 0.5 ML MISC USE AS DIRECTED 100 each 12  . Lancets (FREESTYLE) lancets Use as instructed 100 each 12  . Magnesium 400 MG TABS Take 400 mg by mouth 2 (two) times daily. 60 tablet 2  . TRUEPLUS PEN NEEDLES 32G X 4 MM MISC USE AS DIRECTED 100 each 11  . clotrimazole-betamethasone (LOTRISONE) cream Apply 1 application topically 2 (two) times daily. (Patient not taking: Reported on 09/24/2018) 30 g 1  . Magnesium 500 MG TABS Take by mouth.    . metroNIDAZOLE (METROGEL VAGINAL) 0.75 % vaginal gel Place 1 Applicatorful vaginally at bedtime. (Patient not taking: Reported on 12/17/2017) 70 g 0   No current facility-administered medications on file prior to visit.     Observations/Objective: Awake, alert, oriented x3 Not in acute distress  CMP Latest Ref Rng & Units 09/24/2018 12/23/2017 09/19/2017  Glucose 65 - 99 mg/dL 391(H) 115(H) 89  BUN 6 - 24 mg/dL 11 12 9   Creatinine 0.57 - 1.00 mg/dL 0.98 0.99 0.86  Sodium 134 - 144 mmol/L 136 140 141  Potassium 3.5 - 5.2 mmol/L 4.0 3.8 4.0  Chloride 96 - 106 mmol/L 98 99 101  CO2 20 - 29 mmol/L 23 24 26   Calcium 8.7 - 10.2 mg/dL 9.1 9.6  9.1  Total Protein 6.0 - 8.5 g/dL - 7.4 7.5  Total Bilirubin 0.0 - 1.2 mg/dL - 0.7 0.5  Alkaline Phos 39 - 117 IU/L - 88 104  AST 0 - 40 IU/L - 23 41(H)  ALT 0 - 32 IU/L - 17 39(H)    Lipid Panel     Component Value Date/Time   CHOL 159 12/23/2017 0929   TRIG 52 12/23/2017 0929   HDL 62 12/23/2017 0929   CHOLHDL 2.6 12/23/2017 0929   CHOLHDL 2.2 10/19/2015 1001   VLDL 9 10/19/2015 1001   LDLCALC 87 12/23/2017 0929    Lab Results  Component Value Date   HGBA1C 11.7 (A) 09/24/2018    Assessment and Plan: 1.  Hyperlipidemia, unspecified hyperlipidemia type Controlled Low-cholesterol diet - atorvastatin (LIPITOR) 20 MG tablet; TAKE 1 TABLET BY MOUTH DAILY AT 6 PM.  Dispense: 30 tablet; Refill: 6  2. Type 2 diabetes mellitus without complication, with long-term current use of insulin (HCC) Uncontrolled with A1c of 11.7 Blood sugar log reveals improvement No regimen change today A1c will be due at next visit - Insulin Glargine (LANTUS SOLOSTAR) 100 UNIT/ML Solostar Pen; Inject 30 Units into the skin daily at 10 pm.  Dispense: 30 mL; Refill: 6   Follow Up Instructions: Return in about 3 months (around 04/08/2019).    I discussed the assessment and treatment plan with the patient. The patient was provided an opportunity to ask questions and all were answered. The patient agreed with the plan and demonstrated an understanding of the instructions.   The patient was advised to call back or seek an in-person evaluation if the symptoms worsen or if the condition fails to improve as anticipated.     I provided 11 minutes total of non-face-to-face time during this encounter including median intraservice time, reviewing previous notes, labs, imaging, medications and explaining diagnosis and management.     Charlott Rakes, MD, FAAFP. [facility] and [place], [place]   01/06/2019, 8:40 AM

## 2019-01-06 NOTE — Progress Notes (Signed)
Patient has been called and DOB has been verified. Patient has been screened and transferred to PCP to start phone visit.     

## 2019-01-13 ENCOUNTER — Ambulatory Visit: Payer: Self-pay | Admitting: Family Medicine

## 2019-03-16 MED FILL — !LANTUS SOLOSTAR 100UNITS/M: 100 | 30 days supply | Qty: 9 | Fill #1

## 2019-04-27 MED FILL — !LANTUS SOLOSTAR 100UNITS/M: 100 | 30 days supply | Qty: 9 | Fill #2

## 2019-05-06 MED FILL — TRUEPLUS PEN NDL 32GX5/32": 32G X 4 MM | 30 days supply | Qty: 100 | Fill #2

## 2019-05-06 MED FILL — TRUEPLUS PEN NDL 32GX5/32: 32G X 4 MM | 30 days supply | Qty: 100 | Fill #2

## 2019-06-11 MED FILL — ATORVASTATIN CALCIUM 20 MG: 20 | 30 days supply | Qty: 30 | Fill #1

## 2019-06-11 MED FILL — LANTUS SOLOSTAR 100 UNITS/M: 100 | 30 days supply | Qty: 9 | Fill #3

## 2019-07-20 ENCOUNTER — Telehealth: Payer: Self-pay | Admitting: Family Medicine

## 2019-07-20 ENCOUNTER — Other Ambulatory Visit: Payer: Self-pay

## 2019-07-20 DIAGNOSIS — E119 Type 2 diabetes mellitus without complications: Secondary | ICD-10-CM

## 2019-07-20 MED ORDER — TRUEPLUS PEN NEEDLES 32G X 4 MM MISC: 11 refills | Status: DC

## 2019-07-20 MED ORDER — FREESTYLE LANCETS MISC: 12 refills | Status: DC

## 2019-07-20 MED ORDER — GLUCOSE BLOOD VI STRP: ORAL_STRIP | 12 refills | Status: DC

## 2019-07-20 NOTE — Telephone Encounter (Signed)
Refills have been sent.  

## 2019-07-20 NOTE — Telephone Encounter (Signed)
Patient called stating she now has insurance and in order for her to receive her med through the mail the company needs a new Rx for 90 day supply on the following meds  Test strips  Lancets  Pen needles   [facility]   [facility]  [place] [address]

## 2019-07-21 ENCOUNTER — Other Ambulatory Visit: Payer: Self-pay

## 2019-07-21 MED ORDER — FREESTYLE LANCETS MISC: 12 refills | Status: DC

## 2019-07-31 ENCOUNTER — Other Ambulatory Visit: Payer: Self-pay | Admitting: Pharmacist

## 2019-07-31 DIAGNOSIS — Z794 Long term (current) use of insulin: Secondary | ICD-10-CM

## 2019-07-31 DIAGNOSIS — E119 Type 2 diabetes mellitus without complications: Secondary | ICD-10-CM

## 2019-07-31 MED ORDER — BAYER CONTOUR MONITOR DEVI: 0 refills | Status: AC

## 2019-07-31 MED ORDER — BAYER MICROLET LANCETS MISC: 11 refills | Status: AC

## 2019-07-31 MED ORDER — GLUCOSE BLOOD VI STRP: ORAL_STRIP | 12 refills | Status: AC

## 2019-08-17 ENCOUNTER — Other Ambulatory Visit: Payer: Self-pay

## 2019-08-17 DIAGNOSIS — E119 Type 2 diabetes mellitus without complications: Secondary | ICD-10-CM

## 2019-08-17 DIAGNOSIS — Z794 Long term (current) use of insulin: Secondary | ICD-10-CM

## 2019-08-17 MED ORDER — LANTUS SOLOSTAR 100 UNIT/ML ~~LOC~~ SOPN: 30.0000 [IU] | PEN_INJECTOR | Freq: Every day | SUBCUTANEOUS | 6 refills | Status: DC

## 2019-08-17 MED FILL — ATORVASTATIN CALCIUM 20 MG: 20 | 30 days supply | Qty: 30 | Fill #2

## 2019-08-26 ENCOUNTER — Ambulatory Visit: Payer: BC Managed Care – PPO | Attending: Family Medicine | Admitting: Family Medicine

## 2019-08-26 ENCOUNTER — Encounter: Payer: Self-pay | Admitting: Family Medicine

## 2019-08-26 ENCOUNTER — Other Ambulatory Visit: Payer: Self-pay

## 2019-08-26 VITALS — BP 111/73 | HR 75 | Ht 61.0 in | Wt 188.0 lb

## 2019-08-26 DIAGNOSIS — E1149 Type 2 diabetes mellitus with other diabetic neurological complication: Secondary | ICD-10-CM

## 2019-08-26 DIAGNOSIS — Z794 Long term (current) use of insulin: Secondary | ICD-10-CM | POA: Diagnosis not present

## 2019-08-26 DIAGNOSIS — Z0000 Encounter for general adult medical examination without abnormal findings: Secondary | ICD-10-CM

## 2019-08-26 DIAGNOSIS — E785 Hyperlipidemia, unspecified: Secondary | ICD-10-CM

## 2019-08-26 DIAGNOSIS — R109 Unspecified abdominal pain: Secondary | ICD-10-CM

## 2019-08-26 LAB — GLUCOSE, POCT (MANUAL RESULT ENTRY): POC Glucose: 174 mg/dl — AB (ref 70–99)

## 2019-08-26 LAB — POCT GLYCOSYLATED HEMOGLOBIN (HGB A1C): HbA1c, POC (controlled diabetic range): 11.7 % — AB (ref 0.0–7.0)

## 2019-08-26 MED ORDER — LANTUS SOLOSTAR 100 UNIT/ML ~~LOC~~ SOPN: 38.0000 [IU] | PEN_INJECTOR | Freq: Every day | SUBCUTANEOUS | 6 refills | Status: DC

## 2019-08-26 MED ORDER — ATORVASTATIN CALCIUM 20 MG PO TABS: ORAL_TABLET | ORAL | 6 refills | Status: DC

## 2019-08-26 MED ORDER — GABAPENTIN 300 MG PO CAPS: 300.0000 mg | ORAL_CAPSULE | Freq: Every day | ORAL | 6 refills | Status: DC

## 2019-08-26 NOTE — Progress Notes (Signed)
Subjective:  Patient ID: Krista Campbell, female    DOB: 1971-04-20  Age: 49 y.o. MRN: [id number]  CC: Diabetes   HPI Krista Campbell is a  49 year old female with history of type 2 diabetes mellitus (A1c 11.7), hyperlipidemia, obesity who presents today for a follow-up visit.Last A1c. Her blood sugars fluctuate from 120 to 280 and highest was 345 (she had run out of insulin at the time). Her work schedule makes complying with a diabetic diet difficult. She has neuropathy in feet and had declined Gabapentin in the past. Eye exam was in 02/2019 Compliant with her insulin and is doing well on her statin. She has Left sided pain for the last few months and worse at work where she stands the whole shift and pain sometimes occurs on her right side. Her coworkers have also complained of pain on their sides. She denies presence of nausea, vomiting, diarrhea.  Past Medical History:  Diagnosis Date  . Diabetes mellitus without complication ([place])   . Hyperlipidemia     No past surgical history on file.  Family History  Problem Relation Age of Onset  . Kidney disease Father     No Known Allergies  Outpatient Medications Prior to Visit  Medication Sig Dispense Refill  . atorvastatin (LIPITOR) 20 MG tablet TAKE 1 TABLET BY MOUTH DAILY AT 6 PM. 30 tablet 6  . Bayer Microlet Lancets lancets Use to check blood sugar 3 times daily as directed. E11.9. Z79.4 100 each 11  . Blood Glucose Monitoring Suppl (CONTOUR BLOOD GLUCOSE SYSTEM) DEVI Use to check blood sugar 3 times daily as directed. E11.9. Z79.4 1 each 0  . glucose blood test strip Use to check blood sugar 3 times daily as directed. E11.9. Z79.4 100 each 12  . Insulin Glargine (LANTUS SOLOSTAR) 100 UNIT/ML Solostar Pen Inject 30 Units into the skin daily at 10 pm. 30 mL 6  . Insulin Pen Needle (TRUEPLUS PEN NEEDLES) 32G X 4 MM MISC USE AS DIRECTED 100 each 11  . Insulin Syringe-Needle U-100 (BD INSULIN SYRINGE ULTRAFINE) 31G X 15/64"  0.5 ML MISC USE AS DIRECTED 100 each 12  . clotrimazole-betamethasone (LOTRISONE) cream Apply 1 application topically 2 (two) times daily. (Patient not taking: Reported on 09/24/2018) 30 g 1  . Magnesium 400 MG TABS Take 400 mg by mouth 2 (two) times daily. (Patient not taking: Reported on 08/26/2019) 60 tablet 2  . Magnesium 500 MG TABS Take by mouth.    . metroNIDAZOLE (METROGEL VAGINAL) 0.75 % vaginal gel Place 1 Applicatorful vaginally at bedtime. (Patient not taking: Reported on 12/17/2017) 70 g 0   No facility-administered medications prior to visit.     ROS Review of Systems  Constitutional: Negative for activity change, appetite change and fatigue.  HENT: Negative for congestion, sinus pressure and sore throat.   Eyes: Negative for visual disturbance.  Respiratory: Negative for cough, chest tightness, shortness of breath and wheezing.   Cardiovascular: Negative for chest pain and palpitations.  Gastrointestinal: Negative for abdominal distention, abdominal pain and constipation.  Endocrine: Negative for polydipsia.  Genitourinary: Negative for dysuria and frequency.  Musculoskeletal: Negative for arthralgias and back pain.  Skin: Negative for rash.  Neurological: Positive for numbness. Negative for tremors and light-headedness.  Hematological: Does not bruise/bleed easily.  Psychiatric/Behavioral: Negative for agitation and behavioral problems.    Objective:  BP 111/73   Pulse 75   Ht 5\' 1"  (1.549 m)   Wt 188 lb (85.3 kg)   SpO2  99%   BMI 35.52 kg/m   BP/Weight 08/26/2019 09/24/2018 [id number]  Systolic BP [id number]  Diastolic BP 73 66 71  Wt. (Lbs) 188 179 187.6  BMI 35.52 33.82 35.45      Physical Exam Constitutional:      Appearance: She is well-developed.  Neck:     Vascular: No JVD.  Cardiovascular:     Rate and Rhythm: Normal rate.     Heart sounds: Normal heart sounds. No murmur.  Pulmonary:     Effort: Pulmonary effort is normal.     Breath sounds:  Normal breath sounds. No wheezing or rales.  Chest:     Chest wall: No tenderness.  Abdominal:     General: Bowel sounds are normal. There is no distension.     Palpations: Abdomen is soft. There is no mass.     Tenderness: There is no abdominal tenderness.  Musculoskeletal:        General: Normal range of motion.     Right lower leg: No edema.     Left lower leg: No edema.  Neurological:     Mental Status: She is alert and oriented to person, place, and time.  Psychiatric:        Mood and Affect: Mood normal.     CMP Latest Ref Rng & Units 09/24/2018 12/23/2017 09/19/2017  Glucose 65 - 99 mg/dL 391(H) 115(H) 89  BUN 6 - 24 mg/dL 11 12 9   Creatinine 0.57 - 1.00 mg/dL 0.98 0.99 0.86  Sodium 134 - 144 mmol/L 136 140 141  Potassium 3.5 - 5.2 mmol/L 4.0 3.8 4.0  Chloride 96 - 106 mmol/L 98 99 101  CO2 20 - 29 mmol/L 23 24 26   Calcium 8.7 - 10.2 mg/dL 9.1 9.6 9.1  Total Protein 6.0 - 8.5 g/dL - 7.4 7.5  Total Bilirubin 0.0 - 1.2 mg/dL - 0.7 0.5  Alkaline Phos 39 - 117 IU/L - 88 104  AST 0 - 40 IU/L - 23 41(H)  ALT 0 - 32 IU/L - 17 39(H)    Lipid Panel     Component Value Date/Time   CHOL 159 12/23/2017 0929   TRIG 52 12/23/2017 0929   HDL 62 12/23/2017 0929   CHOLHDL 2.6 12/23/2017 0929   CHOLHDL 2.2 10/19/2015 1001   VLDL 9 10/19/2015 1001   LDLCALC 87 12/23/2017 0929    CBC    Component Value Date/Time   WBC 4.9 10/19/2015 1001   RBC 4.17 10/19/2015 1001   HGB 11.8 (L) 10/19/2015 1001   HCT 36.7 10/19/2015 1001   PLT 320 10/19/2015 1001   MCV 88.0 10/19/2015 1001   MCH 28.3 10/19/2015 1001   MCHC 32.2 10/19/2015 1001   RDW 14.2 10/19/2015 1001   LYMPHSABS 2.3 05/19/2014 1155   MONOABS 0.7 05/19/2014 1155   EOSABS 0.0 05/19/2014 1155   BASOSABS 0.0 05/19/2014 1155    Lab Results  Component Value Date   HGBA1C 11.7 (A) 09/24/2018    Assessment & Plan:   1. Type 2 diabetes mellitus with other neurologic complication, with long-term current use of insulin  (HCC) Uncontrolled with A1c of 11.7 Increase Lantus from 30 to 38 units Advised on up titrating and down titrating Lantus if blood sugars are above or below goal Initiate Gabapentin for neuropathy Counseled on Diabetic diet, my plate method, [id number] minutes of moderate intensity exercise/week Blood sugar logs with fasting goals of 80-120 mg/dl, random of less than 180 and in the event of sugars  less than 60 mg/dl or greater than 400 mg/dl encouraged to notify the clinic. Advised on the need for annual eye exams, annual foot exams, Pneumonia vaccine. - Glucose (CBG) - HgB A1c - Complete Metabolic Panel with GFR - Lipid panel - Insulin Glargine (LANTUS SOLOSTAR) 100 UNIT/ML Solostar Pen; Inject 38 Units into the skin daily at 10 pm.  Dispense: 30 mL; Refill: 6 - gabapentin (NEURONTIN) 300 MG capsule; Take 1 capsule (300 mg total) by mouth at bedtime.  Dispense: 30 capsule; Refill: 6  2. Left sided abdominal pain Likely musculoskeletal and posture related as she stands a lot at work Use NSAIDS, unable to muscle relaxant due to sedative side effects. If symptoms persists consider imaging.  3. Hyperlipidemia, unspecified hyperlipidemia type Controlled Counseled on low cholesterol diet - atorvastatin (LIPITOR) 20 MG tablet; TAKE 1 TABLET BY MOUTH DAILY AT 6 PM.  Dispense: 30 tablet; Refill: 6  4. Healthcare maintenance - flu shot   Health Care Maintenance: Flu shot today No orders of the defined types were placed in this encounter.   Follow-up: No follow-ups on file.       Charlott Rakes, MD, FAAFP. [facility] and [place], [place]   08/26/2019, 11:22 AM

## 2019-08-26 NOTE — Patient Instructions (Signed)
Once I review your test results, I will be in touch with you via 'my chart' or a phone call from my office (if you are not signed up for my chart).     Diabetic Neuropathy Diabetic neuropathy refers to nerve damage that is caused by diabetes (diabetes mellitus). Over time, people with diabetes can develop nerve damage throughout the body. There are several types of diabetic neuropathy:  Peripheral neuropathy. This is the most common type of diabetic neuropathy. It causes damage to nerves that carry signals between the spinal cord and other parts of the body (peripheral nerves). This usually affects nerves in the feet and legs first, and may eventually affect the hands and arms. The damage affects the ability to sense touch or temperature.  Autonomic neuropathy. This type causes damage to nerves that control involuntary functions (autonomic nerves). These nerves carry signals that control: ? Heartbeat. ? Body temperature. ? Blood pressure. ? Urination. ? Digestion. ? Sweating. ? Sexual function. ? Response to changing blood sugar (glucose) levels.  Focal neuropathy. This type of nerve damage affects one area of the body, such as an arm, a leg, or the face. The injury may involve one nerve or a small group of nerves. Focal neuropathy can be painful and unpredictable, and occurs most often in older adults with diabetes. This often develops suddenly, but usually improves over time and does not cause long-term problems.  Proximal neuropathy. This type of nerve damage affects the nerves of the thighs, hips, buttocks, or legs. It causes severe pain, weakness, and muscle death (atrophy), usually in the thigh muscles. It is more common among older men and people who have type 2 diabetes. The length of recovery time may vary. What are the causes? Peripheral, autonomic, and focal neuropathies are caused by diabetes that is not well controlled with treatment. The cause of proximal neuropathy is not  known, but it may be caused by inflammation related to uncontrolled blood glucose levels. What are the signs or symptoms? Peripheral neuropathy Peripheral neuropathy develops slowly over time. When the nerves of the feet and legs no longer work, you may experience:  Burning, stabbing, or aching pain in the legs or feet.  Pain or cramping in the legs or feet.  Loss of feeling (numbness) and inability to feel pressure or pain in the feet. This can lead to: ? Thick calluses or sores on areas of constant pressure. ? Ulcers. ? Reduced ability to feel temperature changes.  Foot deformities.  Muscle weakness.  Loss of balance or coordination. Autonomic neuropathy The symptoms of autonomic neuropathy vary depending on which nerves are affected. Symptoms may include:  Problems with digestion, such as: ? Nausea or vomiting. ? Poor appetite. ? Bloating. ? Diarrhea or constipation. ? Trouble swallowing. ? Losing weight without trying to.  Problems with the heart, blood and lungs, such as: ? Dizziness, especially when standing up. ? Fainting. ? Shortness of breath. ? Irregular heartbeat.  Bladder problems, such as: ? Trouble starting or stopping urination. ? Leaking urine. ? Trouble emptying the bladder. ? Urinary tract infections (UTIs).  Problems with other body functions, such as: ? Sweat. You may sweat too much or too little. ? Temperature. You might get hot easily. Or, you might feel cold more than usual. ? Sexual function. Men may not be able to get or maintain an erection. Women may have vaginal dryness and difficulty with arousal. Focal neuropathy Symptoms affect only one area of the body. Common symptoms include:  Numbness.  Tingling.  Burning pain.  Prickling feeling.  Very sensitive skin.  Weakness.  Inability to move (paralysis).  Muscle twitching.  Muscles getting smaller (wasting).  Poor coordination.  Double or blurred vision. Proximal  neuropathy  Sudden, severe pain in the hip, thigh, or buttocks. Pain may spread from the back into the legs (sciatica).  Pain and numbness in the arms and legs.  Tingling.  Loss of bladder control or bowel control.  Weakness and wasting of thigh muscles.  Difficulty getting up from a seated position.  Abdominal swelling.  Unexplained weight loss. How is this diagnosed? Diagnosis usually involves reviewing your medical history and any symptoms you have. Diagnosis varies depending on the type of neuropathy your health care provider suspects. Peripheral neuropathy Your health care provider will check areas that are affected by your nervous system (neurologic exam), such as your reflexes, how you move, and what you can feel. You may have other tests, such as:  Blood tests.  Removal and examination of fluid that surrounds the spinal cord (lumbar puncture).  CT scan.  MRI.  A test to check the nerves that control muscles (electromyogram, EMG).  Tests of how quickly messages pass through your nerves (nerve conduction velocity tests).  Removal of a small piece of nerve to be examined under a microscope (biopsy). Autonomic neuropathy You may have tests, such as:  Tests to measure your blood pressure and heart rate. This may include monitoring you while you are safely secured to an exam table that moves you from a lying position to an upright position (table tilt test).  Breathing tests to check your lungs.  Tests to check how food moves through the digestive system (gastric emptying tests).  Blood, sweat, or urine tests.  Ultrasound of your bladder.  Spinal fluid tests. Focal neuropathy This condition may be diagnosed with:  A neurologic exam.  CT scan.  MRI.  EMG.  Nerve conduction velocity tests. Proximal neuropathy There is no test to diagnose this type of neuropathy. You may have tests to rule out other possible causes of this type of neuropathy. Tests may  include:  X-rays of your spine and lumbar region.  Lumbar puncture.  MRI. How is this treated? The goal of treatment is to keep nerve damage from getting worse. The most important part of treatment is keeping your blood glucose level and your A1C level within your target range by following your diabetes management plan. Over time, maintaining lower blood glucose levels helps lessen symptoms. In some cases, you may need prescription pain medicine. Follow these instructions at home:  Lifestyle   Do not use any products that contain nicotine or tobacco, such as cigarettes and e-cigarettes. If you need help quitting, ask your health care provider.  Be physically active every day. Include strength training and balance exercises.  Follow a healthy meal plan.  Work with your health care provider to manage your blood pressure. General instructions  Follow your diabetes management plan as directed. ? Check your blood glucose levels as directed by your health care provider. ? Keep your blood glucose in your target range as directed by your health care provider. ? Have your A1C level checked at least two times a year, or as often as told by your health care provider.  Take over the counter and prescription medicines only as told by your health care provider. This includes insulin and diabetes medicine.  Do not drive or use heavy machinery while taking prescription pain medicines.  Check your  skin and feet every day for cuts, bruises, redness, blisters, or sores.  Keep all follow up visits as told by your health care provider. This is important. Contact a health care provider if:  You have burning, stabbing, or aching pain in your legs or feet.  You are unable to feel pressure or pain in your feet.  You develop problems with digestion, such as: ? Nausea. ? Vomiting. ? Bloating. ? Constipation. ? Diarrhea. ? Abdominal pain.  You have difficulty with urination, such as  inability: ? To control when you urinate (incontinence). ? To completely empty the bladder (retention).  You have palpitations.  You feel dizzy, weak, or faint when you stand up. Get help right away if:  You cannot urinate.  You have sudden weakness or loss of coordination.  You have trouble speaking.  You have pain or pressure in your chest.  You have an irregular heart beat.  You have sudden inability to move a part of your body. Summary  Diabetic neuropathy refers to nerve damage that is caused by diabetes. It can affect nerves throughout the entire body, causing numbness and pain in the arms, legs, digestive tract, heart, and other body systems.  Keep your blood glucose level and your blood pressure in your target range, as directed by your health care provider. This can help prevent neuropathy from getting worse.  Check your skin and feet every day for cuts, bruises, redness, blisters, or sores.  Do not use any products that contain nicotine or tobacco, such as cigarettes and e-cigarettes. If you need help quitting, ask your health care provider. This information is not intended to replace advice given to you by your health care provider. Make sure you discuss any questions you have with your health care provider. Document Revised: 09/11/2017 Document Reviewed: 09/03/2016 Elsevier Patient Education  2020 [employer].

## 2019-08-27 ENCOUNTER — Encounter: Payer: Self-pay | Admitting: Family Medicine

## 2019-08-27 DIAGNOSIS — Z23 Encounter for immunization: Secondary | ICD-10-CM | POA: Diagnosis not present

## 2019-08-27 LAB — CMP14+EGFR
ALT: 15 IU/L (ref 0–32)
AST: 19 IU/L (ref 0–40)
Albumin/Globulin Ratio: 1.5 (ref 1.2–2.2)
Albumin: 4.3 g/dL (ref 3.8–4.8)
Alkaline Phosphatase: 92 IU/L (ref 39–117)
BUN/Creatinine Ratio: 10 (ref 9–23)
BUN: 9 mg/dL (ref 6–24)
Bilirubin Total: 0.7 mg/dL (ref 0.0–1.2)
CO2: 27 mmol/L (ref 20–29)
Calcium: 9.7 mg/dL (ref 8.7–10.2)
Chloride: 97 mmol/L (ref 96–106)
Creatinine, Ser: 0.88 mg/dL (ref 0.57–1.00)
GFR calc Af Amer: 90 mL/min/{1.73_m2} (ref >59)
GFR calc non Af Amer: 78 mL/min/{1.73_m2} (ref >59)
Globulin, Total: 2.9 g/dL (ref 1.5–4.5)
Glucose: 140 mg/dL — ABNORMAL HIGH (ref 65–99)
Potassium: 3.6 mmol/L (ref 3.5–5.2)
Sodium: 138 mmol/L (ref 134–144)
Total Protein: 7.2 g/dL (ref 6.0–8.5)

## 2019-08-27 LAB — LIPID PANEL
Chol/HDL Ratio: 2.5 ratio (ref 0.0–4.4)
Cholesterol, Total: 161 mg/dL (ref 100–199)
HDL: 65 mg/dL (ref >39)
LDL Chol Calc (NIH): 85 mg/dL (ref 0–99)
Triglycerides: 53 mg/dL (ref 0–149)
VLDL Cholesterol Cal: 11 mg/dL (ref 5–40)

## 2019-09-07 ENCOUNTER — Telehealth: Payer: Self-pay | Admitting: Family Medicine

## 2019-09-07 NOTE — Telephone Encounter (Signed)
Patient called and requested for pcp opinion regarding her new Gabapentin medication. Patient stated that she has trouble taking medication and her new script was for capsules and that she does better with tablets.  Patient stated that she would be able to open the capsules and put it with pudding or applesauce to get the medication down. Although when she was in the hospital in the past she was advised not to do so. Patient stated if a new script was given please sent through [facility]. Please fu at your earliest convenience.

## 2019-09-08 MED ORDER — DULOXETINE HCL 60 MG PO CPEP: 60.0000 mg | ORAL_CAPSULE | Freq: Every day | ORAL | 3 refills | Status: DC

## 2019-09-08 NOTE — Telephone Encounter (Signed)
Patient states that she can not swallow gabapentin capsule and is requesting tablet form. Luke informed me that the higher strengths come in tablet form but we don't carry here at our pharmacy.   Patient may need medication change, she is requesting medication to be sent to [facility].

## 2019-09-08 NOTE — Telephone Encounter (Signed)
I have sent a prescription for Cymbalta to her pharmacy which will help with her neuropathy.  Cymbalta can also be used to treat anxiety and depression but he is effective in treating diabetic neuropathy.

## 2019-09-09 NOTE — Telephone Encounter (Signed)
Patient was called and informed of medication change and script being sent to pharmacy.

## 2019-10-07 ENCOUNTER — Ambulatory Visit: Payer: BC Managed Care – PPO | Admitting: Family Medicine

## 2019-11-18 ENCOUNTER — Ambulatory Visit: Payer: BC Managed Care – PPO | Admitting: Family Medicine

## 2020-09-26 ENCOUNTER — Other Ambulatory Visit: Payer: Self-pay | Admitting: Family Medicine

## 2020-09-26 DIAGNOSIS — Z794 Long term (current) use of insulin: Secondary | ICD-10-CM

## 2020-09-26 MED ORDER — LANTUS SOLOSTAR 100 UNIT/ML ~~LOC~~ SOPN: 38.0000 [IU] | PEN_INJECTOR | Freq: Every day | SUBCUTANEOUS | 1 refills | Status: DC

## 2020-09-26 NOTE — Addendum Note (Signed)
Addended by: Daisy Blossom, Annie Main L on: 09/26/2020 03:32 PM   Modules accepted: Orders

## 2020-09-26 NOTE — Addendum Note (Signed)
Addended by: Matilde Sprang on: 09/26/2020 12:42 PM   Modules accepted: Orders

## 2020-09-26 NOTE — Telephone Encounter (Signed)
Pt called and scheduled the next available appt, please advise she is completely out of her current supply.   Best contact: [phone]

## 2020-09-30 ENCOUNTER — Telehealth: Payer: Self-pay | Admitting: Family Medicine

## 2020-09-30 NOTE — Telephone Encounter (Signed)
Unfortunately since she has not had a visit in over a year I am unable to approve it.

## 2020-09-30 NOTE — Telephone Encounter (Signed)
Ill route to PCP for review.

## 2020-09-30 NOTE — Telephone Encounter (Signed)
Pt was called and a VM was left informing pt to return phone call. 

## 2020-09-30 NOTE — Telephone Encounter (Signed)
Copied from [place] [id number]. Topic: General - Inquiry >> Sep 30, 2020 10:59 AM Gillis Ends D wrote: Reason for CRM: [facility] called and wanted to get a medication changed from 30 days to 90 days to get it covered by insurance. They can be reached at [phone]. The reference number is [id number]. Please advise

## 2020-10-05 ENCOUNTER — Ambulatory Visit: Payer: BC Managed Care – PPO

## 2020-10-06 ENCOUNTER — Ambulatory Visit: Payer: BC Managed Care – PPO

## 2020-10-10 ENCOUNTER — Ambulatory Visit: Payer: BC Managed Care – PPO

## 2020-10-19 ENCOUNTER — Ambulatory Visit: Payer: BC Managed Care – PPO | Attending: Internal Medicine

## 2020-10-19 DIAGNOSIS — Z23 Encounter for immunization: Secondary | ICD-10-CM

## 2020-10-19 NOTE — Progress Notes (Signed)
   Covid-19 Vaccination Clinic  Name:  Krista Campbell    MRN: [id number] DOB: 1970-10-17  10/19/2020  Ms. [name] was observed post Covid-19 immunization for 15 minutes without incident. She was provided with Vaccine Information Sheet and instruction to access the V-Safe system.   Ms. [name] was instructed to call 911 with any severe reactions post vaccine: [place] Kitchen Difficulty breathing  . Swelling of face and throat  . A fast heartbeat  . A bad rash all over body  . Dizziness and weakness   Immunizations Administered    Name Date Dose VIS Date Route   Moderna Covid-19 Booster Vaccine 10/19/2020  1:05 PM 0.25 mL 06/01/2020 Intramuscular   Manufacturer: Moderna   Lot: [id number]   [place]: [id number]

## 2020-11-22 ENCOUNTER — Ambulatory Visit: Payer: BC Managed Care – PPO | Admitting: Family Medicine

## 2020-12-05 ENCOUNTER — Other Ambulatory Visit: Payer: Self-pay

## 2020-12-05 ENCOUNTER — Encounter: Payer: Self-pay | Admitting: Family Medicine

## 2020-12-05 ENCOUNTER — Ambulatory Visit: Payer: BC Managed Care – PPO | Attending: Family Medicine | Admitting: Family Medicine

## 2020-12-05 ENCOUNTER — Other Ambulatory Visit: Payer: Self-pay | Admitting: Pharmacist

## 2020-12-05 ENCOUNTER — Telehealth: Payer: Self-pay | Admitting: Family Medicine

## 2020-12-05 VITALS — BP 117/80 | HR 99 | Resp 20 | Ht 63.0 in | Wt 181.4 lb

## 2020-12-05 DIAGNOSIS — M25511 Pain in right shoulder: Secondary | ICD-10-CM | POA: Diagnosis not present

## 2020-12-05 DIAGNOSIS — G8929 Other chronic pain: Secondary | ICD-10-CM | POA: Diagnosis not present

## 2020-12-05 DIAGNOSIS — E785 Hyperlipidemia, unspecified: Secondary | ICD-10-CM

## 2020-12-05 DIAGNOSIS — Z794 Long term (current) use of insulin: Secondary | ICD-10-CM | POA: Diagnosis not present

## 2020-12-05 DIAGNOSIS — E1149 Type 2 diabetes mellitus with other diabetic neurological complication: Secondary | ICD-10-CM

## 2020-12-05 LAB — POCT GLYCOSYLATED HEMOGLOBIN (HGB A1C): HbA1c, POC (controlled diabetic range): 13.2 % — AB (ref 0.0–7.0)

## 2020-12-05 LAB — GLUCOSE, POCT (MANUAL RESULT ENTRY): POC Glucose: 244 mg/dl — AB (ref 70–99)

## 2020-12-05 MED ORDER — NAPROXEN 500 MG PO TABS: 500.0000 mg | ORAL_TABLET | Freq: Two times a day (BID) | ORAL | 3 refills | Status: DC

## 2020-12-05 MED ORDER — SEMGLEE 100 UNIT/ML ~~LOC~~ SOPN: 40.0000 [IU] | PEN_INJECTOR | Freq: Every day | SUBCUTANEOUS | 2 refills | Status: DC

## 2020-12-05 MED ORDER — OZEMPIC (0.25 OR 0.5 MG/DOSE) 2 MG/1.5ML ~~LOC~~ SOPN: 0.2500 mg | PEN_INJECTOR | SUBCUTANEOUS | 2 refills | Status: DC

## 2020-12-05 MED ORDER — LANTUS SOLOSTAR 100 UNIT/ML ~~LOC~~ SOPN: 40.0000 [IU] | PEN_INJECTOR | Freq: Every day | SUBCUTANEOUS | 6 refills | Status: DC

## 2020-12-05 MED ORDER — ATORVASTATIN CALCIUM 20 MG PO TABS: ORAL_TABLET | ORAL | 6 refills | Status: DC

## 2020-12-05 MED ORDER — CYCLOBENZAPRINE HCL 10 MG PO TABS: 10.0000 mg | ORAL_TABLET | Freq: Three times a day (TID) | ORAL | 3 refills | Status: DC | PRN

## 2020-12-05 NOTE — Telephone Encounter (Signed)
Pt had an appt today with her provider and discussed being prescribed this medication ozempic. She called wanting to talk with the clinical team about the cost because she would like to this medication.  Please follow up with pt regarding this issue. Her cell is [phone].

## 2020-12-05 NOTE — Patient Instructions (Signed)
Semaglutide injection solution What is this medicine? SEMAGLUTIDE (Sem a GLOO tide) is used to improve blood sugar control in adults with type 2 diabetes. This medicine may be used with other diabetes medicines. This drug may also reduce the risk of heart attack or stroke if you have type 2 diabetes and risk factors for heart disease. This medicine may be used for other purposes; ask your health care provider or pharmacist if you have questions. COMMON BRAND NAME(S): OZEMPIC What should I tell my health care provider before I take this medicine? They need to know if you have any of these conditions:  endocrine tumors (MEN 2) or if someone in your family had these tumors  eye disease, vision problems  history of pancreatitis  kidney disease  stomach problems  thyroid cancer or if someone in your family had thyroid cancer  an unusual or allergic reaction to semaglutide, other medicines, foods, dyes, or preservatives  pregnant or trying to get pregnant  breast-feeding How should I use this medicine? This medicine is for injection under the skin of your upper leg (thigh), stomach area, or upper arm. It is given once every week (every 7 days). You will be taught how to prepare and give this medicine. Use exactly as directed. Take your medicine at regular intervals. Do not take it more often than directed. If you use this medicine with insulin, you should inject this medicine and the insulin separately. Do not mix them together. Do not give the injections right next to each other. Change (rotate) injection sites with each injection. It is important that you put your used needles and syringes in a special sharps container. Do not put them in a trash can. If you do not have a sharps container, call your pharmacist or healthcare provider to get one. A special MedGuide will be given to you by the pharmacist with each prescription and refill. Be sure to read this information carefully each  time. This drug comes with INSTRUCTIONS FOR USE. Ask your pharmacist for directions on how to use this drug. Read the information carefully. Talk to your pharmacist or health care provider if you have questions. Talk to your pediatrician regarding the use of this medicine in children. Special care may be needed. Overdosage: If you think you have taken too much of this medicine contact a poison control center or emergency room at once. NOTE: This medicine is only for you. Do not share this medicine with others. What if I miss a dose? If you miss a dose, take it as soon as you can within 5 days after the missed dose. Then take your next dose at your regular weekly time. If it has been longer than 5 days after the missed dose, do not take the missed dose. Take the next dose at your regular time. Do not take double or extra doses. If you have questions about a missed dose, contact your health care provider for advice. What may interact with this medicine?  other medicines for diabetes Many medications may cause changes in blood sugar, these include:  alcohol containing beverages  antiviral medicines for HIV or AIDS  aspirin and aspirin-like drugs  certain medicines for blood pressure, heart disease, irregular heart beat  chromium  diuretics  female hormones, such as estrogens or progestins, birth control pills  fenofibrate  gemfibrozil  isoniazid  lanreotide  female hormones or anabolic steroids  MAOIs like Carbex, Eldepryl, Marplan, Nardil, and Parnate  medicines for weight loss  medicines for   allergies, asthma, cold, or cough  medicines for depression, anxiety, or psychotic disturbances  niacin  nicotine  NSAIDs, medicines for pain and inflammation, like ibuprofen or naproxen  octreotide  pasireotide  pentamidine  phenytoin  probenecid  quinolone antibiotics such as ciprofloxacin, levofloxacin, ofloxacin  some herbal dietary supplements  steroid medicines  such as prednisone or cortisone  sulfamethoxazole; trimethoprim  thyroid hormones Some medications can hide the warning symptoms of low blood sugar (hypoglycemia). You may need to monitor your blood sugar more closely if you are taking one of these medications. These include:  beta-blockers, often used for high blood pressure or heart problems (examples include atenolol, metoprolol, propranolol)  clonidine  guanethidine  reserpine This list may not describe all possible interactions. Give your health care provider a list of all the medicines, herbs, non-prescription drugs, or dietary supplements you use. Also tell them if you smoke, drink alcohol, or use illegal drugs. Some items may interact with your medicine. What should I watch for while using this medicine? Visit your doctor or health care professional for regular checks on your progress. Drink plenty of fluids while taking this medicine. Check with your doctor or health care professional if you get an attack of severe diarrhea, nausea, and vomiting. The loss of too much body fluid can make it dangerous for you to take this medicine. A test called the HbA1C (A1C) will be monitored. This is a simple blood test. It measures your blood sugar control over the last 2 to 3 months. You will receive this test every 3 to 6 months. Learn how to check your blood sugar. Learn the symptoms of low and high blood sugar and how to manage them. Always carry a quick-source of sugar with you in case you have symptoms of low blood sugar. Examples include hard sugar candy or glucose tablets. Make sure others know that you can choke if you eat or drink when you develop serious symptoms of low blood sugar, such as seizures or unconsciousness. They must get medical help at once. Tell your doctor or health care professional if you have high blood sugar. You might need to change the dose of your medicine. If you are sick or exercising more than usual, you might need  to change the dose of your medicine. Do not skip meals. Ask your doctor or health care professional if you should avoid alcohol. Many nonprescription cough and cold products contain sugar or alcohol. These can affect blood sugar. Pens should never be shared. Even if the needle is changed, sharing may result in passing of viruses like hepatitis or HIV. Wear a medical ID bracelet or chain, and carry a card that describes your disease and details of your medicine and dosage times. Do not become pregnant while taking this medicine. Women should inform their doctor if they wish to become pregnant or think they might be pregnant. There is a potential for serious side effects to an unborn child. Talk to your health care professional or pharmacist for more information. What side effects may I notice from receiving this medicine? Side effects that you should report to your doctor or health care professional as soon as possible:  allergic reactions like skin rash, itching or hives, swelling of the face, lips, or tongue  breathing problems  changes in vision  diarrhea that continues or is severe  lump or swelling on the neck  severe nausea  signs and symptoms of infection like fever or chills; cough; sore throat; pain or trouble   trouble swallowing unusual stomach upset or pain vomiting Side effects that usually do not require medical attention (report to your doctor or health care professional if they continue or are bothersome): constipation diarrhea nausea pain, redness, or irritation at site where injected stomach upset This list may not describe all possible side effects. Call your doctor for medical advice about side effects. You may report side  effects to FDA at 1-800-FDA-1088. Where should I keep my medicine? Keep out of the reach of children. Store unopened pens in a refrigerator between 2 and 8 degrees C (36 and 46 degrees F). Do not freeze. Protect from light and heat. After you first use the pen, it can be stored for 56 days at room temperature between 15 and 30 degrees C (59 and 86 degrees F) or in a refrigerator. Throw away your used pen after 56 days or after the expiration date, whichever comes first. Do not store your pen with the needle attached. If the needle is left on, medicine may leak from the pen. NOTE: This sheet is a summary. It may not cover all possible information. If you have questions about this medicine, talk to your doctor, pharmacist, or health care provider.  2021 Elsevier/Gold Standard (2019-04-14 09:41:51)  

## 2020-12-05 NOTE — Telephone Encounter (Signed)
Copied from [place] [id number]. Topic: General - Other >> Dec 05, 2020  3:21 PM Leward Quan A wrote: Reason for CRM: Patient called in asking for Dr Margarita Rana to please write a new Rx ad send to her pharmacy for [facility] (insulin glargine-yfgn) injection 100 Units since that is what her insurance request. Any questions please call  Ph# [phone]

## 2020-12-05 NOTE — Telephone Encounter (Signed)
Patient was called and all question were answered and new insulin was sent over to pharmacy.

## 2020-12-05 NOTE — Progress Notes (Signed)
 Subjective:  Patient ID: Krista Campbell, female    DOB: 10/13/1970  Age: 49 y.o. MRN: [id number]  CC: Diabetes   HPI Krista Campbell  is a 49-year-old female with history of type 2 diabetes mellitus (A1c 13.2), hyperlipidemia, obesity who presents today for a follow-up visit Blood sugars have been in the 200s She works 10:30 - 8:30am and does not admit to excessive snacking. She has been compliant with her medications. She complains of "problems with her shoulders". Has pain in her R shoulder and this extends for her R posterior scapular anteriorly to her shoulder. She was seen at Urgent Care and was told she had arthritic changes in her back and prescribed Cyclobenzaprine and Naproxen which she has run out of.  Past Medical History:  Diagnosis Date  . Diabetes mellitus without complication (HCC)   . Hyperlipidemia     History reviewed. No pertinent surgical history.  Family History  Problem Relation Age of Onset  . Kidney disease Father     No Known Allergies  Outpatient Medications Prior to Visit  Medication Sig Dispense Refill  . Bayer Microlet Lancets lancets Use to check blood sugar 3 times daily as directed. E11.9. Z79.4 100 each 11  . Blood Glucose Monitoring Suppl (CONTOUR BLOOD GLUCOSE SYSTEM) DEVI Use to check blood sugar 3 times daily as directed. E11.9. Z79.4 1 each 0  . glucose blood test strip Use to check blood sugar 3 times daily as directed. E11.9. Z79.4 100 each 12  . Insulin Pen Needle (TRUEPLUS PEN NEEDLES) 32G X 4 MM MISC USE AS DIRECTED 100 each 11  . Insulin Syringe-Needle U-100 (BD INSULIN SYRINGE ULTRAFINE) 31G X 15/64" 0.5 ML MISC USE AS DIRECTED 100 each 12  . Magnesium 400 MG TABS Take 400 mg by mouth 2 (two) times daily. 60 tablet 2  . atorvastatin (LIPITOR) 20 MG tablet TAKE 1 TABLET BY MOUTH DAILY AT 6 PM. 30 tablet 6  . insulin glargine (LANTUS SOLOSTAR) 100 UNIT/ML Solostar Pen Inject 38 Units into the skin daily at 10 pm. 15 mL 1  .  clotrimazole-betamethasone (LOTRISONE) cream Apply 1 application topically 2 (two) times daily. (Patient not taking: Reported on 12/05/2020) 30 g 1  . DULoxetine (CYMBALTA) 60 MG capsule Take 1 capsule (60 mg total) by mouth daily. (Patient not taking: Reported on 12/05/2020) 30 capsule 3  . Magnesium 500 MG TABS Take by mouth. (Patient not taking: Reported on 12/05/2020)    . metroNIDAZOLE (METROGEL VAGINAL) 0.75 % vaginal gel Place 1 Applicatorful vaginally at bedtime. (Patient not taking: Reported on 12/05/2020) 70 g 0   No facility-administered medications prior to visit.     ROS Review of Systems  Constitutional: Negative for activity change, appetite change and fatigue.  HENT: Negative for congestion, sinus pressure and sore throat.   Eyes: Negative for visual disturbance.  Respiratory: Negative for cough, chest tightness, shortness of breath and wheezing.   Cardiovascular: Negative for chest pain and palpitations.  Gastrointestinal: Negative for abdominal distention, abdominal pain and constipation.  Endocrine: Negative for polydipsia.  Genitourinary: Negative for dysuria and frequency.  Musculoskeletal:       See HPI  Skin: Negative for rash.  Neurological: Negative for tremors, light-headedness and numbness.  Hematological: Does not bruise/bleed easily.  Psychiatric/Behavioral: Negative for agitation and behavioral problems.    Objective:  BP 117/80   Pulse 99   Resp 20   Ht 5' 3" (1.6 m)   Wt 181 lb 6.4 oz (82.3   kg)   SpO2 97%   BMI 32.13 kg/m   BP/Weight 12/05/2020 08/26/2019 09/24/2018  Systolic BP 117 111 102  Diastolic BP 80 73 66  Wt. (Lbs) 181.4 188 179  BMI 32.13 35.52 33.82      Physical Exam Constitutional:      Appearance: She is well-developed.  Neck:     Vascular: No JVD.  Cardiovascular:     Rate and Rhythm: Normal rate.     Heart sounds: Normal heart sounds. No murmur heard.   Pulmonary:     Effort: Pulmonary effort is normal.     Breath  sounds: Normal breath sounds. No wheezing or rales.  Chest:     Chest wall: No tenderness.  Abdominal:     General: Bowel sounds are normal. There is no distension.     Palpations: Abdomen is soft. There is no mass.     Tenderness: There is no abdominal tenderness.  Musculoskeletal:        General: Normal range of motion.     Right lower leg: No edema.     Left lower leg: No edema.     Comments: Minimal tenderness on palpation of midpoint of right posterior scapula.  No tenderness on palpation of the R shoulder  Neurological:     Mental Status: She is alert and oriented to person, place, and time.  Psychiatric:        Mood and Affect: Mood normal.     CMP Latest Ref Rng & Units 08/26/2019 09/24/2018 12/23/2017  Glucose 65 - 99 mg/dL 140(H) 391(H) 115(H)  BUN 6 - 24 mg/dL 9 11 12  Creatinine 0.57 - 1.00 mg/dL 0.88 0.98 0.99  Sodium 134 - 144 mmol/L 138 136 140  Potassium 3.5 - 5.2 mmol/L 3.6 4.0 3.8  Chloride 96 - 106 mmol/L 97 98 99  CO2 20 - 29 mmol/L 27 23 24  Calcium 8.7 - 10.2 mg/dL 9.7 9.1 9.6  Total Protein 6.0 - 8.5 g/dL 7.2 - 7.4  Total Bilirubin 0.0 - 1.2 mg/dL 0.7 - 0.7  Alkaline Phos 39 - 117 IU/L 92 - 88  AST 0 - 40 IU/L 19 - 23  ALT 0 - 32 IU/L 15 - 17    Lipid Panel     Component Value Date/Time   CHOL 161 08/26/2019 1449   TRIG 53 08/26/2019 1449   HDL 65 08/26/2019 1449   CHOLHDL 2.5 08/26/2019 1449   CHOLHDL 2.2 10/19/2015 1001   VLDL 9 10/19/2015 1001   LDLCALC 85 08/26/2019 1449    CBC    Component Value Date/Time   WBC 4.9 10/19/2015 1001   RBC 4.17 10/19/2015 1001   HGB 11.8 (L) 10/19/2015 1001   HCT 36.7 10/19/2015 1001   PLT 320 10/19/2015 1001   MCV 88.0 10/19/2015 1001   MCH 28.3 10/19/2015 1001   MCHC 32.2 10/19/2015 1001   RDW 14.2 10/19/2015 1001   LYMPHSABS 2.3 05/19/2014 1155   MONOABS 0.7 05/19/2014 1155   EOSABS 0.0 05/19/2014 1155   BASOSABS 0.0 05/19/2014 1155    Lab Results  Component Value Date   HGBA1C 13.2 (A)  12/05/2020    Assessment & Plan:  1. Type 2 diabetes mellitus with other neurologic complication, with long-term current use of insulin (HCC) Uncontrolled with A1c of 13.2; goal is less than 7.0 We will initiate Ozempic and she will follow-up with the clinical pharmacist for up titration in her dose.  Lantus has been increased from 38 to   40 units. Counseled on side effects of Ozempic Counseled on Diabetic diet, my plate method, 150 minutes of moderate intensity exercise/week Blood sugar logs with fasting goals of 80-120 mg/dl, random of less than 180 and in the event of sugars less than 60 mg/dl or greater than 400 mg/dl encouraged to notify the clinic. Advised on the need for annual eye exams, annual foot exams, Pneumonia vaccine. - POCT glucose (manual entry) - POCT glycosylated hemoglobin (Hb A1C) - CMP14+EGFR - Lipid panel - Microalbumin / creatinine urine ratio - insulin glargine (LANTUS SOLOSTAR) 100 UNIT/ML Solostar Pen; Inject 40 Units into the skin daily at 10 pm.  Dispense: 30 mL; Refill: 6 - Semaglutide,0.25 or 0.5MG/DOS, (OZEMPIC, 0.25 OR 0.5 MG/DOSE,) 2 MG/1.5ML SOPN; Inject 0.25 mg into the skin once a week.  Dispense: 1.5 mL; Refill: 2  2. Hyperlipidemia, unspecified hyperlipidemia type Controlled Low-cholesterol diet - atorvastatin (LIPITOR) 20 MG tablet; TAKE 1 TABLET BY MOUTH DAILY AT 6 PM.  Dispense: 30 tablet; Refill: 6  3. Chronic right shoulder pain Uncontrolled due to running out of medications which I have refilled - cyclobenzaprine (FLEXERIL) 10 MG tablet; Take 1 tablet (10 mg total) by mouth 3 (three) times daily as needed for muscle spasms.  Dispense: 60 tablet; Refill: 3 - naproxen (NAPROSYN) 500 MG tablet; Take 1 tablet (500 mg total) by mouth 2 (two) times daily with a meal.  Dispense: 30 tablet; Refill: 3    Meds ordered this encounter  Medications  . atorvastatin (LIPITOR) 20 MG tablet    Sig: TAKE 1 TABLET BY MOUTH DAILY AT 6 PM.    Dispense:  30  tablet    Refill:  6  . insulin glargine (LANTUS SOLOSTAR) 100 UNIT/ML Solostar Pen    Sig: Inject 40 Units into the skin daily at 10 pm.    Dispense:  30 mL    Refill:  6    Dose increase  . cyclobenzaprine (FLEXERIL) 10 MG tablet    Sig: Take 1 tablet (10 mg total) by mouth 3 (three) times daily as needed for muscle spasms.    Dispense:  60 tablet    Refill:  3  . naproxen (NAPROSYN) 500 MG tablet    Sig: Take 1 tablet (500 mg total) by mouth 2 (two) times daily with a meal.    Dispense:  30 tablet    Refill:  3  . Semaglutide,0.25 or 0.5MG/DOS, (OZEMPIC, 0.25 OR 0.5 MG/DOSE,) 2 MG/1.5ML SOPN    Sig: Inject 0.25 mg into the skin once a week.    Dispense:  1.5 mL    Refill:  2    Then increase to 0.5 mg after 1 month    Follow-up: Return in about 1 month (around 01/04/2021) for Blood sugar evaluation with Luke; PCP 3 months..       Enobong Newlin, MD, FAAFP. [facility] [place], [place] [phone]   12/05/2020, 1:43 PM 

## 2020-12-06 LAB — CMP14+EGFR
ALT: 78 IU/L — ABNORMAL HIGH (ref 0–32)
AST: 41 IU/L — ABNORMAL HIGH (ref 0–40)
Albumin/Globulin Ratio: 1.5 (ref 1.2–2.2)
Albumin: 4.7 g/dL (ref 3.8–4.8)
Alkaline Phosphatase: 157 IU/L — ABNORMAL HIGH (ref 44–121)
BUN/Creatinine Ratio: 10 (ref 9–23)
BUN: 9 mg/dL (ref 6–24)
Bilirubin Total: 0.5 mg/dL (ref 0.0–1.2)
CO2: 26 mmol/L (ref 20–29)
Calcium: 10 mg/dL (ref 8.7–10.2)
Chloride: 97 mmol/L (ref 96–106)
Creatinine, Ser: 0.93 mg/dL (ref 0.57–1.00)
Globulin, Total: 3.1 g/dL (ref 1.5–4.5)
Glucose: 229 mg/dL — ABNORMAL HIGH (ref 65–99)
Potassium: 4.7 mmol/L (ref 3.5–5.2)
Sodium: 140 mmol/L (ref 134–144)
Total Protein: 7.8 g/dL (ref 6.0–8.5)
eGFR: 75 mL/min/{1.73_m2} (ref >59)

## 2020-12-06 LAB — LIPID PANEL
Chol/HDL Ratio: 3.8 ratio (ref 0.0–4.4)
Cholesterol, Total: 282 mg/dL — ABNORMAL HIGH (ref 100–199)
HDL: 74 mg/dL (ref >39)
LDL Chol Calc (NIH): 188 mg/dL — ABNORMAL HIGH (ref 0–99)
Triglycerides: 117 mg/dL (ref 0–149)
VLDL Cholesterol Cal: 20 mg/dL (ref 5–40)

## 2020-12-06 LAB — MICROALBUMIN / CREATININE URINE RATIO
Creatinine, Urine: 125.3 mg/dL
Microalb/Creat Ratio: 52 mg/g creat — ABNORMAL HIGH (ref 0–29)
Microalbumin, Urine: 64.9 ug/mL

## 2020-12-06 NOTE — Telephone Encounter (Signed)
Rx was sent at the end of the day yesterday. Alycia informed the patient.

## 2020-12-15 ENCOUNTER — Other Ambulatory Visit: Payer: Self-pay | Admitting: Family Medicine

## 2020-12-15 MED ORDER — TRUEPLUS PEN NEEDLES 32G X 4 MM MISC: 11 refills | Status: DC

## 2020-12-15 NOTE — Telephone Encounter (Signed)
Pt called and stated that she leaves to go out of town tomorrow nad was not given an Rx for pen needles for her insulin/ Please advise and send refill to   [facility] [address]  [place], [place] [address]  Phone:  [phone] Fax:  [phone]

## 2021-01-04 ENCOUNTER — Ambulatory Visit: Payer: BC Managed Care – PPO | Attending: Family Medicine | Admitting: Pharmacist

## 2021-01-04 ENCOUNTER — Other Ambulatory Visit: Payer: Self-pay

## 2021-01-04 ENCOUNTER — Encounter: Payer: Self-pay | Admitting: Pharmacist

## 2021-01-04 DIAGNOSIS — E1149 Type 2 diabetes mellitus with other diabetic neurological complication: Secondary | ICD-10-CM | POA: Diagnosis not present

## 2021-01-04 DIAGNOSIS — Z794 Long term (current) use of insulin: Secondary | ICD-10-CM | POA: Diagnosis not present

## 2021-01-04 LAB — GLUCOSE, POCT (MANUAL RESULT ENTRY): POC Glucose: 241 mg/dl — AB (ref 70–99)

## 2021-01-04 MED ORDER — OZEMPIC (0.25 OR 0.5 MG/DOSE) 2 MG/1.5ML ~~LOC~~ SOPN: 0.5000 mg | PEN_INJECTOR | SUBCUTANEOUS | 2 refills | Status: DC

## 2021-01-04 NOTE — Progress Notes (Signed)
    S:    PCP: Dr. Margarita Rana   No chief complaint on file.  Patient arrives in good spirits. Presents for diabetes evaluation, education, and management Patient was referred and last seen by Primary Care Provider on 12/05/2020.    Family/Social History:  -FHx: CKD -Tobacco: former smoker  -Alcohol: denies any use   Insurance coverage/medication affordability: BCBS  Medication adherence reported. She admits today that her A1c is above goal because she became lazy at taking her medications prior to her last PCP visit. She is now back on track and motivated to improve medication adherence.    Current diabetes medications include: Semglee 40u daily, Ozempic 0.25 mg weekly   Patient denies hypoglycemic events.  Patient reported dietary habits:  - Reports following a diabetic diet, however, after her POCT glucose was resulted in clinic today she admitted to eating a biscuit, potatoes this morning  Patient-reported exercise habits:  - Exercise reported    Patient denies nocturia (nighttime urination).  Patient denies neuropathy (nerve pain). Patient denies visual changes. Patient reports self foot exams.     O:  POCT: 241  Lab Results  Component Value Date   HGBA1C 13.2 (A) 12/05/2020   There were no vitals filed for this visit.  Lipid Panel     Component Value Date/Time   CHOL 282 (H) 12/05/2020 1202   TRIG 117 12/05/2020 1202   HDL 74 12/05/2020 1202   CHOLHDL 3.8 12/05/2020 1202   CHOLHDL 2.2 10/19/2015 1001   VLDL 9 10/19/2015 1001   LDLCALC 188 (H) 12/05/2020 1202   Home fasting blood sugars: no meter. Endorses 80s-130s at home.   Clinical Atherosclerotic Cardiovascular Disease (ASCVD): No  The 10-year ASCVD risk score Mikey Bussing DC Jr., et al., 2013) is: 2.5%   Values used to calculate the score:     Age: 26 years     Sex: Female     Is Non-Hispanic African American: Yes     Diabetic: Yes     Tobacco smoker: No     Systolic Blood Pressure: 321 mmHg     Is BP  treated: No     HDL Cholesterol: 74 mg/dL     Total Cholesterol: 282 mg/dL   A/P: Diabetes longstanding currently above goal. Reported home glycemic control is improving. Patient is able to verbalize appropriate hypoglycemia management plan. Medication adherence appears appropriate. -Continued  Semglee at current dose for now. Will see her back in 1 month to reassess. We may need to decrease Semglee dose in the future to avoid hypoglycemia.  -Increased dose of Ozempic to 0.5 mg subq weekly. Return in 1 month for titration. -Extensively discussed pathophysiology of diabetes, recommended lifestyle interventions, dietary effects on blood sugar control -Counseled on s/sx of and management of hypoglycemia -Next A1C anticipated 02/2021.   Written patient instructions provided.  Total time in face to face counseling 30 minutes.   Follow up Pharmacist Clinic Visit in 1 month.    Benard Halsted, PharmD, [place], [place] [phone]

## 2021-02-06 ENCOUNTER — Other Ambulatory Visit: Payer: Self-pay

## 2021-02-06 ENCOUNTER — Ambulatory Visit: Payer: BC Managed Care – PPO | Attending: Family Medicine | Admitting: Pharmacist

## 2021-02-06 DIAGNOSIS — E1149 Type 2 diabetes mellitus with other diabetic neurological complication: Secondary | ICD-10-CM

## 2021-02-06 DIAGNOSIS — Z794 Long term (current) use of insulin: Secondary | ICD-10-CM | POA: Diagnosis not present

## 2021-02-06 LAB — GLUCOSE, POCT (MANUAL RESULT ENTRY)
POC Glucose: 110 mg/dl — AB (ref 70–99)
POC Glucose: 58 mg/dl — AB (ref 70–99)

## 2021-02-06 MED ORDER — SEMGLEE 100 UNIT/ML ~~LOC~~ SOPN: 36.0000 [IU] | PEN_INJECTOR | Freq: Every day | SUBCUTANEOUS | 2 refills | Status: DC

## 2021-02-06 NOTE — Progress Notes (Signed)
    S:    PCP: Dr. Margarita Rana   No chief complaint on file.  Patient arrives in good spirits. Presents for diabetes evaluation, education, and management. Patient was referred and last seen by Primary Care Provider on 12/05/2020. I increased her Ozempic to 0.5 mg weekly at her visit with pharmacy on 01/04/2021. She endorses diarrhea since dose increase but reports that this does not occur frequently. Denies abdominal pain, nausea.   Family/Social History:  -FHx: CKD -Tobacco: former smoker  -Alcohol: denies any use   Insurance coverage/medication affordability: BCBS  Medication adherence reported.  Current diabetes medications include: Semglee 40u daily, Ozempic 0.5 mg weekly   Patient denies hypoglycemic events. She is not checking at home, however, her CBG on arrival today is 46. She attributes this to not eating since 6 PM yesterday. She has not taken medications since her PM dose of Semglee last night.  She denies any symptoms of hypoglycemia at home. She is not symptomatic today.   Patient reported dietary habits:  - Reports following a diabetic diet  Patient-reported exercise habits:  - Exercise reported    Patient denies nocturia (nighttime urination).  Patient denies neuropathy (nerve pain). Patient denies visual changes. Patient reports self foot exams.     O:  POCT: 58 Given juice and apple sauce Recheck: 110  Lab Results  Component Value Date   HGBA1C 13.2 (A) 12/05/2020   There were no vitals filed for this visit.  Lipid Panel     Component Value Date/Time   CHOL 282 (H) 12/05/2020 1202   TRIG 117 12/05/2020 1202   HDL 74 12/05/2020 1202   CHOLHDL 3.8 12/05/2020 1202   CHOLHDL 2.2 10/19/2015 1001   VLDL 9 10/19/2015 1001   LDLCALC 188 (H) 12/05/2020 1202   Home fasting blood sugars: no meter with her not checking at home  Clinical Atherosclerotic Cardiovascular Disease (ASCVD): No  The 10-year ASCVD risk score Mikey Bussing DC Jr., et al., 2013) is: 2.5%    Values used to calculate the score:     Age: 50 years     Sex: Female     Is Non-Hispanic African American: Yes     Diabetic: Yes     Tobacco smoker: No     Systolic Blood Pressure: 431 mmHg     Is BP treated: No     HDL Cholesterol: 74 mg/dL     Total Cholesterol: 282 mg/dL   A/P: Diabetes longstanding currently above goal. Patient is able to verbalize appropriate hypoglycemia management plan. Medication adherence appears appropriate. -Decrease Semglee to 36 units daily d/t hypoglycemia. Encouraged patient to resume checking CBGs at home.  -Continue Ozempic 0.5 mg subq weekly. -Extensively discussed pathophysiology of diabetes, recommended lifestyle interventions, dietary effects on blood sugar control -Counseled on s/sx of and management of hypoglycemia -Next A1C anticipated 02/2021.   Written patient instructions provided.  Total time in face to face counseling 30 minutes.   Follow up PCP  Clinic Visit in 1 month.    Benard Halsted, PharmD, [place], [place] [phone]

## 2021-02-08 ENCOUNTER — Encounter: Payer: Self-pay | Admitting: Family Medicine

## 2021-02-17 ENCOUNTER — Other Ambulatory Visit: Payer: BC Managed Care – PPO

## 2021-03-07 ENCOUNTER — Ambulatory Visit: Payer: BC Managed Care – PPO | Admitting: Family Medicine

## 2021-04-20 ENCOUNTER — Other Ambulatory Visit: Payer: Self-pay | Admitting: Family Medicine

## 2021-04-20 DIAGNOSIS — E1149 Type 2 diabetes mellitus with other diabetic neurological complication: Secondary | ICD-10-CM

## 2021-04-21 NOTE — Telephone Encounter (Signed)
Requested medications are due for refill today No  Requested medications are on the active medication list yes  Last refill 9/7  Last visit 01/04/21  Future visit scheduled No  Notes to clinic No show 7/26, canceled 2 other appts. Has just filled 04/19/21.

## 2021-04-25 ENCOUNTER — Other Ambulatory Visit: Payer: Self-pay | Admitting: Family Medicine

## 2021-05-23 ENCOUNTER — Other Ambulatory Visit: Payer: Self-pay | Admitting: Family Medicine

## 2021-05-23 DIAGNOSIS — E1149 Type 2 diabetes mellitus with other diabetic neurological complication: Secondary | ICD-10-CM

## 2021-05-23 MED ORDER — OZEMPIC (0.25 OR 0.5 MG/DOSE) 2 MG/1.5ML ~~LOC~~ SOPN: PEN_INJECTOR | SUBCUTANEOUS | 1 refills | Status: DC

## 2021-05-23 NOTE — Telephone Encounter (Signed)
Patient called and advised she will need an appointment for medication to be refilled, patient verbalized understanding. Appointment scheduled for Thursday 07/13/21 at 1110 with Dr. Margarita Rana. Advised refill will be given, but will need to be seen for additional refills after this one, patient verbalized understanding.

## 2021-05-23 NOTE — Telephone Encounter (Signed)
Medication Refill - Medication: Ozempic  Has the patient contacted their pharmacy? Yes.   Pt states that she contacted pharmacy and they are needing to have a new prescription. Pt states that she is completely out of medication. Please advise.  (Agent: If no, request that the patient contact the pharmacy for the refill.) (Agent: If yes, when and what did the pharmacy advise?)  Preferred Pharmacy (with phone number or street name):  [facility] [place], [address]  [place] [address]  Phone: [phone] Fax: [phone]  Hours: Not open 24 hours   Has the patient been seen for an appointment in the last year OR does the patient have an upcoming appointment? Yes.    Agent: Please be advised that RX refills may take up to 3 business days. We ask that you follow-up with your pharmacy.

## 2021-05-23 NOTE — Telephone Encounter (Signed)
Requested Prescriptions  Pending Prescriptions Disp Refills  . Semaglutide,0.25 or 0.5MG /DOS, (OZEMPIC, 0.25 OR 0.5 MG/DOSE,) 2 MG/1.5ML SOPN 1.5 mL 0     Endocrinology:  Diabetes - GLP-1 Receptor Agonists Failed - 05/23/2021  5:50 PM      Failed - HBA1C is between 0 and 7.9 and within 180 days    HbA1c, POC (controlled diabetic range)  Date Value Ref Range Status  12/05/2020 13.2 (A) 0.0 - 7.0 % Final         Passed - Valid encounter within last 6 months    Recent Outpatient Visits          3 months ago Type 2 diabetes mellitus with other neurologic complication, with long-term current use of insulin [facility])   [place], Annie Main L, RPH-CPP   4 months ago Type 2 diabetes mellitus with other neurologic complication, with long-term current use of insulin [facility])   [place], Annie Main L, RPH-CPP   5 months ago Type 2 diabetes mellitus with other neurologic complication, with long-term current use of insulin ([place])   [place], Pablo, MD   1 year ago Type 2 diabetes mellitus with other neurologic complication, with long-term current use of insulin ([place])   [place], Charlane Ferretti, MD   2 years ago Hyperlipidemia, unspecified hyperlipidemia type   [place], MD      Future Appointments            In 1 month Charlott Rakes, MD [place]

## 2021-05-25 ENCOUNTER — Other Ambulatory Visit: Payer: Self-pay | Admitting: Family Medicine

## 2021-05-26 NOTE — Telephone Encounter (Signed)
Requested medication (s) are due for refill today: -  Requested medication (s) are on the active medication list: ended 02/06/21  Last refill:  12/05/20  Future visit scheduled: yes  Notes to clinic:  med not assigned to a protocol- duplicate request attached   Requested Prescriptions  Pending Prescriptions Disp Refills   SEMGLEE, YFGN, 100 UNIT/ML Pen [Pharmacy Med Name: SEMGLEE (YFGN)100U/ML PF  PEN 3ML] 15 mL     Sig: ADMINISTER 40 UNITS UNDER THE SKIN DAILY     Off-Protocol Failed - 05/25/2021  3:43 PM      Failed - Medication not assigned to a protocol, review manually.      Passed - Valid encounter within last 12 months    Recent Outpatient Visits           3 months ago Type 2 diabetes mellitus with other neurologic complication, with long-term current use of insulin ([place])   [place], Annie Main L, RPH-CPP   4 months ago Type 2 diabetes mellitus with other neurologic complication, with long-term current use of insulin [facility])   [place], Annie Main L, RPH-CPP   5 months ago Type 2 diabetes mellitus with other neurologic complication, with long-term current use of insulin ([place])   [place], Charlane Ferretti, MD   1 year ago Type 2 diabetes mellitus with other neurologic complication, with long-term current use of insulin ([place])   [place], Charlane Ferretti, MD   2 years ago Hyperlipidemia, unspecified hyperlipidemia type   [place], Enobong, MD       Future Appointments             In 1 month Charlott Rakes, MD [place], YFGN, 100 UNIT/ML Pen [Pharmacy Med Name: SEMGLEE (YFGN)100U/ML PF  PEN 3ML] 15 mL     Sig: ADMINISTER 40 UNITS UNDER THE SKIN DAILY     Off-Protocol Failed - 05/25/2021  3:43 PM      Failed - Medication not  assigned to a protocol, review manually.      Passed - Valid encounter within last 12 months    Recent Outpatient Visits           3 months ago Type 2 diabetes mellitus with other neurologic complication, with long-term current use of insulin [facility])   [place], Annie Main L, RPH-CPP   4 months ago Type 2 diabetes mellitus with other neurologic complication, with long-term current use of insulin [facility])   [place], Annie Main L, RPH-CPP   5 months ago Type 2 diabetes mellitus with other neurologic complication, with long-term current use of insulin ([place])   [place], Charlane Ferretti, MD   1 year ago Type 2 diabetes mellitus with other neurologic complication, with long-term current use of insulin ([place])   [place], Enobong, MD   2 years ago Hyperlipidemia, unspecified hyperlipidemia type   [place], Enobong, MD       Future Appointments             In 1 month Charlott Rakes, MD [place]

## 2021-07-13 ENCOUNTER — Ambulatory Visit: Payer: BC Managed Care – PPO | Admitting: Family Medicine

## 2021-07-23 ENCOUNTER — Other Ambulatory Visit: Payer: Self-pay | Admitting: Family Medicine

## 2021-07-23 DIAGNOSIS — E1149 Type 2 diabetes mellitus with other diabetic neurological complication: Secondary | ICD-10-CM

## 2021-07-23 DIAGNOSIS — Z794 Long term (current) use of insulin: Secondary | ICD-10-CM

## 2021-07-23 NOTE — Telephone Encounter (Signed)
Requested medication (s) are due for refill today: yes  Requested medication (s) are on the active medication list: yes  Last refill:  05/13/21 #1.5 ml  1 RF  Future visit scheduled: yes  Notes to clinic:  overdue lab work routing back to office as per protocol   Requested Prescriptions  Pending Prescriptions Disp Refills   OZEMPIC, 0.25 OR 0.5 MG/DOSE, 2 MG/1.5ML SOPN [Pharmacy Med Name: OZEMPIC 0.25 OR 0.5MG /DOS 1X2MG  PEN] 1.5 mL 1    Sig: INJECT 0.5 MG UNDER SKIN ONCE WEEKLY     Endocrinology:  Diabetes - GLP-1 Receptor Agonists Failed - 07/23/2021  9:21 AM      Failed - HBA1C is between 0 and 7.9 and within 180 days    HbA1c, POC (controlled diabetic range)  Date Value Ref Range Status  12/05/2020 13.2 (A) 0.0 - 7.0 % Final          Passed - Valid encounter within last 6 months    Recent Outpatient Visits           5 months ago Type 2 diabetes mellitus with other neurologic complication, with long-term current use of insulin ([place])   [place], Annie Main L, RPH-CPP   6 months ago Type 2 diabetes mellitus with other neurologic complication, with long-term current use of insulin ([place])   [place], Annie Main L, RPH-CPP   7 months ago Type 2 diabetes mellitus with other neurologic complication, with long-term current use of insulin ([place])   [place], Ovando, MD   1 year ago Type 2 diabetes mellitus with other neurologic complication, with long-term current use of insulin ([place])   [place], Charlane Ferretti, MD   2 years ago Hyperlipidemia, unspecified hyperlipidemia type   [place], Enobong, MD       Future Appointments             In 1 week Charlott Rakes, MD [place]

## 2021-07-24 ENCOUNTER — Telehealth: Payer: Self-pay | Admitting: Family Medicine

## 2021-07-24 NOTE — Telephone Encounter (Signed)
Dr. Newlin on vacation 12/19. Left vm for patient to call [phone] to reschedule appt.  °

## 2021-07-31 ENCOUNTER — Ambulatory Visit: Payer: BC Managed Care – PPO | Admitting: Family Medicine

## 2021-08-04 ENCOUNTER — Other Ambulatory Visit: Payer: Self-pay | Admitting: Family Medicine

## 2021-08-04 DIAGNOSIS — Z794 Long term (current) use of insulin: Secondary | ICD-10-CM

## 2021-08-04 NOTE — Telephone Encounter (Signed)
Medication Refill - Medication: Semaglutide,0.25 or 0.5MG /DOS, (OZEMPIC, 0.25 OR 0.5 MG/DOSE,) 2 MG/1.5ML SOPN  Has the patient contacted their pharmacy? Yes.   They told her to call office Preferred Pharmacy (with phone number or street name): [id number] - [place], [address]  Has the patient been seen for an appointment in the last year OR does the patient have an upcoming appointment? Yes.    Pt has appt on 10/11/2021.  But is out of this insulin needs, enough to last until then.Marland Kitchen

## 2021-08-04 NOTE — Telephone Encounter (Signed)
Requested medication (s) are due for refill today:   Yes  Requested medication (s) are on the active medication list:   yes  Future visit scheduled:   Yes 10/11/2021 with [place]   Last ordered: 05/23/2021 1.5 ml, 1 refill  Returned for provider to review for refills prior to appt per note.   Requested Prescriptions  Pending Prescriptions Disp Refills   Semaglutide,0.25 or 0.5MG /DOS, (OZEMPIC, 0.25 OR 0.5 MG/DOSE,) 2 MG/1.5ML SOPN 1.5 mL 1    Sig: INJECT 0.5 MG UNDER THE SKIN ONCE A WEEK     Endocrinology:  Diabetes - GLP-1 Receptor Agonists Failed - 08/04/2021 11:13 AM      Failed - HBA1C is between 0 and 7.9 and within 180 days    HbA1c, POC (controlled diabetic range)  Date Value Ref Range Status  12/05/2020 13.2 (A) 0.0 - 7.0 % Final          Passed - Valid encounter within last 6 months    Recent Outpatient Visits           5 months ago Type 2 diabetes mellitus with other neurologic complication, with long-term current use of insulin ([place])   [place], Annie Main L, RPH-CPP   7 months ago Type 2 diabetes mellitus with other neurologic complication, with long-term current use of insulin [facility])   [place], Annie Main L, RPH-CPP   8 months ago Type 2 diabetes mellitus with other neurologic complication, with long-term current use of insulin ([place])   [place], [place], MD   1 year ago Type 2 diabetes mellitus with other neurologic complication, with long-term current use of insulin ([place])   [place], Charlane Ferretti, MD   2 years ago Hyperlipidemia, unspecified hyperlipidemia type   [place], [place], MD       Future Appointments             In 2 months Charlott Rakes, MD [place]

## 2021-08-08 MED ORDER — OZEMPIC (0.25 OR 0.5 MG/DOSE) 2 MG/1.5ML ~~LOC~~ SOPN: PEN_INJECTOR | SUBCUTANEOUS | 1 refills | Status: DC

## 2021-09-18 ENCOUNTER — Ambulatory Visit: Payer: BC Managed Care – PPO | Admitting: Family Medicine

## 2021-10-03 ENCOUNTER — Encounter (HOSPITAL_COMMUNITY): Payer: Self-pay | Admitting: Emergency Medicine

## 2021-10-03 ENCOUNTER — Ambulatory Visit (HOSPITAL_COMMUNITY)
Admission: EM | Admit: 2021-10-03 | Discharge: 2021-10-03 | Disposition: A | Discharge status: Home / Self Care | Payer: BC Managed Care – PPO | Attending: Student | Admitting: Student

## 2021-10-03 ENCOUNTER — Other Ambulatory Visit: Payer: Self-pay

## 2021-10-03 DIAGNOSIS — G8929 Other chronic pain: Secondary | ICD-10-CM | POA: Diagnosis not present

## 2021-10-03 DIAGNOSIS — M549 Dorsalgia, unspecified: Secondary | ICD-10-CM

## 2021-10-03 DIAGNOSIS — M5412 Radiculopathy, cervical region: Secondary | ICD-10-CM

## 2021-10-03 MED ORDER — METHYLPREDNISOLONE SODIUM SUCC 125 MG IJ SOLR
80.0000 mg | Freq: Once | INTRAMUSCULAR | Status: AC
  Administered 2021-10-03: 80 mg via INTRAMUSCULAR

## 2021-10-03 MED ORDER — METHYLPREDNISOLONE SODIUM SUCC 125 MG IJ SOLR
INTRAMUSCULAR | Status: AC
  Filled 2021-10-03: qty 2

## 2021-10-03 NOTE — Discharge Instructions (Addendum)
-  Continue muscle relaxers, heat, ice, etc -Follow-up with an orthopedist. I recommend [facility] at [address], [place], [place] [address]. You can schedule an appointment by calling [phone]) or online ([email or web address]), but they also have a walk-in clinic M-F 8a-8p and Sat 10a-3p.

## 2021-10-03 NOTE — ED Provider Notes (Signed)
[place]    CSN: [id number] Arrival date & time: 10/03/21  8338      History   Chief Complaint Chief Complaint  Patient presents with   Back Pain    HPI Krista Campbell is a 51 y.o. female presenting with chronic back pain.  Medical history diabetes, obesity.  Describes years of back pain.  Was evaluated by PCP 11/22, they tried ice, heat, Flexeril with temporary improvement.  States the pain worsened 1/23, and persists to this day.  States diffuse pain with every movement and also pain radiating down the right arm with some tingling in the fingers.  Denies trauma, but does endorse overuse as she lifts a mop bucket a lot.  States she has not been told to see an orthopedist or spine specialist in the past.  Denies pain shooting down legs, denies numbness in arms/legs, denies weakness in arms/legs, denies saddle anesthesia, denies bowel/bladder incontinence, denies urinary retention, denies constipation.   HPI  Past Medical History:  Diagnosis Date   Diabetes mellitus without complication ([place])    Hyperlipidemia     Patient Active Problem List   Diagnosis Date Noted   Obesity (BMI 35.0-39.9 without comorbidity) 03/24/2018   HLD (hyperlipidemia) 05/21/2014   Hypomagnesemia 05/21/2014   DKA (diabetic ketoacidoses) 05/19/2014   Hyponatremia 05/19/2014   Hypokalemia 05/19/2014   DM (diabetes mellitus) ([place]) 05/19/2014   Dehydration 05/19/2014    History reviewed. No pertinent surgical history.  OB History   No obstetric history on file.      Home Medications    Prior to Admission medications   Medication Sig Start Date End Date Taking? Authorizing Provider  atorvastatin (LIPITOR) 20 MG tablet TAKE 1 TABLET BY MOUTH DAILY AT 6 PM. 12/05/20   Charlott Rakes, MD  Bayer Microlet Lancets lancets Use to check blood sugar 3 times daily as directed. E11.9. Z79.4 07/31/19   Charlott Rakes, MD  Blood Glucose Monitoring Suppl (CONTOUR BLOOD GLUCOSE SYSTEM) DEVI  Use to check blood sugar 3 times daily as directed. E11.9. Z79.4 07/31/19   Charlott Rakes, MD  clotrimazole-betamethasone (LOTRISONE) cream Apply 1 application topically 2 (two) times daily. Patient not taking: Reported on 12/05/2020 03/24/18   Charlott Rakes, MD  cyclobenzaprine (FLEXERIL) 10 MG tablet Take 1 tablet (10 mg total) by mouth 3 (three) times daily as needed for muscle spasms. Patient not taking: Reported on 10/03/2021 12/05/20   Charlott Rakes, MD  DULoxetine (CYMBALTA) 60 MG capsule Take 1 capsule (60 mg total) by mouth daily. Patient not taking: Reported on 12/05/2020 09/08/19   Charlott Rakes, MD  glucose blood test strip Use to check blood sugar 3 times daily as directed. E11.9. Z79.4 07/31/19   Charlott Rakes, MD  insulin glargine-yfgn (SEMGLEE, YFGN,) 100 UNIT/ML Pen Inject 36 Units into the skin daily. 05/26/21   Charlott Rakes, MD  Insulin Pen Needle (TRUEPLUS PEN NEEDLES) 32G X 4 MM MISC USE AS DIRECTED 12/15/20   Charlott Rakes, MD  Insulin Syringe-Needle U-100 (BD INSULIN SYRINGE ULTRAFINE) 31G X 15/64" 0.5 ML MISC USE AS DIRECTED 10/04/16   Charlott Rakes, MD  Magnesium 400 MG TABS Take 400 mg by mouth 2 (two) times daily. Patient not taking: Reported on 10/03/2021 09/25/18   Charlott Rakes, MD  Magnesium 500 MG TABS Take by mouth. Patient not taking: Reported on 12/05/2020    [provider]  metroNIDAZOLE (METROGEL VAGINAL) 0.75 % vaginal gel Place 1 Applicatorful vaginally at bedtime. Patient not taking: Reported on 12/05/2020 09/25/17  Charlott Rakes, MD  naproxen (NAPROSYN) 500 MG tablet Take 1 tablet (500 mg total) by mouth 2 (two) times daily with a meal. 12/05/20   Charlott Rakes, MD  Semaglutide,0.25 or 0.5MG /DOS, (OZEMPIC, 0.25 OR 0.5 MG/DOSE,) 2 MG/1.5ML SOPN INJECT 0.5 MG UNDER THE SKIN ONCE A WEEK 08/08/21   Charlott Rakes, MD    Family History Family History  Problem Relation Age of Onset   Kidney disease Father     Social History Social  History   Tobacco Use   Smoking status: Never   Smokeless tobacco: Never  Vaping Use   Vaping Use: Never used  Substance Use Topics   Alcohol use: No   Drug use: No     Allergies   Metformin and related   Review of Systems Review of Systems  Musculoskeletal:  Positive for back pain.  All other systems reviewed and are negative.   Physical Exam Triage Vital Signs ED Triage Vitals  Enc Vitals Group     BP 10/03/21 0948 121/76     Pulse Rate 10/03/21 0948 93     Resp 10/03/21 0948 20     Temp 10/03/21 0948 98.2 F (36.8 C)     Temp Source 10/03/21 0948 Oral     SpO2 10/03/21 0948 96 %     Weight --      Height --      Head Circumference --      Peak Flow --      Pain Score 10/03/21 0944 8     Pain Loc --      Pain Edu? --      Excl. in [place]? --    No data found.  Updated Vital Signs BP 121/76 (BP Location: Right Arm)    Pulse 93    Temp 98.2 F (36.8 C) (Oral)    Resp 20    LMP 09/04/2021 (Approximate)    SpO2 96%   Visual Acuity Right Eye Distance:   Left Eye Distance:   Bilateral Distance:    Right Eye Near:   Left Eye Near:    Bilateral Near:     Physical Exam Vitals reviewed.  Constitutional:      General: She is not in acute distress.    Appearance: Normal appearance. She is not ill-appearing.  HENT:     Head: Normocephalic and atraumatic.  Cardiovascular:     Rate and Rhythm: Normal rate and regular rhythm.     Heart sounds: Normal heart sounds.  Pulmonary:     Effort: Pulmonary effort is normal.     Breath sounds: Normal breath sounds and air entry.  Abdominal:     Tenderness: There is no abdominal tenderness. There is no right CVA tenderness, left CVA tenderness, guarding or rebound.  Musculoskeletal:     Cervical back: Normal range of motion. No swelling, deformity, signs of trauma, rigidity, spasms, tenderness, bony tenderness or crepitus. No pain with movement.     Thoracic back: No swelling, deformity, signs of trauma, spasms,  tenderness or bony tenderness. Normal range of motion. No scoliosis.     Lumbar back: Spasms and tenderness present. No swelling, deformity, signs of trauma or bony tenderness. Normal range of motion. Negative right straight leg raise test and negative left straight leg raise test. No scoliosis.     Comments: Back is diffusely tender to palpation without point tenderness. Pain elicited with flexion and extension. Strength grossly intact upper and lower extremities, sensation intact. No midline spinous tenderness, deformity,  stepoff. There is some hypersensitivity R deltoid. Positive R spurling.   Absolutely no other injury, deformity, tenderness, ecchymosis, abrasion.  Neurological:     General: No focal deficit present.     Mental Status: She is alert.     Cranial Nerves: No cranial nerve deficit.  Psychiatric:        Mood and Affect: Mood normal.        Behavior: Behavior normal.        Thought Content: Thought content normal.        Judgment: Judgment normal.     UC Treatments / Results  Labs (all labs ordered are listed, but only abnormal results are displayed) Labs Reviewed - No data to display  EKG   Radiology No results found.  Procedures Procedures (including critical care time)  Medications Ordered in UC Medications  methylPREDNISolone sodium succinate (SOLU-MEDROL) 125 mg/2 mL injection 80 mg (has no administration in time range)    Initial Impression / Assessment and Plan / UC Course  I have reviewed the triage vital signs and the nursing notes.  Pertinent labs & imaging results that were available during my care of the patient were reviewed by me and considered in my medical decision making (see chart for details).     This patient is a very pleasant 51 y.o. year old female presenting with chronic back pain. No red flag symptoms.  Xray performed by PCP was normal per pt. Has failed conservative management with ice/heat, flexeril. Trial of prednisone today; she  prefers IM solumedrol to PO. For diabetes, sugars running 120s at home. Provided with ortho resources for further management.   Declines work note.   Red flag symptoms and ED return precautions discussed. Patient verbalizes understanding and agreement.    Final Clinical Impressions(s) / UC Diagnoses   Final diagnoses:  Cervical radiculitis  Chronic bilateral back pain, unspecified back location     Discharge Instructions      -Continue muscle relaxers, heat, ice, etc -Follow-up with an orthopedist. I recommend [facility] at [address], [place], [place] [address]. You can schedule an appointment by calling [phone]) or online ([email or web address]), but they also have a walk-in clinic M-F 8a-8p and Sat 10a-3p.    ED Prescriptions   None    PDMP not reviewed this encounter.   Hazel Sams, PA-C 10/03/21 1053

## 2021-10-03 NOTE — ED Triage Notes (Signed)
Chronic back pain .  In November 2022 was evaluated.  Ice/heat/cyclobenzaprine and felt better.  In January 2023, pain worsened.  Pain in lower back and right arm pain.

## 2021-10-11 ENCOUNTER — Encounter: Payer: Self-pay | Admitting: Family Medicine

## 2021-10-11 ENCOUNTER — Other Ambulatory Visit: Payer: Self-pay

## 2021-10-11 ENCOUNTER — Ambulatory Visit: Payer: BC Managed Care – PPO | Attending: Family Medicine | Admitting: Family Medicine

## 2021-10-11 VITALS — BP 111/74 | HR 84 | Ht 61.0 in | Wt 184.8 lb

## 2021-10-11 DIAGNOSIS — E785 Hyperlipidemia, unspecified: Secondary | ICD-10-CM

## 2021-10-11 DIAGNOSIS — E1149 Type 2 diabetes mellitus with other diabetic neurological complication: Secondary | ICD-10-CM | POA: Diagnosis not present

## 2021-10-11 DIAGNOSIS — M5412 Radiculopathy, cervical region: Secondary | ICD-10-CM

## 2021-10-11 DIAGNOSIS — Z1159 Encounter for screening for other viral diseases: Secondary | ICD-10-CM

## 2021-10-11 DIAGNOSIS — Z794 Long term (current) use of insulin: Secondary | ICD-10-CM

## 2021-10-11 LAB — POCT GLYCOSYLATED HEMOGLOBIN (HGB A1C): HbA1c, POC (controlled diabetic range): 6.1 % (ref 0.0–7.0)

## 2021-10-11 LAB — GLUCOSE, POCT (MANUAL RESULT ENTRY): POC Glucose: 146 mg/dl — AB (ref 70–99)

## 2021-10-11 MED ORDER — CYCLOBENZAPRINE HCL 10 MG PO TABS: 10.0000 mg | ORAL_TABLET | Freq: Three times a day (TID) | ORAL | 3 refills | Status: DC | PRN

## 2021-10-11 MED ORDER — INSULIN GLARGINE-YFGN 100 UNIT/ML ~~LOC~~ SOPN: 38.0000 [IU] | PEN_INJECTOR | Freq: Every day | SUBCUTANEOUS | 1 refills | Status: DC

## 2021-10-11 MED ORDER — NAPROXEN 500 MG PO TABS: 500.0000 mg | ORAL_TABLET | Freq: Two times a day (BID) | ORAL | 3 refills | Status: DC

## 2021-10-11 MED ORDER — ATORVASTATIN CALCIUM 20 MG PO TABS: ORAL_TABLET | ORAL | 6 refills | Status: DC

## 2021-10-11 MED ORDER — OZEMPIC (0.25 OR 0.5 MG/DOSE) 2 MG/1.5ML ~~LOC~~ SOPN: PEN_INJECTOR | SUBCUTANEOUS | 6 refills | Status: DC

## 2021-10-11 NOTE — Progress Notes (Signed)
Subjective:  Patient ID: Krista Campbell, female    DOB: February 03, 1971  Age: 51 y.o. MRN: [id number]  CC: Diabetes   HPI Krista Campbell is a 51 y.o. year old female with a history of type 2 diabetes mellitus (A1c 6.1), hyperlipidemia, obesity here for follow-up of chronic medical conditions.  Interval History: She endorses non adherence wit her cholesterol pills  For the last 1 year she has had lower back pain which radiates to her thoracic back radiating to her hands. Pain goes from thoracic back to b/l upper arms with associated tingling and numbness and symptoms are less on days when she works.  She had an ED visit for same where she received a cortisone injection with some improvement in symptoms but then symptoms returned.  She will be seeing Ortho soon for follow-up Pain at the moment is 4/10.  With regards to diabetes mellitus she is doing well with her A1c of 6.1 down from 13.2 previously.  She previously had neuropathy which has resolved. Denies visual concerns, no hypoglycemia. Eye exam comes up soon with ophthalmology. Past Medical History:  Diagnosis Date   Diabetes mellitus without complication ([place])    Hyperlipidemia     History reviewed. No pertinent surgical history.  Family History  Problem Relation Age of Onset   Kidney disease Father     Allergies  Allergen Reactions   Metformin And Related Diarrhea    Outpatient Medications Prior to Visit  Medication Sig Dispense Refill   Bayer Microlet Lancets lancets Use to check blood sugar 3 times daily as directed. E11.9. Z79.4 100 each 11   Blood Glucose Monitoring Suppl (CONTOUR BLOOD GLUCOSE SYSTEM) DEVI Use to check blood sugar 3 times daily as directed. E11.9. Z79.4 1 each 0   clotrimazole-betamethasone (LOTRISONE) cream Apply 1 application topically 2 (two) times daily. 30 g 1   glucose blood test strip Use to check blood sugar 3 times daily as directed. E11.9. Z79.4 100 each 12   Insulin Pen Needle  (TRUEPLUS PEN NEEDLES) 32G X 4 MM MISC USE AS DIRECTED 100 each 11   Insulin Syringe-Needle U-100 (BD INSULIN SYRINGE ULTRAFINE) 31G X 15/64" 0.5 ML MISC USE AS DIRECTED 100 each 12   Magnesium 400 MG TABS Take 400 mg by mouth 2 (two) times daily. 60 tablet 2   Magnesium 500 MG TABS Take by mouth.     metroNIDAZOLE (METROGEL VAGINAL) 0.75 % vaginal gel Place 1 Applicatorful vaginally at bedtime. 70 g 0   atorvastatin (LIPITOR) 20 MG tablet TAKE 1 TABLET BY MOUTH DAILY AT 6 PM. 30 tablet 6   cyclobenzaprine (FLEXERIL) 10 MG tablet Take 1 tablet (10 mg total) by mouth 3 (three) times daily as needed for muscle spasms. 60 tablet 3   DULoxetine (CYMBALTA) 60 MG capsule Take 1 capsule (60 mg total) by mouth daily. 30 capsule 3   insulin glargine-yfgn (SEMGLEE, YFGN,) 100 UNIT/ML Pen Inject 36 Units into the skin daily. 15 mL 1   naproxen (NAPROSYN) 500 MG tablet Take 1 tablet (500 mg total) by mouth 2 (two) times daily with a meal. 30 tablet 3   Semaglutide,0.25 or 0.5MG/DOS, (OZEMPIC, 0.25 OR 0.5 MG/DOSE,) 2 MG/1.5ML SOPN INJECT 0.5 MG UNDER THE SKIN ONCE A WEEK 1.5 mL 1   No facility-administered medications prior to visit.     ROS Review of Systems  Constitutional:  Negative for activity change, appetite change and fatigue.  HENT:  Negative for congestion, sinus pressure and sore throat.  Eyes:  Negative for visual disturbance.  Respiratory:  Negative for cough, chest tightness, shortness of breath and wheezing.   Cardiovascular:  Negative for chest pain and palpitations.  Gastrointestinal:  Negative for abdominal distention, abdominal pain and constipation.  Endocrine: Negative for polydipsia.  Genitourinary:  Negative for dysuria and frequency.  Musculoskeletal:        See HPI  Skin:  Negative for rash.  Neurological:  Negative for tremors, light-headedness and numbness.  Hematological:  Does not bruise/bleed easily.  Psychiatric/Behavioral:  Negative for agitation and behavioral  problems.    Objective:  BP 111/74    Pulse 84    Ht '5\' 1"'  (1.549 m)    Wt 184 lb 12.8 oz (83.8 kg)    SpO2 100%    BMI 34.92 kg/m   BP/Weight 10/11/2021 10/03/2021 5/36/1443  Systolic BP 154 008 676  Diastolic BP 74 76 80  Wt. (Lbs) 184.8 - 181.4  BMI 34.92 - 32.13      Physical Exam Constitutional:      Appearance: She is well-developed.  Cardiovascular:     Rate and Rhythm: Normal rate.     Heart sounds: Normal heart sounds. No murmur heard. Pulmonary:     Effort: Pulmonary effort is normal.     Breath sounds: Normal breath sounds. No wheezing or rales.  Chest:     Chest wall: No tenderness.  Abdominal:     General: Bowel sounds are normal. There is no distension.     Palpations: Abdomen is soft. There is no mass.     Tenderness: There is no abdominal tenderness.  Musculoskeletal:     Right lower leg: No edema.     Left lower leg: No edema.     Comments: No tenderness on palpation of entire spine Positive Hawkins sign bilaterally, negative Neer sign Normal handgrip.  Neurological:     Mental Status: She is alert and oriented to person, place, and time.  Psychiatric:        Mood and Affect: Mood normal.    CMP Latest Ref Rng & Units 12/05/2020 08/26/2019 09/24/2018  Glucose 65 - 99 mg/dL 229(H) 140(H) 391(H)  BUN 6 - 24 mg/dL '9 9 11  ' Creatinine 0.57 - 1.00 mg/dL 0.93 0.88 0.98  Sodium 134 - 144 mmol/L 140 138 136  Potassium 3.5 - 5.2 mmol/L 4.7 3.6 4.0  Chloride 96 - 106 mmol/L 97 97 98  CO2 20 - 29 mmol/L '26 27 23  ' Calcium 8.7 - 10.2 mg/dL 10.0 9.7 9.1  Total Protein 6.0 - 8.5 g/dL 7.8 7.2 -  Total Bilirubin 0.0 - 1.2 mg/dL 0.5 0.7 -  Alkaline Phos 44 - 121 IU/L 157(H) 92 -  AST 0 - 40 IU/L 41(H) 19 -  ALT 0 - 32 IU/L 78(H) 15 -    Lipid Panel     Component Value Date/Time   CHOL 282 (H) 12/05/2020 1202   TRIG 117 12/05/2020 1202   HDL 74 12/05/2020 1202   CHOLHDL 3.8 12/05/2020 1202   CHOLHDL 2.2 10/19/2015 1001   VLDL 9 10/19/2015 1001   LDLCALC 188  (H) 12/05/2020 1202    CBC    Component Value Date/Time   WBC 4.9 10/19/2015 1001   RBC 4.17 10/19/2015 1001   HGB 11.8 (L) 10/19/2015 1001   HCT 36.7 10/19/2015 1001   PLT 320 10/19/2015 1001   MCV 88.0 10/19/2015 1001   MCH 28.3 10/19/2015 1001   MCHC 32.2 10/19/2015 1001   RDW  14.2 10/19/2015 1001   LYMPHSABS 2.3 05/19/2014 1155   MONOABS 0.7 05/19/2014 1155   EOSABS 0.0 05/19/2014 1155   BASOSABS 0.0 05/19/2014 1155    Lab Results  Component Value Date   HGBA1C 6.1 10/11/2021    Assessment & Plan:  1. Type 2 diabetes mellitus with other neurologic complication, with long-term current use of insulin (HCC) Controlled with A1c of 6.2 which has improved from 13.2 previously Goal is less than 7.0 Commended on improvement Continue current regimen Counseled on Diabetic diet, my plate method, 527 minutes of moderate intensity exercise/week Blood sugar logs with fasting goals of 80-120 mg/dl, random of less than 180 and in the event of sugars less than 60 mg/dl or greater than 400 mg/dl encouraged to notify the clinic. Advised on the need for annual eye exams, annual foot exams, Pneumonia vaccine. - POCT glucose (manual entry) - POCT glycosylated hemoglobin (Hb A1C) - Microalbumin / creatinine urine ratio; Future - LP+Non-HDL Cholesterol; Future - CMP14+EGFR; Future - CBC with Differential/Platelet; Future - Semaglutide,0.25 or 0.5MG/DOS, (OZEMPIC, 0.25 OR 0.5 MG/DOSE,) 2 MG/1.5ML SOPN; INJECT 0.5 MG UNDER THE SKIN ONCE A WEEK  Dispense: 1.5 mL; Refill: 6 - insulin glargine-yfgn (SEMGLEE, YFGN,) 100 UNIT/ML Pen; Inject 38 Units into the skin daily.  Dispense: 15 mL; Refill: 1  2. Hyperlipidemia, unspecified hyperlipidemia type If lipid panel is abnormal I will make no regimen changes given she has been nonadherent with her lipid lowering medication Low-cholesterol diet - atorvastatin (LIPITOR) 20 MG tablet; TAKE 1 TABLET BY MOUTH DAILY AT 6 PM.  Dispense: 30 tablet; Refill:  6  3. Cervical radiculopathy Uncontrolled Will refer to PT - Ambulatory referral to Physical Therapy - naproxen (NAPROSYN) 500 MG tablet; Take 1 tablet (500 mg total) by mouth 2 (two) times daily with a meal.  Dispense: 30 tablet; Refill: 3 - cyclobenzaprine (FLEXERIL) 10 MG tablet; Take 1 tablet (10 mg total) by mouth 3 (three) times daily as needed for muscle spasms.  Dispense: 60 tablet; Refill: 3  4. Screening for viral disease - HCV Ab w Reflex to Quant PCR; Future   Meds ordered this encounter  Medications   Semaglutide,0.25 or 0.5MG/DOS, (OZEMPIC, 0.25 OR 0.5 MG/DOSE,) 2 MG/1.5ML SOPN    Sig: INJECT 0.5 MG UNDER THE SKIN ONCE A WEEK    Dispense:  1.5 mL    Refill:  6   insulin glargine-yfgn (SEMGLEE, YFGN,) 100 UNIT/ML Pen    Sig: Inject 38 Units into the skin daily.    Dispense:  15 mL    Refill:  1    ZERO refills remain on this prescription. Your patient is requesting advance approval of refills for this medication to [facility]   atorvastatin (LIPITOR) 20 MG tablet    Sig: TAKE 1 TABLET BY MOUTH DAILY AT 6 PM.    Dispense:  30 tablet    Refill:  6   naproxen (NAPROSYN) 500 MG tablet    Sig: Take 1 tablet (500 mg total) by mouth 2 (two) times daily with a meal.    Dispense:  30 tablet    Refill:  3   cyclobenzaprine (FLEXERIL) 10 MG tablet    Sig: Take 1 tablet (10 mg total) by mouth 3 (three) times daily as needed for muscle spasms.    Dispense:  60 tablet    Refill:  3    Follow-up: Return in about 1 month (around 11/11/2021) for Complete physical exam.  Charlott Rakes, MD, FAAFP. [facility] and [place], [place]   10/11/2021, 5:08 PM

## 2021-10-11 NOTE — Patient Instructions (Signed)
Cervical Radiculopathy ?Cervical radiculopathy means that a nerve in the neck (a cervical nerve) is pinched or bruised. This can happen because of an injury to the cervical spine (vertebrae) in the neck, or as a normal part of getting older. This condition can cause pain or loss of feeling (numbness) that runs from your neck all the way down to your arm and fingers. Often, this condition gets better with rest. Treatment may be needed if the condition does not get better. ?What are the causes? ?A neck injury. ?A bulging disk in your spine. ?Sudden muscle tightening (muscle spasms). ?Tight muscles in your neck due to overuse. ?Arthritis. ?Breakdown in the bones and joints of the spine (spondylosis) due to getting older. ?Bone spurs that form near the nerves in the neck. ?What are the signs or symptoms? ?Pain. The pain may: ?Run from the neck to the arm and hand. ?Be very bad or irritating. ?Get worse when you move your neck. ?Loss of feeling or tingling in your arm or hand. ?Weakness in your arm or hand, in very bad cases. ?How is this treated? ?In many cases, treatment is not needed for this condition. With rest, the condition often gets better over time. If treatment is needed, options may include: ?Wearing a soft neck collar (cervical collar) for short periods of time. ?Doing exercises (physical therapy) to strengthen your neck muscles. ?Taking medicines. ?Having shots (injections) in your spine, in very bad cases. ?Having surgery. This may be needed if other treatments do not help. The type of surgery that is used will depend on the cause of your condition. ?Follow these instructions at home: ?If you have a soft neck collar: ?Wear it as told by your doctor. Take it off only as told by your doctor. ?Ask your doctor if you can take the collar off for cleaning and bathing. If you are allowed to take the collar off for cleaning or bathing: ?Follow instructions from your doctor about how to take off the collar  safely. ?Clean the collar by wiping it with mild soap and water and drying it completely. ?Take out any removable pads in the collar every 1-2 days. Wash them by hand with soap and water. Let them air-dry completely before you put them back in the collar. ?Check your skin under the collar for redness or sores. If you see any, tell your doctor. ?Managing pain ?  ?Take over-the-counter and prescription medicines only as told by your doctor. ?If told, put ice on the painful area. To do this: ?If you have a soft neck collar, take if off as told by your doctor. ?Put ice in a plastic bag. ?Place a towel between your skin and the bag. ?Leave the ice on for 20 minutes, 2-3 times a day. ?Take off the ice if your skin turns bright red. This is very important. If you cannot feel pain, heat, or cold, you have a greater risk of damage to the area. ?If using ice does not help, you can try using heat. Use the heat source that your doctor recommends, such as a moist heat pack or a heating pad. ?Place a towel between your skin and the heat source. ?Leave the heat on for 20-30 minutes. ?Take off the heat if your skin turns bright red. This is very important. If you cannot feel pain, heat, or cold, you have a greater risk of getting burned. ?You may try a gentle neck and shoulder rub (massage). ?Activity ?Rest as needed. ?Return to your normal   activities when your doctor says that it is safe. ?Do exercises as told by your doctor or physical therapist. ?You may have to avoid lifting. Ask your doctor how much you can safely lift. ?General instructions ?Use a flat pillow when you sleep. ?Do not drive while wearing a soft neck collar. If you do not have a soft neck collar, ask your doctor if it is safe to drive while your neck heals. ?Ask your doctor if you should avoid driving or using machines while you are taking your medicine. ?Do not smoke or use any products that contain nicotine or tobacco. If you need help quitting, ask your  doctor. ?Keep all follow-up visits. ?Contact a doctor if: ?Your condition does not get better with treatment. ?Get help right away if: ?Your pain gets worse and medicine does not help. ?You lose feeling or feel weak in your hand, arm, face, or leg. ?You have a high fever. ?Your neck is stiff. ?You cannot control when you poop or pee (have incontinence). ?You have trouble with walking, balance, or talking. ?Summary ?Cervical radiculopathy means that a nerve in the neck is pinched or bruised. ?A nerve can get pinched from a bulging disk, arthritis, an injury to the neck, or other causes. ?Symptoms include pain, tingling, or loss of feeling that goes from the neck to the arm or hand. ?Weakness in your arm or hand can happen in very bad cases. ?Treatment may include resting, wearing a soft neck collar, and doing exercises. You might need to take medicines for pain. In very bad cases, shots or surgery may be needed. ?This information is not intended to replace advice given to you by your health care provider. Make sure you discuss any questions you have with your health care provider. ?Document Revised: 02/02/2021 Document Reviewed: 02/02/2021 ?Elsevier Patient Education ? 2022 Elsevier Inc. ? ?

## 2021-10-16 ENCOUNTER — Other Ambulatory Visit: Payer: Self-pay

## 2021-10-16 ENCOUNTER — Ambulatory Visit: Payer: BC Managed Care – PPO | Attending: Family Medicine

## 2021-10-16 ENCOUNTER — Other Ambulatory Visit: Payer: BC Managed Care – PPO

## 2021-10-16 DIAGNOSIS — Z1159 Encounter for screening for other viral diseases: Secondary | ICD-10-CM | POA: Diagnosis not present

## 2021-10-16 DIAGNOSIS — E1149 Type 2 diabetes mellitus with other diabetic neurological complication: Secondary | ICD-10-CM | POA: Diagnosis not present

## 2021-10-16 DIAGNOSIS — Z794 Long term (current) use of insulin: Secondary | ICD-10-CM | POA: Diagnosis not present

## 2021-10-16 NOTE — Therapy (Signed)
OUTPATIENT PHYSICAL THERAPY EVALUATION   Patient Name: Krista Campbell MRN: [id number] DOB:October 15, 1970, 51 y.o., female Today's Date: 10/18/2021   PT End of Session - 10/18/21 0741     Visit Number 1    Number of Visits 8    Date for PT Re-Evaluation 12/13/21    Authorization Type [employer]    PT Start Time 0745    PT Stop Time 0830    PT Time Calculation (min) 45 min    Activity Tolerance Patient tolerated treatment well    Behavior During Therapy [facility] for tasks assessed/performed             Past Medical History:  Diagnosis Date   Diabetes mellitus without complication ([place])    Hyperlipidemia    History reviewed. No pertinent surgical history. Patient Active Problem List   Diagnosis Date Noted   Obesity (BMI 35.0-39.9 without comorbidity) 03/24/2018   HLD (hyperlipidemia) 05/21/2014   Hypomagnesemia 05/21/2014   DKA (diabetic ketoacidoses) 05/19/2014   Hyponatremia 05/19/2014   Hypokalemia 05/19/2014   DM (diabetes mellitus) ([place]) 05/19/2014   Dehydration 05/19/2014    PCP: Charlott Rakes, MD  REFERRING PROVIDER: Charlott Rakes, MD  REFERRING DIAG: Cervical radiculopathy  THERAPY DIAG:  Chronic bilateral low back pain, unspecified whether sciatica present  Cervicalgia  Pain in right arm  Muscle weakness (generalized)  Abnormal posture  ONSET DATE: "for about a year"  SUBJECTIVE:        SUBJECTIVE STATEMENT: Patient states about a year ago she started having upper back pain between shoulder blades, she was told she was tense and to use heat/cold. Over the past 5-6 months she started having pain shooting from upper back to the lower back, then pain down the right arm with numbness and tingling. She does have periodic neck pain. Currently, she is having stiffness in the lower back, pain with numbness and tingling in the right arm, and then yesterday she started feeling the numbness and tingling in the left arm. Lower back tends to be more aggravated with  inactivity, after sleeping and laying down. The her right arm will hurt with working. Patient also reports sometimes sleeping on right side will be painful.  PERTINENT HISTORY:  DM  PAIN:  Are you having pain? Yes NPRS scale: 6/10 Pain location: Low back, right shoulder/arm Pain orientation: Bilateral PAIN TYPE: Chronic Pain description: Constant, tight Aggravating factors: Inactivity, lying down, sitting extended periods Relieving factors: Medication  PRECAUTIONS: None  WEIGHT BEARING RESTRICTIONS No  FALLS:  Has patient fallen in last 6 months? No  LIVING ENVIRONMENT: Lives with: lives with their family  OCCUPATION: Works overnight at [employer], sits most of the night  PLOF: Independent  PATIENT GOALS: Pain relief so can do all things around home and work without hurting   OBJECTIVE:  DIAGNOSTIC FINDINGS:  N/A  PATIENT SURVEYS:  FOTO 68% functional status (neck)  COGNITION: Overall cognitive status: Within functional limits for tasks assessed   SENSATION: Light touch: Appears intact  POSTURE:  Rounded shoulder and forward head posture  PALPATION: Tender to palpation with trigger points noted right upper trap region, infraspinatus   CERVICAL AROM:  AROM AROM (deg) 10/18/2021  Flexion 60  Extension 55  Right lateral flexion 40  Left lateral flexion 35  Right rotation 75  Left rotation 75*  Patient reports pulling right upper trap with left side bend and bilateral rotation  LUMBAR AROM:  A/PROM AROM  10/18/2021  Flexion WFL  Extension WFL  Right lateral  flexion WFL  Left lateral flexion WFL  Right rotation [facility]  Left rotation       [facility]  Patient reports lumbar sitffness/pain with flexion, side bend, and rotation  UE AROM:  AROM Right 10/18/2021 Left 10/18/2021  Shoulder flexion 110 175  Shoulder internal rotation T6 T6  Shoulder external rotation T2 T4  Patient limited on right due to pain   UE MMT:  MMT Right 10/18/2021  Left 10/18/2021  Shoulder flexion 4 5  Shoulder extension 4 5  Shoulder abduction 4 5  Shoulder internal rotation 4+ 5  Shoulder external rotation 4 5  Periscapular musculature 4- 4-  Elbow flexion 5 5  Elbow extension 5 5  Grip strength 50 52  Patient reports right shoulder pain with all right sided muscle strength testing  Patient with upper trap compensation with periscapular muscle testing   CERVICAL SPECIAL TESTS:  Cervical radicular testing negative for right UE symptoms  TODAY'S TREATMENT:  LTR 5 x 5 sec Supine posterior pelvic tilt 5 x 5 sec Seated upper trap and levator scap stretch x 30 sec Row with red x 10 Double er and scap retraction with red x 10  PATIENT EDUCATION:  Education details: Exam findings, POC, HEP Person educated: Patient Education method: Explanation, Demonstration, Tactile cues, Verbal cues, and Handouts Education comprehension: verbalized understanding, returned demonstration, verbal cues required, tactile cues required, and needs further education  HOME EXERCISE PROGRAM: Access Code: [email or web address]   ASSESSMENT: CLINICAL IMPRESSION: Patient is a 51 y.o. female who was seen today for physical therapy evaluation and treatment for chronic lumbar, thoracic, and cervical spine pain, and what seems most consistent with right rotator cuff tendinopathy rather than cervical radiculopathy. All radicular testing negative and patient demonstrates cervical motion grossly WFL without any reproduction of concordant symptoms. She does have pain with right shoulder motion and strength testing consistent with rotator cuff related pain. Her low back pain seems musculoskeletal in nature and her overall symptoms are likely being exacerbated by prolonged periods of poor posture.   OBJECTIVE IMPAIRMENTS decreased activity tolerance, decreased ROM, decreased strength, postural dysfunction, and pain.   ACTIVITY LIMITATIONS cleaning, community activity, occupation, and  shopping.   PERSONAL FACTORS Fitness, Past/current experiences, and Time since onset of injury/illness/exacerbation are also affecting patient's functional outcome.    REHAB POTENTIAL: Good  CLINICAL DECISION MAKING: Stable/uncomplicated  EVALUATION COMPLEXITY: Low   GOALS: Goals reviewed with patient? Yes  SHORT TERM GOALS:  Patient will be I with initial HEP in order to progress with therapy. Baseline: provided at eval Target date: 11/15/2021 Goal status: INITIAL  2.  PT will review FOTO with patient by 3rd visit in order to understand expected progress and outcome with therapy. Baseline: assessed at eval Target date: 11/15/2021 Goal status: INITIAL  3.  Patient will report pain level </= 3/10 in order to reduce functional limitations. Baseline: 6/10 Target date: 11/15/2021 Goal status: INITIAL  LONG TERM GOALS:  Patient will be I with final HEP to maintain progress from PT. Baseline: provided at eval Target date: 12/13/2021 Goal status: INITIAL  2.  Patient will report >/= 70% status on FOTO to indicate improved functional ability. Baseline: 68% functional status  Target date: 12/13/2021 Goal status: INITIAL  3.  Patient will demonstrate right shoulder elevation >/= 160 deg in order to improve ability to use right arm for household tasks and reaching into overhead cabinets. Baseline: 110 deg Target date: 12/13/2021 Goal status: INITIAL  4.  Patient will report no increased pain  or stiffness with lumbar AROM in order to reduce functional limitations with household tasks Baseline: patient reports stiffness/pain with lumbar flexion, side bend, and rotation Target date: 12/13/2021 Goal status: INITIAL  5.  Patient will report no limitation with sitting tolerance due to shoulder pain in order to improve ability to perform work related tasks Baseline: patient reports limitations with sitting due to right shoulder pain Target date: 12/13/2021 Goal status: INITIAL   PLAN: PT  FREQUENCY: 1x/week  PT DURATION: 8 weeks  PLANNED INTERVENTIONS: Therapeutic exercises, Therapeutic activity, Neuromuscular re-education, Balance training, Gait training, Patient/Family education, Joint manipulation, Joint mobilization, Aquatic Therapy, Dry Needling, Electrical stimulation, Spinal manipulation, Spinal mobilization, Cryotherapy, Moist heat, Taping, and Manual therapy  PLAN FOR NEXT SESSION: Review HEP and progress PRN, manual/dry needling for right upper trap/infra and lumbar region as needed, core and postural strengthening, rotator cuff strengthening   Hilda Blades, PT, DPT, LAT, ATC 10/18/21  1:43 PM Phone: [phone] Fax: [phone]

## 2021-10-17 ENCOUNTER — Other Ambulatory Visit: Payer: Self-pay

## 2021-10-17 LAB — CBC WITH DIFFERENTIAL/PLATELET
Basophils Absolute: 0 10*3/uL (ref 0.0–0.2)
Basos: 1 %
EOS (ABSOLUTE): 0.2 10*3/uL (ref 0.0–0.4)
Eos: 3 %
Hematocrit: 35.5 % (ref 34.0–46.6)
Hemoglobin: 11.3 g/dL (ref 11.1–15.9)
Immature Grans (Abs): 0 10*3/uL (ref 0.0–0.1)
Immature Granulocytes: 0 %
Lymphocytes Absolute: 3.1 10*3/uL (ref 0.7–3.1)
Lymphs: 41 %
MCH: 27.9 pg (ref 26.6–33.0)
MCHC: 31.8 g/dL (ref 31.5–35.7)
MCV: 88 fL (ref 79–97)
Monocytes Absolute: 0.7 10*3/uL (ref 0.1–0.9)
Monocytes: 10 %
Neutrophils Absolute: 3.4 10*3/uL (ref 1.4–7.0)
Neutrophils: 45 %
Platelets: 286 10*3/uL (ref 150–450)
RBC: 4.05 x10E6/uL (ref 3.77–5.28)
RDW: 13.5 % (ref 11.7–15.4)
WBC: 7.5 10*3/uL (ref 3.4–10.8)

## 2021-10-17 LAB — CMP14+EGFR
ALT: 25 IU/L (ref 0–32)
AST: 29 IU/L (ref 0–40)
Albumin/Globulin Ratio: 1.6 (ref 1.2–2.2)
Albumin: 4.5 g/dL (ref 3.8–4.8)
Alkaline Phosphatase: 141 IU/L — ABNORMAL HIGH (ref 44–121)
BUN/Creatinine Ratio: 14 (ref 9–23)
BUN: 13 mg/dL (ref 6–24)
Bilirubin Total: 0.5 mg/dL (ref 0.0–1.2)
CO2: 24 mmol/L (ref 20–29)
Calcium: 9.8 mg/dL (ref 8.7–10.2)
Chloride: 98 mmol/L (ref 96–106)
Creatinine, Ser: 0.93 mg/dL (ref 0.57–1.00)
Globulin, Total: 2.8 g/dL (ref 1.5–4.5)
Glucose: 65 mg/dL — ABNORMAL LOW (ref 70–99)
Potassium: 3.9 mmol/L (ref 3.5–5.2)
Sodium: 136 mmol/L (ref 134–144)
Total Protein: 7.3 g/dL (ref 6.0–8.5)
eGFR: 75 mL/min/{1.73_m2} (ref >59)

## 2021-10-17 LAB — LP+NON-HDL CHOLESTEROL
Cholesterol, Total: 203 mg/dL — ABNORMAL HIGH (ref 100–199)
HDL: 69 mg/dL (ref >39)
LDL Chol Calc (NIH): 107 mg/dL — ABNORMAL HIGH (ref 0–99)
Total Non-HDL-Chol (LDL+VLDL): 134 mg/dL — ABNORMAL HIGH (ref 0–129)
Triglycerides: 156 mg/dL — ABNORMAL HIGH (ref 0–149)
VLDL Cholesterol Cal: 27 mg/dL (ref 5–40)

## 2021-10-17 LAB — MICROALBUMIN / CREATININE URINE RATIO
Creatinine, Urine: 87 mg/dL
Microalb/Creat Ratio: 14 mg/g creat (ref 0–29)
Microalbumin, Urine: 11.9 ug/mL

## 2021-10-18 ENCOUNTER — Encounter: Payer: Self-pay | Admitting: Physical Therapy

## 2021-10-18 ENCOUNTER — Ambulatory Visit: Payer: BC Managed Care – PPO | Attending: Family Medicine | Admitting: Physical Therapy

## 2021-10-18 ENCOUNTER — Other Ambulatory Visit: Payer: Self-pay

## 2021-10-18 DIAGNOSIS — R293 Abnormal posture: Secondary | ICD-10-CM | POA: Insufficient documentation

## 2021-10-18 DIAGNOSIS — M542 Cervicalgia: Secondary | ICD-10-CM | POA: Diagnosis not present

## 2021-10-18 DIAGNOSIS — M79601 Pain in right arm: Secondary | ICD-10-CM | POA: Diagnosis not present

## 2021-10-18 DIAGNOSIS — M5412 Radiculopathy, cervical region: Secondary | ICD-10-CM | POA: Insufficient documentation

## 2021-10-18 DIAGNOSIS — M6281 Muscle weakness (generalized): Secondary | ICD-10-CM | POA: Insufficient documentation

## 2021-10-18 DIAGNOSIS — G8929 Other chronic pain: Secondary | ICD-10-CM | POA: Diagnosis not present

## 2021-10-18 DIAGNOSIS — M545 Low back pain, unspecified: Secondary | ICD-10-CM | POA: Diagnosis not present

## 2021-10-18 NOTE — Patient Instructions (Signed)
Access Code: [email or web address] ?URL: [email or web address] ?Date: 10/18/2021 ?Prepared by: Hilda Blades ? ?Exercises ?Supine Lower Trunk Rotation - 1-2 x daily - 7 x weekly - 10 reps - 5 seconds hold ?Supine Posterior Pelvic Tilt - 1-2 x daily - 10 reps - 5 seconds hold ?Seated Cervical Sidebending Stretch - 1-2 x daily - 7 x weekly - 3 reps - 20 seconds hold ?Seated Levator Scapulae Stretch - 1-2 x daily - 7 x weekly - 3 reps - 20 seconds hold ?Standing Row with Anchored Resistance - 1-2 x daily - 7 x weekly - 2 sets - 10 reps ?Shoulder External Rotation and Scapular Retraction with Resistance - 1-2 x daily - 7 x weekly - 2 sets - 10 reps ? ?

## 2021-10-19 ENCOUNTER — Other Ambulatory Visit: Payer: Self-pay | Admitting: Family Medicine

## 2021-10-19 DIAGNOSIS — Z794 Long term (current) use of insulin: Secondary | ICD-10-CM

## 2021-10-19 NOTE — Telephone Encounter (Signed)
Attempted twice to speak to pharmacy regarding verification rx was received. Unable to reach. Pt should have RF remaining. RX was sent to pharmacy on 10/11/21 #1.6m/6RF.   ? ?Requested Prescriptions  ?Pending Prescriptions Disp Refills  ?? Semaglutide,0.25 or 0.'5MG'$ /DOS, (OZEMPIC, 0.25 OR 0.5 MG/DOSE,) 2 MG/1.5ML SOPN [Pharmacy Med Name: OZEMPIC 0.25 OR 0.'5MG'$ /DOS 1X'2MG'$  PEN] 1.5 mL 6  ?  Sig: INJECT 0.25 MG INTO THE SKIN ONCE A WEEK THEN INCREASE TO 0.5 MG AFTER 1 MONTH  ?  ? Endocrinology:  Diabetes - GLP-1 Receptor Agonists - semaglutide Passed - 10/19/2021  8:01 AM  ?  ?  Passed - HBA1C in normal range and within 180 days  ?  HbA1c, POC (controlled diabetic range)  ?Date Value Ref Range Status  ?10/11/2021 6.1 0.0 - 7.0 % Final  ?   ?  ?  Passed - Cr in normal range and within 360 days  ?  Creat  ?Date Value Ref Range Status  ?06/28/2016 0.79 0.50 - 1.10 mg/dL Final  ? ?Creatinine, Ser  ?Date Value Ref Range Status  ?10/16/2021 0.93 0.57 - 1.00 mg/dL Final  ? ?Creatinine, Urine  ?Date Value Ref Range Status  ?06/28/2016 83 20 - 320 mg/dL Final  ?   ?  ?  Passed - Valid encounter within last 6 months  ?  Recent Outpatient Visits   ?      ? 1 week ago Type 2 diabetes mellitus with other neurologic complication, with long-term current use of insulin ([place]  ? [place]ane Ferretti [place]  ? 8 months ago Type 2 diabetes mellitus with other neurologic complication, with long-term current use of insulin ([place]  ? [place] [facility]  ? 9 months ago Type 2 diabetes mellitus with other neurologic complication, with long-term current use of insulin ([place]  ? [place] [facility]  ? 10 months ago Type 2 diabetes mellitus with other neurologic complication, with long-term current use of insulin ([place]  ? [place] [facility] [place] [facility] Ferretti [facility]  ? 2 years ago Type 2 diabetes  mellitus with other neurologic complication, with long-term current use of insulin ([place]  ? [place]  ?  ?  ?Future Appointments   ?        ? In 1 month NCharlott Rakes MD [place] ?  ? ?  ?  ?  ? ? ?

## 2021-10-19 NOTE — Telephone Encounter (Signed)
[place] called but d/t long hold time disconnected call and will try again after 2:30. RX was sent to pharmacy on 10/11/21 #1.42m/6RF.  ?

## 2021-10-19 NOTE — Telephone Encounter (Addendum)
2nd attempt: [place] called but d/t long hold time disconnected call. RX was sent to pharmacy on 10/11/21 #1.60m/6RF.  ?

## 2021-10-23 NOTE — Therapy (Incomplete)
OUTPATIENT PHYSICAL THERAPY TREATMENT NOTE   Patient Name: Krista Campbell MRN: [id number] DOB:Jun 25, 1971, 51 y.o., female Today's Date: 10/23/2021  PCP: Charlott Rakes, MD REFERRING PROVIDER: Charlott Rakes, MD    Past Medical History:  Diagnosis Date   Diabetes mellitus without complication ([place])    Hyperlipidemia    No past surgical history on file. Patient Active Problem List   Diagnosis Date Noted   Obesity (BMI 35.0-39.9 without comorbidity) 03/24/2018   HLD (hyperlipidemia) 05/21/2014   Hypomagnesemia 05/21/2014   DKA (diabetic ketoacidoses) 05/19/2014   Hyponatremia 05/19/2014   Hypokalemia 05/19/2014   DM (diabetes mellitus) ([place]) 05/19/2014   Dehydration 05/19/2014    REFERRING PROVIDER: Charlott Rakes, MD   REFERRING DIAG: Cervical radiculopathy  THERAPY DIAG:  No diagnosis found.  PERTINENT HISTORY: DM  PRECAUTIONS: None  SUBJECTIVE: ***  PAIN:  Are you having pain? Yes NPRS scale: 6/10 Pain location: Low back, right shoulder/arm Pain orientation: Bilateral PAIN TYPE: Chronic Pain description: Constant, tight Aggravating factors: Inactivity, lying down, sitting extended periods Relieving factors: Medication  PATIENT GOALS: Pain relief so can do all things around home and work without hurting   OBJECTIVE:  PATIENT SURVEYS:  FOTO 68% functional status (neck)   POSTURE:  Rounded shoulder and forward head posture   PALPATION: Tender to palpation with trigger points noted right upper trap region, infraspinatus         CERVICAL AROM:   AROM AROM (deg) 10/18/2021  Flexion 60  Extension 55  Right lateral flexion 40  Left lateral flexion 35  Right rotation 75  Left rotation 75*  Patient reports pulling right upper trap with left side bend and bilateral rotation   LUMBAR AROM:   A/PROM AROM  10/18/2021  Flexion WFL  Extension WFL  Right lateral flexion WFL  Left lateral flexion WFL  Right rotation [facility]  Left rotation       [facility]   Patient reports lumbar sitffness/pain with flexion, side bend, and rotation   UE AROM:   AROM Right 10/18/2021 Left 10/18/2021  Shoulder flexion 110 175  Shoulder internal rotation T6 T6  Shoulder external rotation T2 T4  Patient limited on right due to pain   UE MMT:   MMT Right 10/18/2021 Left 10/18/2021  Shoulder flexion 4 5  Shoulder extension 4 5  Shoulder abduction 4 5  Shoulder internal rotation 4+ 5  Shoulder external rotation 4 5  Periscapular musculature 4- 4-  Elbow flexion 5 5  Elbow extension 5 5  Grip strength 50 52  Patient reports right shoulder pain with all right sided muscle strength testing   Patient with upper trap compensation with periscapular muscle testing    TODAY'S TREATMENT:  [facility] Adult PT Treatment:                                                DATE: 10/27/2021 Therapeutic Exercise: LTR 5 x 5 sec Supine posterior pelvic tilt 5 x 5 sec Seated upper trap and levator scap stretch x 30 sec Row with red x 10 Double er and scap retraction with red x 10   [facility] Adult PT Treatment:  DATE: 10/18/2021 Therapeutic Exercise: LTR 5 x 5 sec Supine posterior pelvic tilt 5 x 5 sec Seated upper trap and levator scap stretch x 30 sec Row with red x 10 Double er and scap retraction with red x 10   PATIENT EDUCATION:  Education details: HEP Person educated: Patient Education method: Consulting civil engineer, Demonstration, Corporate treasurer cues, Verbal cues, and Handouts Education comprehension: verbalized understanding, returned demonstration, verbal cues required, tactile cues required, and needs further education   HOME EXERCISE PROGRAM: Access Code: [email or web address]     ASSESSMENT: CLINICAL IMPRESSION: Patient tolerated therapy well with no adverse effects. *** Patient would benefit from continued skilled PT to progress her mobility and strength in order to reduce pain and maximize functional ability.  Patient is a 51 y.o. female who  was seen today for physical therapy evaluation and treatment for chronic lumbar, thoracic, and cervical spine pain, and what seems most consistent with right rotator cuff tendinopathy rather than cervical radiculopathy. All radicular testing negative and patient demonstrates cervical motion grossly WFL without any reproduction of concordant symptoms. She does have pain with right shoulder motion and strength testing consistent with rotator cuff related pain. Her low back pain seems musculoskeletal in nature and her overall symptoms are likely being exacerbated by prolonged periods of poor posture.     OBJECTIVE IMPAIRMENTS decreased activity tolerance, decreased ROM, decreased strength, postural dysfunction, and pain.    ACTIVITY LIMITATIONS cleaning, community activity, occupation, and shopping.    PERSONAL FACTORS Fitness, Past/current experiences, and Time since onset of injury/illness/exacerbation are also affecting patient's functional outcome.      GOALS: Goals reviewed with patient? Yes   SHORT TERM GOALS:   Patient will be I with initial HEP in order to progress with therapy. Baseline: provided at eval Target date: 11/15/2021 Goal status: INITIAL   2.  PT will review FOTO with patient by 3rd visit in order to understand expected progress and outcome with therapy. Baseline: assessed at eval Target date: 11/15/2021 Goal status: INITIAL   3.  Patient will report pain level </= 3/10 in order to reduce functional limitations. Baseline: 6/10 Target date: 11/15/2021 Goal status: INITIAL   LONG TERM GOALS:   Patient will be I with final HEP to maintain progress from PT. Baseline: provided at eval Target date: 12/13/2021 Goal status: INITIAL   2.  Patient will report >/= 70% status on FOTO to indicate improved functional ability. Baseline: 68% functional status  Target date: 12/13/2021 Goal status: INITIAL   3.  Patient will demonstrate right shoulder elevation >/= 160 deg in order to  improve ability to use right arm for household tasks and reaching into overhead cabinets. Baseline: 110 deg Target date: 12/13/2021 Goal status: INITIAL   4.  Patient will report no increased pain or stiffness with lumbar AROM in order to reduce functional limitations with household tasks Baseline: patient reports stiffness/pain with lumbar flexion, side bend, and rotation Target date: 12/13/2021 Goal status: INITIAL   5.  Patient will report no limitation with sitting tolerance due to shoulder pain in order to improve ability to perform work related tasks Baseline: patient reports limitations with sitting due to right shoulder pain Target date: 12/13/2021 Goal status: INITIAL     PLAN: PT FREQUENCY: 1x/week   PT DURATION: 8 weeks   PLANNED INTERVENTIONS: Therapeutic exercises, Therapeutic activity, Neuromuscular re-education, Balance training, Gait training, Patient/Family education, Joint manipulation, Joint mobilization, Aquatic Therapy, Dry Needling, Electrical stimulation, Spinal manipulation, Spinal mobilization, Cryotherapy, Moist heat, Taping, and Manual therapy  PLAN FOR NEXT SESSION: Review HEP and progress PRN, manual/dry needling for right upper trap/infra and lumbar region as needed, core and postural strengthening, rotator cuff strengthening    Hilda Blades, PT, DPT, LAT, ATC 10/23/21  5:16 PM Phone: [phone] Fax: [phone]

## 2021-10-27 ENCOUNTER — Ambulatory Visit: Payer: BC Managed Care – PPO | Admitting: Physical Therapy

## 2021-10-27 ENCOUNTER — Other Ambulatory Visit: Payer: Self-pay

## 2021-10-27 ENCOUNTER — Encounter: Payer: Self-pay | Admitting: Physical Therapy

## 2021-10-27 DIAGNOSIS — R293 Abnormal posture: Secondary | ICD-10-CM | POA: Diagnosis not present

## 2021-10-27 DIAGNOSIS — M79601 Pain in right arm: Secondary | ICD-10-CM | POA: Diagnosis not present

## 2021-10-27 DIAGNOSIS — G8929 Other chronic pain: Secondary | ICD-10-CM | POA: Diagnosis not present

## 2021-10-27 DIAGNOSIS — M545 Low back pain, unspecified: Secondary | ICD-10-CM | POA: Diagnosis not present

## 2021-10-27 DIAGNOSIS — M542 Cervicalgia: Secondary | ICD-10-CM | POA: Diagnosis not present

## 2021-10-27 DIAGNOSIS — M6281 Muscle weakness (generalized): Secondary | ICD-10-CM | POA: Diagnosis not present

## 2021-10-27 DIAGNOSIS — M5412 Radiculopathy, cervical region: Secondary | ICD-10-CM | POA: Diagnosis not present

## 2021-10-30 DIAGNOSIS — M5451 Vertebrogenic low back pain: Secondary | ICD-10-CM | POA: Diagnosis not present

## 2021-10-30 DIAGNOSIS — M542 Cervicalgia: Secondary | ICD-10-CM | POA: Diagnosis not present

## 2021-10-30 NOTE — Therapy (Incomplete)
?OUTPATIENT PHYSICAL THERAPY TREATMENT NOTE ? ? ?Patient Name: Krista Campbell ?MRN: [id number] ?DOB:[birth year], 51 y.o., female ?Today's Date: 10/30/2021 ? ?PCP: Charlott Rakes, MD ?REFERRING PROVIDER: Charlott Rakes, MD ? ? ? ? ?Past Medical History:  ?Diagnosis Date  ? Diabetes mellitus without complication ([place])   ? Hyperlipidemia   ? ?No past surgical history on file. ?Patient Active Problem List  ? Diagnosis Date Noted  ? Obesity (BMI 35.0-39.9 without comorbidity) 03/24/2018  ? HLD (hyperlipidemia) 05/21/2014  ? Hypomagnesemia 05/21/2014  ? DKA (diabetic ketoacidoses) 05/19/2014  ? Hyponatremia 05/19/2014  ? Hypokalemia 05/19/2014  ? DM (diabetes mellitus) ([place]) 05/19/2014  ? Dehydration 05/19/2014  ? ? ?REFERRING PROVIDER: Charlott Rakes, MD ?  ?REFERRING DIAG: Cervical radiculopathy ? ?THERAPY DIAG:  ?No diagnosis found. ? ?PERTINENT HISTORY: DM ? ?PRECAUTIONS: None ? ?SUBJECTIVE: Patient states she is feeling a little better than last visit, she did just get off work. ? ?PAIN:  ?Are you having pain? Yes ?NPRS scale: 7/10 ?Pain location: Low back, right shoulder/arm ?Pain orientation: Bilateral ?PAIN TYPE: Chronic ?Pain description: Constant, tight ?Aggravating factors: Inactivity, lying down, sitting extended periods ?Relieving factors: Medication ? ?PATIENT GOALS: Pain relief so can do all things around home and work without hurting ? ? ?OBJECTIVE: (measures assessed at evaluation unless otherwise noted) ?PATIENT SURVEYS:  ?FOTO 68% functional status (neck) ?  ?POSTURE:  ?Rounded shoulder and forward head posture ?  ?PALPATION: ?Tender to palpation with trigger points noted right upper trap region, infraspinatus       ?  ?CERVICAL AROM: ?  ?AROM AROM (deg) ?10/18/2021  ?Flexion 60  ?Extension 55  ?Right lateral flexion 40  ?Left lateral flexion 35  ?Right rotation 75  ?Left rotation 75*  ?Patient reports pulling right upper trap with left side bend and bilateral rotation ?  ?LUMBAR AROM: ?  ?A/PROM AROM   ?10/18/2021  ?Flexion WFL  ?Extension WFL  ?Right lateral flexion WFL  ?Left lateral flexion WFL  ?Right rotation WFL  ?Left rotation  WFL  ?Patient reports lumbar sitffness/pain with flexion, side bend, and rotation ?  ?UE AROM: ?  ?AROM Right ?10/18/2021 Left ?10/18/2021 Right ?10/27/2021  ?Shoulder flexion 110 175 170  ?Shoulder internal rotation T6 T6   ?Shoulder external rotation T2 T4   ?Patient limited on right due to pain ?  ?UE MMT: ?  ?MMT Right ?10/18/2021 Left ?10/18/2021  ?Shoulder flexion 4 5  ?Shoulder extension 4 5  ?Shoulder abduction 4 5  ?Shoulder internal rotation 4+ 5  ?Shoulder external rotation 4 5  ?Periscapular musculature 4- 4-  ?Elbow flexion 5 5  ?Elbow extension 5 5  ?Grip strength 50 52  ?Patient reports right shoulder pain with all right sided muscle strength testing ?  ?Patient with upper trap compensation with periscapular muscle testing ?  ? ?TODAY'S TREATMENT:  ?[facility] Adult PT Treatment:                                                DATE: 11/01/2021 ?Therapeutic Exercise: ?NuStep L6 x 5 min with UE/LE while taking subjective ? Seated upper trap and levator scap stretch x 30 sec ?LTR 5 x 5 sec ?Supine posterior pelvic tilt 10 x 5 sec ?Bridge 2 x 5 ?Side clamshell with red 2 x 10 each ?Row with red 2 x 10 ?Double er and scap retraction with red  2 x 10 ?Manual: ?Skilled palpation and monitoring of muscle tension while performing TPDN treatment ?STM right upper trap and infraspinatus regions ?Trigger Point Dry Needling Treatment: ?Pre-treatment instruction: Patient instructed on dry needling rationale, procedures, and possible side effects including pain during treatment (achy,cramping feeling), bruising, drop of blood, lightheadedness, nausea, sweating. ?Patient Consent Given: Yes ?Education handout provided: No ?Muscles treated: Right upper trap, infraspinatus  ?Needle size and number: .30x69m x 3 ?Electrical stimulation performed: No ?Parameters: N/A ?Treatment response/outcome: Twitch  response elicited, Palpable decrease in muscle tension, and patient demonstrates improved motion with less pain ?Post-treatment instructions: Patient instructed to expect possible mild to moderate muscle soreness later today and/or tomorrow. Patient instructed in methods to reduce muscle soreness and to continue prescribed HEP. If patient was dry needled over the lung field, patient was instructed on signs and symptoms of pneumothorax and, however unlikely, to see immediate medical attention should they occur. Patient was also educated on signs and symptoms of infection and to seek medical attention should they occur. Patient verbalized understanding of these instructions and education. ? ? [facility]                                                [facility]: [facility] ?Therapeutic Exercise: ?NuStep L6 x 5 min with UE/LE while taking subjective ? Seated upper trap and levator scap stretch x 30 sec ?LTR 5 x 5 sec ?Supine posterior pelvic tilt 10 x 5 sec ?Bridge 2 x 5 ?Side clamshell with red 2 x 10 each ?Row with red 2 x 10 ?Double er and scap retraction with red 2 x 10 ?Manual: ?Skilled palpation and monitoring of muscle tension while performing TPDN treatment ?STM right upper trap and infraspinatus regions ?Trigger Point Dry Needling Treatment: ?Pre-treatment instruction: Patient instructed on dry needling rationale, procedures, and possible side effects including pain during treatment (achy,cramping feeling), bruising, drop of blood, lightheadedness, nausea, sweating. ?Patient Consent Given: Yes ?Education handout provided: No ?Muscles treated: Right upper trap, infraspinatus  ?Needle size and number: .30x575mx 3 ?Electrical stimulation performed: No ?Parameters: N/A ?Treatment response/outcome: Twitch response elicited, Palpable decrease in muscle tension, and patient demonstrates improved motion with less pain ?Post-treatment instructions: Patient instructed to expect possible mild to moderate muscle  soreness later today and/or tomorrow. Patient instructed in methods to reduce muscle soreness and to continue prescribed HEP. If patient was dry needled over the lung field, patient was instructed on signs and symptoms of pneumothorax and, however unlikely, to see immediate medical attention should they occur. Patient was also educated on signs and symptoms of infection and to seek medical attention should they occur. Patient verbalized understanding of these instructions and education. ? ?[facility]                                                DATE: 10[facility]21 ?Therapeutic Exercise: ?LTR 5 x 5 sec ?Supine posterior pelvic tilt 5 x 5 sec ?Seated upper trap and levator scap stretch x 30 sec ?Row with red x 10 ?Double er and scap retraction with red x 10 ?  ?PATIENT EDUCATION:  ?Education details: HEP, TPDN ?Person educated: Patient ?Education method: Explanation, Demonstration, Tactile cues, Verbal cues ?Education comprehension: verbalized  understanding, returned demonstration, verbal cues required, tactile cues required, and needs further education ?  ?HOME EXERCISE PROGRAM: ?Access Code: [email or web address] ?  ?  ?ASSESSMENT: ?CLINICAL IMPRESSION: ?Patient tolerated therapy well with no adverse effects. *** Patient would benefit from continued skilled PT to progress her mobility and strength in order to reduce pain and maximize functional ability. ? ?Performed TPDN this visit for right upper trap and infraspinatus with twitch response and patient reported reproduction of right shoulder/upper arm pain. She did demonstrate great improvement in right shoulder elevation post treatment and reported less pain. Therapy continues to focus on progressing mobility and stretching of lumbar and right shoulder region. No changes to HEP this visit. Will assess response to dry needling and perform for lumbar region as needed.  ?  ?  ?OBJECTIVE IMPAIRMENTS decreased activity tolerance, decreased ROM, decreased strength,  postural dysfunction, and pain.  ?  ?ACTIVITY LIMITATIONS cleaning, community activity, occupation, and shopping.  ?  ?PERSONAL FACTORS Fitness, Past/current experiences, and Time since onset of injury/illness/exa

## 2021-11-01 ENCOUNTER — Encounter: Payer: BC Managed Care – PPO | Admitting: Physical Therapy

## 2021-11-06 NOTE — Therapy (Signed)
?OUTPATIENT PHYSICAL THERAPY TREATMENT NOTE ? ? ?Patient Name: Krista Campbell ?MRN: [id number] ?DOB:20-Jan-1971, 51 y.o., female ?Today's Date: 11/07/2021 ? ?PCP: Charlott Rakes, MD ?REFERRING PROVIDER: Charlott Rakes, MD ? ? PT End of Session - 11/07/21 0962   ? ? Visit Number 3   ? Number of Visits 8   ? Date for PT Re-Evaluation 12/13/21   ? Authorization Type BCBS   ? PT Start Time 8366   ? PT Stop Time 1000   ? PT Time Calculation (min) 45 min   ? Activity Tolerance Patient tolerated treatment well   ? Behavior During Therapy [facility] for tasks assessed/performed   ? ?  ?  ? ?  ? ? ? ?Past Medical History:  ?Diagnosis Date  ? Diabetes mellitus without complication ([place])   ? Hyperlipidemia   ? ?History reviewed. No pertinent surgical history. ?Patient Active Problem List  ? Diagnosis Date Noted  ? Obesity (BMI 35.0-39.9 without comorbidity) 03/24/2018  ? HLD (hyperlipidemia) 05/21/2014  ? Hypomagnesemia 05/21/2014  ? DKA (diabetic ketoacidoses) 05/19/2014  ? Hyponatremia 05/19/2014  ? Hypokalemia 05/19/2014  ? DM (diabetes mellitus) ([place]) 05/19/2014  ? Dehydration 05/19/2014  ? ? ?REFERRING PROVIDER: Charlott Rakes, MD ?  ?REFERRING DIAG: Cervical radiculopathy ? ?THERAPY DIAG:  ?Chronic bilateral low back pain, unspecified whether sciatica present ? ?Pain in right arm ? ?Cervicalgia ? ?Muscle weakness (generalized) ? ?Abnormal posture ? ?PERTINENT HISTORY: DM ? ?PRECAUTIONS: None ? ?SUBJECTIVE: Patient states she is feeling good today. ? ?PAIN:  ?Are you having pain? Yes ?NPRS scale: 5/10 ?Pain location: Low back, right shoulder/arm ?Pain orientation: Bilateral ?PAIN TYPE: Chronic ?Pain description: Constant, tight ?Aggravating factors: Inactivity, lying down, sitting extended periods ?Relieving factors: Medication ? ?PATIENT GOALS: Pain relief so can do all things around home and work without hurting ? ? ?OBJECTIVE: (measures assessed at evaluation unless otherwise noted) ?PATIENT SURVEYS:  ?FOTO 68% functional  status (neck) ?  ?POSTURE:  ?Rounded shoulder and forward head posture ?  ?PALPATION: ?Tender to palpation with trigger points noted right upper trap region, infraspinatus       ?  ?CERVICAL AROM: ?  ?AROM AROM (deg) ?10/18/2021  ?Flexion 60  ?Extension 55  ?Right lateral flexion 40  ?Left lateral flexion 35  ?Right rotation 75  ?Left rotation 75*  ?Patient reports pulling right upper trap with left side bend and bilateral rotation ?  ?LUMBAR AROM: ?  ?A/PROM AROM  ?10/18/2021  ?11/07/2021  ?Flexion [facility] WFL - reduced stiffness  ?Extension WFL   ?Right lateral flexion WFL   ?Left lateral flexion WFL   ?Right rotation WFL   ?Left rotation  WFL   ?Patient reports lumbar sitffness/pain with flexion, side bend, and rotation ?  ?UE AROM: ?  ?AROM Right ?10/18/2021 Left ?10/18/2021 Right ?10/27/2021  ?Shoulder flexion 110 175 170  ?Shoulder internal rotation T6 T6   ?Shoulder external rotation T2 T4   ?Patient limited on right due to pain ?  ?UE MMT: ?  ?MMT Right ?10/18/2021 Left ?10/18/2021  ?Shoulder flexion 4 5  ?Shoulder extension 4 5  ?Shoulder abduction 4 5  ?Shoulder internal rotation 4+ 5  ?Shoulder external rotation 4 5  ?Periscapular musculature 4- 4-  ?Elbow flexion 5 5  ?Elbow extension 5 5  ?Grip strength 50 52  ?Patient reports right shoulder pain with all right sided muscle strength testing ?  ?Patient with upper trap compensation with periscapular muscle testing ?  ? ?TODAY'S TREATMENT:  ?[place] Adult PT Treatment:  DATE: 11/07/2021 ?Therapeutic Exercise: ?NuStep L6 x 5 min with UE/LE while taking subjective ?LTR 5 x 5 sec ?Child's pose stretch x 15 sec fwd, 2 x 15 sec for right ?Supine posterior pelvic tilt 10 x 5 sec using belt and FM ?Bridge 2 x 10 ?90-90 alternating heel tap 2 x 10 ?Side clamshell with red 2 x 10 each ?Manual: ?Skilled palpation and monitoring of muscle tension while performing TPDN treatment ?STM right upper trap and lumbar QL regions ?Trigger Point Dry  Needling Treatment: ?Pre-treatment instruction: Patient instructed on dry needling rationale, procedures, and possible side effects including pain during treatment (achy,cramping feeling), bruising, drop of blood, lightheadedness, nausea, sweating. ?Patient Consent Given: Yes ?Education handout provided: No ?Muscles treated: Right upper trap, right lumbar multifidi and QL  ?Needle size and number: .30x107m x 2, .30x721mx 1, .40x105 x 1 ?Electrical stimulation performed: No ?Parameters: N/A ?Treatment response/outcome: Twitch response elicited, Palpable decrease in muscle tension, and patient demonstrates improved motion with less pain ?Post-treatment instructions: Patient instructed to expect possible mild to moderate muscle soreness later today and/or tomorrow. Patient instructed in methods to reduce muscle soreness and to continue prescribed HEP. If patient was dry needled over the lung field, patient was instructed on signs and symptoms of pneumothorax and, however unlikely, to see immediate medical attention should they occur. Patient was also educated on signs and symptoms of infection and to seek medical attention should they occur. Patient verbalized understanding of these instructions and education. ? ? [facility]                                                [facility]: [facility] ?Therapeutic Exercise: ?NuStep L6 x 5 min with UE/LE while taking subjective ? Seated upper trap and levator scap stretch x 30 sec ?LTR 5 x 5 sec ?Supine posterior pelvic tilt 10 x 5 sec ?Bridge 2 x 5 ?Side clamshell with red 2 x 10 each ?Row with red 2 x 10 ?Double er and scap retraction with red 2 x 10 ?Manual: ?Skilled palpation and monitoring of muscle tension while performing TPDN treatment ?STM right upper trap and infraspinatus regions ?Trigger Point Dry Needling Treatment: ?Pre-treatment instruction: Patient instructed on dry needling rationale, procedures, and possible side effects including pain during  treatment (achy,cramping feeling), bruising, drop of blood, lightheadedness, nausea, sweating. ?Patient Consent Given: Yes ?Education handout provided: No ?Muscles treated: Right upper trap, infraspinatus  ?Needle size and number: .30x5028m 3 ?Electrical stimulation performed: No ?Parameters: N/A ?Treatment response/outcome: Twitch response elicited, Palpable decrease in muscle tension, and patient demonstrates improved motion with less pain ?Post-treatment instructions: Patient instructed to expect possible mild to moderate muscle soreness later today and/or tomorrow. Patient instructed in methods to reduce muscle soreness and to continue prescribed HEP. If patient was dry needled over the lung field, patient was instructed on signs and symptoms of pneumothorax and, however unlikely, to see immediate medical attention should they occur. Patient was also educated on signs and symptoms of infection and to seek medical attention should they occur. Patient verbalized understanding of these instructions and education. ? ?[facility]  [facility] ?Therapeutic Exercise: ?LTR 5 x 5 sec ?Supine posterior pelvic tilt 5 x 5 sec ?Seated upper trap and levator scap stretch x 30 sec ?Row with red x 10 ?Double er and scap retraction with red x 10 ?  ?PATIENT EDUCATION:  ?Education details: HEP, TPDN ?Person educated: Patient ?Education method: Explanation, Demonstration, Tactile cues, Verbal cues ?Education comprehension: verbalized understanding, returned demonstration, verbal cues required, tactile cues required, and needs further education ?  ?HOME EXERCISE PROGRAM: ?Access Code: [email or web address] ?  ?  ?ASSESSMENT: ?CLINICAL IMPRESSION: ?Patient tolerated therapy well with no adverse effects. Continued with TPDN this visit for right neck and lower back with good benefit. Therapy focused primarily on lower back this visit and continued core strengthening. Patient did  received an update PT referral that included aquatic therapy for lower back pain so patient will transition to alternating between land and pool therapy. No changes to HEP. Patient would benefit from continued skill

## 2021-11-07 ENCOUNTER — Encounter: Payer: Self-pay | Admitting: Physical Therapy

## 2021-11-07 ENCOUNTER — Other Ambulatory Visit: Payer: Self-pay

## 2021-11-07 ENCOUNTER — Ambulatory Visit: Payer: BC Managed Care – PPO | Admitting: Physical Therapy

## 2021-11-07 DIAGNOSIS — M545 Low back pain, unspecified: Secondary | ICD-10-CM

## 2021-11-07 DIAGNOSIS — M79601 Pain in right arm: Secondary | ICD-10-CM

## 2021-11-07 DIAGNOSIS — M542 Cervicalgia: Secondary | ICD-10-CM | POA: Diagnosis not present

## 2021-11-07 DIAGNOSIS — M6281 Muscle weakness (generalized): Secondary | ICD-10-CM

## 2021-11-07 DIAGNOSIS — R293 Abnormal posture: Secondary | ICD-10-CM

## 2021-11-07 DIAGNOSIS — M5412 Radiculopathy, cervical region: Secondary | ICD-10-CM | POA: Diagnosis not present

## 2021-11-07 DIAGNOSIS — G8929 Other chronic pain: Secondary | ICD-10-CM | POA: Diagnosis not present

## 2021-11-08 ENCOUNTER — Other Ambulatory Visit: Payer: Self-pay | Admitting: Family Medicine

## 2021-11-08 DIAGNOSIS — Z794 Long term (current) use of insulin: Secondary | ICD-10-CM

## 2021-11-13 NOTE — Therapy (Signed)
?OUTPATIENT PHYSICAL THERAPY TREATMENT NOTE ? ? ?Patient Name: Krista Campbell ?MRN: [id number] ?DOB:07-29-1971, 51 y.o., female ?Today's Date: 11/14/2021 ? ?PCP: Charlott Rakes, MD ?REFERRING PROVIDER: Charlott Rakes, MD ? ? PT End of Session - 11/14/21 9528   ? ? Visit Number 4   ? Number of Visits 8   ? Date for PT Re-Evaluation 12/13/21   ? Authorization Type BCBS   ? PT Start Time 4132   ? PT Stop Time 1000   ? PT Time Calculation (min) 45 min   ? Activity Tolerance Patient tolerated treatment well   ? Behavior During Therapy [facility] for tasks assessed/performed   ? ?  ?  ? ?  ? ? ? ? ?Past Medical History:  ?Diagnosis Date  ? Diabetes mellitus without complication ([place])   ? Hyperlipidemia   ? ?History reviewed. No pertinent surgical history. ?Patient Active Problem List  ? Diagnosis Date Noted  ? Obesity (BMI 35.0-39.9 without comorbidity) 03/24/2018  ? HLD (hyperlipidemia) 05/21/2014  ? Hypomagnesemia 05/21/2014  ? DKA (diabetic ketoacidoses) 05/19/2014  ? Hyponatremia 05/19/2014  ? Hypokalemia 05/19/2014  ? DM (diabetes mellitus) ([place]) 05/19/2014  ? Dehydration 05/19/2014  ? ? ?REFERRING PROVIDER: Charlott Rakes, MD ?  ?REFERRING DIAG: Cervical radiculopathy ? ?THERAPY DIAG:  ?Chronic bilateral low back pain, unspecified whether sciatica present ? ?Pain in right arm ? ?Cervicalgia ? ?Muscle weakness (generalized) ? ?Abnormal posture ? ?PERTINENT HISTORY: DM ? ?PRECAUTIONS: None ? ?SUBJECTIVE: Patient reports she felt excellent following last visit. ? ?PAIN:  ?Are you having pain? Yes ?NPRS scale: 4/10 ?Pain location: Low back, right shoulder/arm ?Pain orientation: Bilateral ?PAIN TYPE: Chronic ?Pain description: Constant, tight ?Aggravating factors: Inactivity, lying down, sitting extended periods ?Relieving factors: Medication ? ?PATIENT GOALS: Pain relief so can do all things around home and work without hurting ? ? ?OBJECTIVE: (measures assessed at evaluation unless otherwise noted) ?PATIENT SURVEYS:   ?FOTO 68% functional status (neck) ?  ?POSTURE:  ?Rounded shoulder and forward head posture ?  ?PALPATION: ?Tender to palpation with trigger points noted right upper trap region, infraspinatus       ?  ?CERVICAL AROM: ?  ?AROM AROM (deg) ?10/18/2021  ?Flexion 60  ?Extension 55  ?Right lateral flexion 40  ?Left lateral flexion 35  ?Right rotation 75  ?Left rotation 75*  ?Patient reports pulling right upper trap with left side bend and bilateral rotation ?  ?LUMBAR AROM: ?  ?A/PROM AROM  ?10/18/2021  ?11/07/2021  ?Flexion [facility] WFL - reduced stiffness  ?Extension WFL   ?Right lateral flexion WFL   ?Left lateral flexion WFL   ?Right rotation WFL   ?Left rotation  WFL   ?Patient reports lumbar sitffness/pain with flexion, side bend, and rotation ?  ?UE AROM: ?  ?AROM Right ?10/18/2021 Left ?10/18/2021 Right ?10/27/2021  ?Shoulder flexion 110 175 170  ?Shoulder internal rotation T6 T6   ?Shoulder external rotation T2 T4   ?Patient limited on right due to pain ?  ?UE MMT: ?  ?MMT Right ?10/18/2021 Left ?10/18/2021  ?Shoulder flexion 4 5  ?Shoulder extension 4 5  ?Shoulder abduction 4 5  ?Shoulder internal rotation 4+ 5  ?Shoulder external rotation 4 5  ?Periscapular musculature 4- 4-  ?Elbow flexion 5 5  ?Elbow extension 5 5  ?Grip strength 50 52  ?Patient reports right shoulder pain with all right sided muscle strength testing ?  ?Patient with upper trap compensation with periscapular muscle testing ?  ? ?TODAY'S TREATMENT:  ?[place] Adult PT  Treatment:                                                DATE: 11/14/2021 ?Therapeutic Exercise: ?NuStep L6 x 5 min with UE/LE while taking subjective ?LTR 10 x 5 sec ?Seated lumbar flexion stretch physioball roll out 5 x 5 sec forward, 3 x 5 sec each side ?Sidelying thoracolumbar rotation 5 x 5 sec each ?Cat cow 5 x 5 sec ?Posterior pelvic tilt 10 x 5 sec ?Manual: ?Skilled palpation and monitoring of muscle tension while performing TPDN treatment ?STM lumbar paraspinals ?Trigger Point Dry Needling  Treatment: ?Pre-treatment instruction: Patient instructed on dry needling rationale, procedures, and possible side effects including pain during treatment (achy,cramping feeling), bruising, drop of blood, lightheadedness, nausea, sweating. ?Patient Consent Given: Yes ?Education handout provided: No ?Muscles treated: Bilat lumbar multifidi  ?Needle size and number: .30x23m x 4 ?Electrical stimulation performed: Yes ?Parameters: Milli 4 frequency intensity to patient tolerance x 10 minutes ?Treatment response/outcome: Twitch response elicited, Palpable decrease in muscle tension, and patient demonstrates improved motion with less pain ?Post-treatment instructions: Patient instructed to expect possible mild to moderate muscle soreness later today and/or tomorrow. Patient instructed in methods to reduce muscle soreness and to continue prescribed HEP. If patient was dry needled over the lung field, patient was instructed on signs and symptoms of pneumothorax and, however unlikely, to see immediate medical attention should they occur. Patient was also educated on signs and symptoms of infection and to seek medical attention should they occur. Patient verbalized understanding of these instructions and education. ? ? [facility]                                                [facility] ?Therapeutic Exercise: ?NuStep L6 x 5 min with UE/LE while taking subjective ?LTR 5 x 5 sec ?Child's pose stretch x 15 sec fwd, 2 x 15 sec for right ?Supine posterior pelvic tilt 10 x 5 sec using belt and FM ?Bridge 2 x 10 ?90-90 alternating heel tap 2 x 10 ?Side clamshell with red 2 x 10 each ?Manual: ?Skilled palpation and monitoring of muscle tension while performing TPDN treatment ?STM right upper trap and lumbar QL regions ?Trigger Point Dry Needling Treatment: ?Pre-treatment instruction: Patient instructed on dry needling rationale, procedures, and possible side effects including pain during treatment (achy,cramping  feeling), bruising, drop of blood, lightheadedness, nausea, sweating. ?Patient Consent Given: Yes ?Education handout provided: No ?Muscles treated: Right upper trap, right lumbar multifidi and QL  ?Needle size and number: .30x552mx 2, .30x7516m 1, .40x105 x 1 ?Electrical stimulation performed: No ?Parameters: N/A ?Treatment response/outcome: Twitch response elicited, Palpable decrease in muscle tension, and patient demonstrates improved motion with less pain ?Post-treatment instructions: Patient instructed to expect possible mild to moderate muscle soreness later today and/or tomorrow. Patient instructed in methods to reduce muscle soreness and to continue prescribed HEP. If patient was dry needled over the lung field, patient was instructed on signs and symptoms of pneumothorax and, however unlikely, to see immediate medical attention should they occur. Patient was also educated on signs and symptoms of infection and to seek medical attention should they occur. Patient verbalized understanding of these instructions and education. ? ?  [facility] Adult PT Treatment:                                                DATE: 10/27/2021 ?Therapeutic Exercise: ?NuStep L6 x 5 min with UE/LE while taking subjective ? Seated upper trap and levator scap stretch x 30 sec ?LTR 5 x 5 sec ?Supine posterior pelvic tilt 10 x 5 sec ?Bridge 2 x 5 ?Side clamshell with red 2 x 10 each ?Row with red 2 x 10 ?Double er and scap retraction with red 2 x 10 ?Manual: ?Skilled palpation and monitoring of muscle tension while performing TPDN treatment ?STM right upper trap and infraspinatus regions ?Trigger Point Dry Needling Treatment: ?Pre-treatment instruction: Patient instructed on dry needling rationale, procedures, and possible side effects including pain during treatment (achy,cramping feeling), bruising, drop of blood, lightheadedness, nausea, sweating. ?Patient Consent Given: Yes ?Education handout provided: No ?Muscles treated: Right upper  trap, infraspinatus  ?Needle size and number: .30x60m x 3 ?Electrical stimulation performed: No ?Parameters: N/A ?Treatment response/outcome: Twitch response elicited, Palpable decrease in muscle tension, and p

## 2021-11-14 ENCOUNTER — Encounter: Payer: Self-pay | Admitting: Physical Therapy

## 2021-11-14 ENCOUNTER — Other Ambulatory Visit: Payer: Self-pay

## 2021-11-14 ENCOUNTER — Ambulatory Visit: Payer: BC Managed Care – PPO | Attending: Family Medicine | Admitting: Physical Therapy

## 2021-11-14 DIAGNOSIS — M545 Low back pain, unspecified: Secondary | ICD-10-CM | POA: Insufficient documentation

## 2021-11-14 DIAGNOSIS — R293 Abnormal posture: Secondary | ICD-10-CM | POA: Diagnosis not present

## 2021-11-14 DIAGNOSIS — M542 Cervicalgia: Secondary | ICD-10-CM | POA: Insufficient documentation

## 2021-11-14 DIAGNOSIS — G8929 Other chronic pain: Secondary | ICD-10-CM | POA: Diagnosis not present

## 2021-11-14 DIAGNOSIS — M79601 Pain in right arm: Secondary | ICD-10-CM | POA: Insufficient documentation

## 2021-11-14 DIAGNOSIS — M6281 Muscle weakness (generalized): Secondary | ICD-10-CM | POA: Insufficient documentation

## 2021-11-22 ENCOUNTER — Ambulatory Visit: Payer: BC Managed Care – PPO | Admitting: Physical Therapy

## 2021-11-27 ENCOUNTER — Encounter: Payer: Self-pay | Admitting: Physical Therapy

## 2021-11-27 ENCOUNTER — Other Ambulatory Visit: Payer: Self-pay

## 2021-11-27 ENCOUNTER — Ambulatory Visit: Payer: BC Managed Care – PPO | Admitting: Physical Therapy

## 2021-11-27 DIAGNOSIS — M79601 Pain in right arm: Secondary | ICD-10-CM

## 2021-11-27 DIAGNOSIS — G8929 Other chronic pain: Secondary | ICD-10-CM | POA: Diagnosis not present

## 2021-11-27 DIAGNOSIS — M6281 Muscle weakness (generalized): Secondary | ICD-10-CM | POA: Diagnosis not present

## 2021-11-27 DIAGNOSIS — M542 Cervicalgia: Secondary | ICD-10-CM

## 2021-11-27 DIAGNOSIS — M545 Low back pain, unspecified: Secondary | ICD-10-CM | POA: Diagnosis not present

## 2021-11-27 DIAGNOSIS — R293 Abnormal posture: Secondary | ICD-10-CM

## 2021-11-27 NOTE — Patient Instructions (Signed)
Access Code: [email or web address] ?URL: [email or web address] ?Date: 11/27/2021 ?Prepared by: Hilda Blades ? ?Exercises ?- Supine Lower Trunk Rotation  - 1-2 x daily - 10 reps - 5 seconds hold ?- Supine Piriformis Stretch with Foot on Ground  - 1-2 x daily - 3 reps - 20 seconds hold ?- Child's Pose Stretch  - 1-2 x daily - 3 reps - 20 sec hold ?- Bridge  - 1 x daily - 2 sets - 10 reps - 3 sec hold ?- Hooklying Clamshell with Resistance  - 1 x daily - 2 sets - 10 reps - 3 sec hold ?- Sidelying Hip Abduction  - 1 x daily - 2 sets - 10 reps ?- Seated Cervical Sidebending Stretch  - 1-2 x daily - 3 reps - 20 seconds hold ?- Seated Levator Scapulae Stretch  - 1-2 x daily - 3 reps - 20 seconds hold ?- Standing Row with Anchored Resistance  - 1-2 x daily - 2 sets - 10 reps ?- Shoulder External Rotation and Scapular Retraction with Resistance  - 1-2 x daily - 2 sets - 10 reps ?

## 2021-11-27 NOTE — Therapy (Signed)
?OUTPATIENT PHYSICAL THERAPY TREATMENT NOTE ? ? ?Patient Name: Krista Campbell ?MRN: [id number] ?DOB:1971-05-08, 51 y.o., female ?Today's Date: 11/27/2021 ? ?PCP: Charlott Rakes, MD ?REFERRING PROVIDER: Charlott Rakes, MD ? ? PT End of Session - 11/27/21 0920   ? ? Visit Number 5   ? Number of Visits 8   ? Date for PT Re-Evaluation 12/13/21   ? Authorization Type BCBS   ? PT Start Time 8242   ? PT Stop Time 1000   ? PT Time Calculation (min) 45 min   ? Activity Tolerance Patient tolerated treatment well   ? Behavior During Therapy [facility] for tasks assessed/performed   ? ?  ?  ? ?  ? ? ? ? ? ?Past Medical History:  ?Diagnosis Date  ? Diabetes mellitus without complication ([place])   ? Hyperlipidemia   ? ?History reviewed. No pertinent surgical history. ?Patient Active Problem List  ? Diagnosis Date Noted  ? Obesity (BMI 35.0-39.9 without comorbidity) 03/24/2018  ? HLD (hyperlipidemia) 05/21/2014  ? Hypomagnesemia 05/21/2014  ? DKA (diabetic ketoacidoses) 05/19/2014  ? Hyponatremia 05/19/2014  ? Hypokalemia 05/19/2014  ? DM (diabetes mellitus) ([place]) 05/19/2014  ? Dehydration 05/19/2014  ? ? ?REFERRING PROVIDER: Charlott Rakes, MD ?  ?REFERRING DIAG: Cervical radiculopathy ? ?THERAPY DIAG:  ?Chronic bilateral low back pain, unspecified whether sciatica present ? ?Pain in right arm ? ?Cervicalgia ? ?Muscle weakness (generalized) ? ?Abnormal posture ? ?PERTINENT HISTORY: DM ? ?PRECAUTIONS: None ? ?SUBJECTIVE: Patient reports she is hurting a little bit more today, left lower back discomfort. She missed PT last week due to being out of town and she could tell. ? ?PAIN:  ?Are you having pain? Yes ?NPRS scale: 5/10 ?Pain location: Low back ?Pain orientation: Bilateral (left > right) ?PAIN TYPE: Chronic ?Pain description: Constant, tight ?Aggravating factors: Inactivity, lying down, sitting extended periods ?Relieving factors: Medication ? ?PATIENT GOALS: Pain relief so can do all things around home and work without  hurting ? ? ?OBJECTIVE: (measures assessed at evaluation unless otherwise noted) ?PATIENT SURVEYS:  ?FOTO 68% functional status (neck) ?  ?POSTURE:  ?Rounded shoulder and forward head posture ?  ?PALPATION: ?Tender to palpation with trigger points noted right upper trap region, infraspinatus       ?  ?CERVICAL AROM: ?  ?AROM AROM (deg) ?10/18/2021  ?Flexion 60  ?Extension 55  ?Right lateral flexion 40  ?Left lateral flexion 35  ?Right rotation 75  ?Left rotation 75*  ?Patient reports pulling right upper trap with left side bend and bilateral rotation ?  ?LUMBAR AROM: ?  ?A/PROM AROM  ?10/18/2021  ?11/07/2021  ?Flexion [facility] WFL - reduced stiffness  ?Extension WFL   ?Right lateral flexion WFL   ?Left lateral flexion WFL   ?Right rotation WFL   ?Left rotation  WFL   ?Patient reports lumbar sitffness/pain with flexion, side bend, and rotation ?  ?UE AROM: ?  ?AROM Right ?10/18/2021 Left ?10/18/2021 Right ?10/27/2021  ?Shoulder flexion 110 175 170  ?Shoulder internal rotation T6 T6   ?Shoulder external rotation T2 T4   ?Patient limited on right due to pain ?  ?UE MMT: ?  ?MMT Right ?10/18/2021 Left ?10/18/2021  ?Shoulder flexion 4 5  ?Shoulder extension 4 5  ?Shoulder abduction 4 5  ?Shoulder internal rotation 4+ 5  ?Shoulder external rotation 4 5  ?Periscapular musculature 4- 4-  ?Elbow flexion 5 5  ?Elbow extension 5 5  ?Grip strength 50 52  ?Patient reports right shoulder pain with all right sided  muscle strength testing ?  ?Patient with upper trap compensation with periscapular muscle testing ? ?L MMT: ?  ?MMT Right ?11/27/2021 Left ?11/27/2021  ?Hip flexion 4 4  ?Hip extension 4- 4-  ?Hip abduction 4- 4-  ? ?  ? ?TODAY'S TREATMENT:  ?[facility] Adult PT Treatment:                                                DATE: 11/27/2021 ?Therapeutic Exercise: ?NuStep L6 x 5 min with UE/LE while taking subjective ?LTR 10 x 5 sec ?Piriformis stretch 2 x 20 sec each ?Figure-4 LTR 3 x 5 sec each ?Child's pose stretch 3 x 20 sec each side ?Bridge 10 x 3  sec ?Hooklying clamshell with blue 10 x 3 sec ?Sidelying hip abduction 2 x 10 ?Manual: ?Skilled palpation and monitoring of muscle tension while performing TPDN treatment ?STM lumbar paraspinals ?Trigger Point Dry Needling Treatment: ?Pre-treatment instruction: Patient instructed on dry needling rationale, procedures, and possible side effects including pain during treatment (achy,cramping feeling), bruising, drop of blood, lightheadedness, nausea, sweating. ?Patient Consent Given: Yes ?Education handout provided: No ?Muscles treated: Left lumbar multifidi  ?Needle size and number: .30x5m x 3 ?Electrical stimulation performed: No ?Parameters: N/A ?Treatment response/outcome: Twitch response elicited, Palpable decrease in muscle tension, and patient reports improvement in symptoms ?Post-treatment instructions: Patient instructed to expect possible mild to moderate muscle soreness later today and/or tomorrow. Patient instructed in methods to reduce muscle soreness and to continue prescribed HEP. If patient was dry needled over the lung field, patient was instructed on signs and symptoms of pneumothorax and, however unlikely, to see immediate medical attention should they occur. Patient was also educated on signs and symptoms of infection and to seek medical attention should they occur. Patient verbalized understanding of these instructions and education. ? ? [facility]                                                [facility] ?Therapeutic Exercise: ?NuStep L6 x 5 min with UE/LE while taking subjective ?LTR 10 x 5 sec ?Seated lumbar flexion stretch physioball roll out 5 x 5 sec forward, 3 x 5 sec each side ?Sidelying thoracolumbar rotation 5 x 5 sec each ?Cat cow 5 x 5 sec ?Posterior pelvic tilt 10 x 5 sec ?Manual: ?Skilled palpation and monitoring of muscle tension while performing TPDN treatment ?STM lumbar paraspinals ?Trigger Point Dry Needling Treatment: ?Pre-treatment instruction: Patient  instructed on dry needling rationale, procedures, and possible side effects including pain during treatment (achy,cramping feeling), bruising, drop of blood, lightheadedness, nausea, sweating. ?Patient Consent Given: Yes ?Education handout provided: No ?Muscles treated: Bilat lumbar multifidi  ?Needle size and number: .30x740mx 4 ?Electrical stimulation performed: Yes ?Parameters: Milli 4 frequency intensity to patient tolerance x 10 minutes ?Treatment response/outcome: Twitch response elicited, Palpable decrease in muscle tension, and patient demonstrates improved motion with less pain ?Post-treatment instructions: Patient instructed to expect possible mild to moderate muscle soreness later today and/or tomorrow. Patient instructed in methods to reduce muscle soreness and to continue prescribed HEP. If patient was dry needled over the lung field, patient was instructed on signs and symptoms of pneumothorax and, however unlikely, to see immediate medical attention  should they occur. Patient was also educated on signs and symptoms of infection and to seek medical attention should they occur. Patient verbalized understanding of these instructions and education. ? ?[facility] Adult PT Treatment:                                                DATE: 11/07/2021 ?Therapeutic Exercise: ?NuStep L6 x 5 min with UE/LE while taking subjective ?LTR 5 x 5 sec ?Child's pose stretch x 15 sec fwd, 2 x 15 sec for right ?Supine posterior pelvic tilt 10 x 5 sec using belt and FM ?Bridge 2 x 10 ?90-90 alternating heel tap 2 x 10 ?Side clamshell with red 2 x 10 each ?Manual: ?Skilled palpation and monitoring of muscle tension while performing TPDN treatment ?STM right upper trap and lumbar QL regions ?Trigger Point Dry Needling Treatment: ?Pre-treatment instruction: Patient instructed on dry needling rationale, procedures, and possible side effects including pain during treatment (achy,cramping feeling), bruising, drop of blood,  lightheadedness, nausea, sweating. ?Patient Consent Given: Yes ?Education handout provided: No ?Muscles treated: Right upper trap, right lumbar multifidi and QL  ?Needle size and number: .30x65m x 2, .30x716mx 1, .40x105 x 1

## 2021-11-29 ENCOUNTER — Other Ambulatory Visit (HOSPITAL_COMMUNITY)
Admission: RE | Admit: 2021-11-29 | Discharge: 2021-11-29 | Disposition: A | Discharge status: Home / Self Care | Payer: BC Managed Care – PPO | Source: Ambulatory Visit | Attending: Family Medicine | Admitting: Family Medicine

## 2021-11-29 ENCOUNTER — Ambulatory Visit: Payer: BC Managed Care – PPO | Attending: Family Medicine | Admitting: Family Medicine

## 2021-11-29 ENCOUNTER — Encounter: Payer: Self-pay | Admitting: Family Medicine

## 2021-11-29 VITALS — BP 113/77 | HR 88 | Ht 61.0 in | Wt 187.2 lb

## 2021-11-29 DIAGNOSIS — Z23 Encounter for immunization: Secondary | ICD-10-CM | POA: Diagnosis not present

## 2021-11-29 DIAGNOSIS — Z124 Encounter for screening for malignant neoplasm of cervix: Secondary | ICD-10-CM

## 2021-11-29 DIAGNOSIS — Z0000 Encounter for general adult medical examination without abnormal findings: Secondary | ICD-10-CM

## 2021-11-29 DIAGNOSIS — N3000 Acute cystitis without hematuria: Secondary | ICD-10-CM

## 2021-11-29 DIAGNOSIS — Z1231 Encounter for screening mammogram for malignant neoplasm of breast: Secondary | ICD-10-CM

## 2021-11-29 DIAGNOSIS — Z0001 Encounter for general adult medical examination with abnormal findings: Secondary | ICD-10-CM | POA: Diagnosis not present

## 2021-11-29 DIAGNOSIS — Z1159 Encounter for screening for other viral diseases: Secondary | ICD-10-CM | POA: Diagnosis not present

## 2021-11-29 DIAGNOSIS — Z1211 Encounter for screening for malignant neoplasm of colon: Secondary | ICD-10-CM

## 2021-11-29 LAB — POCT URINALYSIS DIP (CLINITEK)
Bilirubin, UA: NEGATIVE
Glucose, UA: NEGATIVE mg/dL
Ketones, POC UA: NEGATIVE mg/dL
Nitrite, UA: NEGATIVE
POC PROTEIN,UA: NEGATIVE
Spec Grav, UA: 1.02 (ref 1.010–1.025)
Urobilinogen, UA: 0.2 E.U./dL
pH, UA: 6 (ref 5.0–8.0)

## 2021-11-29 MED ORDER — NITROFURANTOIN MONOHYD MACRO 100 MG PO CAPS: 100.0000 mg | ORAL_CAPSULE | Freq: Two times a day (BID) | ORAL | 0 refills | Status: DC

## 2021-11-29 NOTE — Addendum Note (Signed)
Addended by: Gomez Cleverly on: 11/29/2021 12:09 PM ? ? Modules accepted: Orders ? ?

## 2021-11-29 NOTE — Progress Notes (Signed)
? ?Subjective:  ?Patient ID: Krista Campbell, female    DOB: October 24, 1970  Age: 51 y.o. MRN: [id number] ? ?CC: Annual Exam and Gynecologic Exam ? ? ?HPI ?Krista Campbell is a 51 y.o. year old female with a history of type 2 diabetes mellitus (A1c 6.1), hyperlipidemia, obesity. ?She presents for an annual physical exam today. ? ?Interval History: ?Today she is due for breast cancer screening, cervical cancer screening, colorectal cancer screening.  She is also not up-to-date on her annual eye exams. ?Compliant with a diabetic diet. ? ?Also has low back pain for which she is under the care of orthopedics and also undergoing PT.  She was scheduled for an epidural spinal injection but was told she needed an MRI prior to this and she is unable to afford her deductible for the MRI at this time. ? ?She also complains of vaginal burning and itching ?Past Medical History:  ?Diagnosis Date  ? Diabetes mellitus without complication ([place])   ? Hyperlipidemia   ? ? ?History reviewed. No pertinent surgical history. ? ?Family History  ?Problem Relation Age of Onset  ? Kidney disease Father   ? ? ?Social History  ? ?Socioeconomic History  ? Marital status: Significant Other  ?  Spouse name: Not on file  ? Number of children: Not on file  ? Years of education: Not on file  ? Highest education level: Not on file  ?Occupational History  ? Not on file  ?Tobacco Use  ? Smoking status: Never  ? Smokeless tobacco: Never  ?Vaping Use  ? Vaping Use: Never used  ?Substance and Sexual Activity  ? Alcohol use: No  ? Drug use: No  ? Sexual activity: Not on file  ?Other Topics Concern  ? Not on file  ?Social History Narrative  ? Not on file  ? ?Social Determinants of Health  ? ?Financial Resource Strain: Not on file  ?Food Insecurity: Not on file  ?Transportation Needs: Not on file  ?Physical Activity: Not on file  ?Stress: Not on file  ?Social Connections: Not on file  ? ? ?Allergies  ?Allergen Reactions  ? Metformin And Related Diarrhea   ? ? ?Outpatient Medications Prior to Visit  ?Medication Sig Dispense Refill  ? atorvastatin (LIPITOR) 20 MG tablet TAKE 1 TABLET BY MOUTH DAILY AT 6 PM. 30 tablet 6  ? Bayer Microlet Lancets lancets Use to check blood sugar 3 times daily as directed. E11.9. Z79.4 100 each 11  ? Blood Glucose Monitoring Suppl (CONTOUR BLOOD GLUCOSE SYSTEM) DEVI Use to check blood sugar 3 times daily as directed. E11.9. Z79.4 1 each 0  ? clotrimazole-betamethasone (LOTRISONE) cream Apply 1 application topically 2 (two) times daily. 30 g 1  ? cyclobenzaprine (FLEXERIL) 10 MG tablet Take 1 tablet (10 mg total) by mouth 3 (three) times daily as needed for muscle spasms. 60 tablet 3  ? glucose blood test strip Use to check blood sugar 3 times daily as directed. E11.9. Z79.4 100 each 12  ? Insulin Pen Needle (TRUEPLUS PEN NEEDLES) 32G X 4 MM MISC USE AS DIRECTED 100 each 11  ? Insulin Syringe-Needle U-100 (BD INSULIN SYRINGE ULTRAFINE) 31G X 15/64" 0.5 ML MISC USE AS DIRECTED 100 each 12  ? Magnesium 400 MG TABS Take 400 mg by mouth 2 (two) times daily. 60 tablet 2  ? Magnesium 500 MG TABS Take by mouth.    ? metroNIDAZOLE (METROGEL VAGINAL) 0.75 % vaginal gel Place 1 Applicatorful vaginally at bedtime. 70 g 0  ?  naproxen (NAPROSYN) 500 MG tablet Take 1 tablet (500 mg total) by mouth 2 (two) times daily with a meal. 30 tablet 3  ? Semaglutide,0.25 or 0.'5MG'$ /DOS, (OZEMPIC, 0.25 OR 0.5 MG/DOSE,) 2 MG/1.5ML SOPN INJECT 0.5 MG UNDER THE SKIN ONCE A WEEK 1.5 mL 6  ? SEMGLEE, YFGN, 100 UNIT/ML Pen INJECT 36 UNITS SUBCUTANEOUS DAILY 15 mL 1  ? ?No facility-administered medications prior to visit.  ? ? ? ?ROS ?Review of Systems  ?Constitutional:  Negative for activity change, appetite change and fatigue.  ?HENT:  Negative for congestion, sinus pressure and sore throat.   ?Eyes:  Negative for visual disturbance.  ?Respiratory:  Negative for cough, chest tightness, shortness of breath and wheezing.   ?Cardiovascular:  Negative for chest pain and  palpitations.  ?Gastrointestinal:  Negative for abdominal distention, abdominal pain and constipation.  ?Endocrine: Negative for polydipsia.  ?Genitourinary:  Negative for dysuria and frequency.  ?Musculoskeletal:  Positive for back pain. Negative for arthralgias.  ?Skin:  Negative for rash.  ?Neurological:  Negative for tremors, light-headedness and numbness.  ?Hematological:  Does not bruise/bleed easily.  ?Psychiatric/Behavioral:  Negative for agitation and behavioral problems.   ? ?Objective:  ?BP 113/77   Pulse 88   Ht '5\' 1"'$  (1.549 m)   Wt 187 lb 3.2 oz (84.9 kg)   SpO2 100%   BMI 35.37 kg/m?  ? ? ?  11/29/2021  ? 10:32 AM 10/11/2021  ?  4:27 PM 10/03/2021  ?  9:48 AM  ?BP/Weight  ?Systolic BP 510 258 527  ?Diastolic BP 77 74 76  ?Wt. (Lbs) 187.2 184.8   ?BMI 35.37 kg/m2 34.92 kg/m2   ? ? ? ? ?Physical Exam ?Exam conducted with a chaperone present.  ?Constitutional:   ?   General: She is not in acute distress. ?   Appearance: She is well-developed. She is not diaphoretic.  ?HENT:  ?   Head: Normocephalic.  ?   Right Ear: External ear normal.  ?   Left Ear: External ear normal.  ?   Nose: Nose normal.  ?Eyes:  ?   Conjunctiva/sclera: Conjunctivae normal.  ?   Pupils: Pupils are equal, round, and reactive to light.  ?Neck:  ?   Vascular: No JVD.  ?Cardiovascular:  ?   Rate and Rhythm: Normal rate and regular rhythm.  ?   Heart sounds: Normal heart sounds. No murmur heard. ?  No gallop.  ?Pulmonary:  ?   Effort: Pulmonary effort is normal. No respiratory distress.  ?   Breath sounds: Normal breath sounds. No wheezing or rales.  ?Chest:  ?   Chest wall: No tenderness.  ?Breasts: ?   Right: Normal. No mass, nipple discharge or tenderness.  ?   Left: Normal. No mass, nipple discharge or tenderness.  ?Abdominal:  ?   General: Bowel sounds are normal. There is no distension.  ?   Palpations: Abdomen is soft. There is no mass.  ?   Tenderness: There is no abdominal tenderness.  ?   Hernia: There is no hernia in the  left inguinal area or right inguinal area.  ?Genitourinary: ?   General: Normal vulva.  ?   Pubic Area: No rash.   ?   Labia:     ?   Right: No rash.     ?   Left: No rash.   ?   Vagina: Vaginal discharge present.  ?   Cervix: Normal.  ?   Uterus: Normal.   ?  Adnexa: Right adnexa normal and left adnexa normal.    ?   Right: No tenderness.      ?   Left: No tenderness.    ?Musculoskeletal:     ?   General: No tenderness. Normal range of motion.  ?   Cervical back: Normal range of motion. No tenderness.  ?Lymphadenopathy:  ?   Upper Body:  ?   Right upper body: No supraclavicular or axillary adenopathy.  ?   Left upper body: No supraclavicular or axillary adenopathy.  ?Skin: ?   General: Skin is warm and dry.  ?Neurological:  ?   Mental Status: She is alert and oriented to person, place, and time.  ?   Deep Tendon Reflexes: Reflexes are normal and symmetric.  ? ? ? ?  Latest Ref Rng & Units 10/16/2021  ?  8:35 AM 12/05/2020  ? 12:02 PM 08/26/2019  ?  2:49 PM  ?CMP  ?Glucose 70 - 99 mg/dL 65   229   140    ?BUN 6 - 24 mg/dL '13   9   9    '$ ?Creatinine 0.57 - 1.00 mg/dL 0.93   0.93   0.88    ?Sodium 134 - 144 mmol/L 136   140   138    ?Potassium 3.5 - 5.2 mmol/L 3.9   4.7   3.6    ?Chloride 96 - 106 mmol/L 98   97   97    ?CO2 20 - 29 mmol/L '24   26   27    '$ ?Calcium 8.7 - 10.2 mg/dL 9.8   10.0   9.7    ?Total Protein 6.0 - 8.5 g/dL 7.3   7.8   7.2    ?Total Bilirubin 0.0 - 1.2 mg/dL 0.5   0.5   0.7    ?Alkaline Phos 44 - 121 IU/L 141   157   92    ?AST 0 - 40 IU/L 29   41   19    ?ALT 0 - 32 IU/L 25   78   15    ? ? ?Lipid Panel  ?   ?Component Value Date/Time  ? CHOL 203 (H) 10/16/2021 0835  ? TRIG 156 (H) 10/16/2021 0835  ? HDL 69 10/16/2021 0835  ? CHOLHDL 3.8 12/05/2020 1202  ? CHOLHDL 2.2 10/19/2015 1001  ? VLDL 9 10/19/2015 1001  ? LDLCALC 107 (H) 10/16/2021 0835  ? ? ?CBC ?   ?Component Value Date/Time  ? WBC 7.5 10/16/2021 0835  ? WBC 4.9 10/19/2015 1001  ? RBC 4.05 10/16/2021 0835  ? RBC 4.17 10/19/2015 1001  ? HGB  11.3 10/16/2021 0835  ? HCT 35.5 10/16/2021 0835  ? PLT 286 10/16/2021 0835  ? MCV 88 10/16/2021 0835  ? MCH 27.9 10/16/2021 0835  ? MCH 28.3 10/19/2015 1001  ? MCHC 31.8 10/16/2021 0835  ? MCHC 32.2 10/19/2015 1001

## 2021-11-29 NOTE — Patient Instructions (Signed)

## 2021-11-29 NOTE — Progress Notes (Signed)
Physical/pap ?

## 2021-11-30 LAB — HCV INTERPRETATION

## 2021-11-30 LAB — HCV AB W REFLEX TO QUANT PCR: HCV Ab: NONREACTIVE

## 2021-12-01 LAB — CYTOLOGY - PAP
Comment: NEGATIVE
Diagnosis: NEGATIVE
High risk HPV: NEGATIVE

## 2021-12-02 ENCOUNTER — Other Ambulatory Visit: Payer: Self-pay | Admitting: Family Medicine

## 2021-12-02 MED ORDER — METRONIDAZOLE 500 MG PO TABS: 2000.0000 mg | ORAL_TABLET | Freq: Once | ORAL | 0 refills | Status: AC

## 2021-12-05 ENCOUNTER — Telehealth: Payer: Self-pay | Admitting: Family Medicine

## 2021-12-05 ENCOUNTER — Encounter: Payer: Self-pay | Admitting: *Deleted

## 2021-12-05 MED ORDER — METRONIDAZOLE 500 MG PO TABS
500.0000 mg | ORAL_TABLET | Freq: Two times a day (BID) | ORAL | 0 refills | Status: AC
Start: 2021-12-05 — End: 2021-12-12

## 2021-12-05 NOTE — Telephone Encounter (Signed)
Copied from [place] [id number]. Topic: General - Other ?>> Dec 05, 2021 10:07 AM Leward Quan A wrote: ?Reason for CRM: Patient called in to inquire of Dr Margarita Rana that she was given some medication metroNIDAZOLE (FLAGYL) 500 MG tablet and say that she have been taking one pill at a time also stated that she have never taken the nitrofurantoin, macrocrystal-monohydrate, (MACROBID) 100 MG capsule because they are capsules and she would prefer the cream instead of the capsules. Also patient want to know if she should just finish taking the 2 metroNIDAZOLE (FLAGYL) 500 MG tablet that she still have left or should she get a new Rx to take all 4 tabs at one time.  Ph# [phone] ?

## 2021-12-05 NOTE — Telephone Encounter (Signed)
Patient returned call regarding flagyl Rx. Reviewed message from Dr. Margarita Rana with patient informing her that a new prescription for metronidazole 1 tablet twice daily which needs to be taken for total of 7 days since she did not take all 4 tablets of the metronidazole at once. The nitrofurantion was prescribed for UTI. We do not have any cream to treat UTIs. Patient verbalized understanding but is asking is she should take the remaining 2 pills from the first prescription tonight at this same time until she can go to pharmacy and see if she can afford the new prescription for metronidazole for 7 days. Patient reports she does not have the money until Friday.  Patient may not be able to start new Rx until Friday. Will go today after work to see if she can pay for new Rx. Please advise . ?

## 2021-12-05 NOTE — Telephone Encounter (Signed)
Please inform her that I have sent a new prescription for metronidazole 1 tablet twice daily which needs to be taken for total of 7 days since she did not take all 4 tablets of the metronidazole at once.  The nitrofurantoin was prescribed for UTI.  We do not have any cream to treat UTIs.   ?

## 2021-12-05 NOTE — Telephone Encounter (Signed)
This encounter was created in error - please disregard.

## 2021-12-06 ENCOUNTER — Encounter (HOSPITAL_BASED_OUTPATIENT_CLINIC_OR_DEPARTMENT_OTHER): Payer: Self-pay | Admitting: Physical Therapy

## 2021-12-06 ENCOUNTER — Ambulatory Visit (HOSPITAL_BASED_OUTPATIENT_CLINIC_OR_DEPARTMENT_OTHER): Payer: BC Managed Care – PPO | Attending: Family Medicine | Admitting: Physical Therapy

## 2021-12-06 DIAGNOSIS — M5412 Radiculopathy, cervical region: Secondary | ICD-10-CM | POA: Insufficient documentation

## 2021-12-06 DIAGNOSIS — M79601 Pain in right arm: Secondary | ICD-10-CM

## 2021-12-06 DIAGNOSIS — M6281 Muscle weakness (generalized): Secondary | ICD-10-CM

## 2021-12-06 DIAGNOSIS — R293 Abnormal posture: Secondary | ICD-10-CM

## 2021-12-06 DIAGNOSIS — M542 Cervicalgia: Secondary | ICD-10-CM

## 2021-12-06 DIAGNOSIS — G8929 Other chronic pain: Secondary | ICD-10-CM

## 2021-12-06 NOTE — Therapy (Signed)
?OUTPATIENT PHYSICAL THERAPY TREATMENT NOTE ? ? ?Patient Name: ZYANYA GLAZA ?MRN: [id number] ?DOB:Jul 21, 1971, 51 y.o., female ?Today's Date: 12/06/2021 ? ?PCP: Charlott Rakes, MD ?REFERRING PROVIDER: Charlott Rakes, MD ? ? PT End of Session - 12/06/21 0827   ? ? Visit Number 6   ? Number of Visits 8   ? Date for PT Re-Evaluation 12/13/21   ? Authorization Type [employer]   ? PT Start Time [phone]   Pt slightly late for appointment. 1st session at pool.  ? PT Stop Time 0906   ? PT Time Calculation (min) 38 min   ? Activity Tolerance Patient tolerated treatment well   ? Behavior During Therapy [facility] for tasks assessed/performed   ? ?  ?  ? ?  ? ? ? ? ? ?Past Medical History:  ?Diagnosis Date  ? Diabetes mellitus without complication ([place])   ? Hyperlipidemia   ? ?History reviewed. No pertinent surgical history. ?Patient Active Problem List  ? Diagnosis Date Noted  ? Obesity (BMI 35.0-39.9 without comorbidity) 03/24/2018  ? HLD (hyperlipidemia) 05/21/2014  ? Hypomagnesemia 05/21/2014  ? DKA (diabetic ketoacidoses) 05/19/2014  ? Hyponatremia 05/19/2014  ? Hypokalemia 05/19/2014  ? DM (diabetes mellitus) ([place]) 05/19/2014  ? Dehydration 05/19/2014  ? ? ?REFERRING PROVIDER: Charlott Rakes, MD ?  ?REFERRING DIAG: Cervical radiculopathy ? ?THERAPY DIAG:  ?Chronic bilateral low back pain, unspecified whether sciatica present ? ?Pain in right arm ? ?Cervicalgia ? ?Muscle weakness (generalized) ? ?Abnormal posture ? ?PERTINENT HISTORY: DM ? ?PRECAUTIONS: None ? ?SUBJECTIVE: "Worked all night, pain in back increased, I'm a little nervous about the pool" ? ?PAIN:  ?Are you having pain? Yes ?NPRS scale: 6/10 ?Pain location: Low back ?Pain orientation: Bilateral (left > right) ?PAIN TYPE: Chronic ?Pain description: Constant, tight ?Aggravating factors: Inactivity, lying down, sitting extended periods ?Relieving factors: Medication ? ?PATIENT GOALS: Pain relief so can do all things around home and work without hurting ? ? ?OBJECTIVE:  (measures assessed at evaluation unless otherwise noted) ?PATIENT SURVEYS:  ?FOTO 68% functional status (neck) ?  ?POSTURE:  ?Rounded shoulder and forward head posture ?  ?PALPATION: ?Tender to palpation with trigger points noted right upper trap region, infraspinatus       ?  ?CERVICAL AROM: ?  ?AROM AROM (deg) ?10/18/2021  ?Flexion 60  ?Extension 55  ?Right lateral flexion 40  ?Left lateral flexion 35  ?Right rotation 75  ?Left rotation 75*  ?Patient reports pulling right upper trap with left side bend and bilateral rotation ?  ?LUMBAR AROM: ?  ?A/PROM AROM  ?10/18/2021  ?11/07/2021  ?Flexion [facility] WFL - reduced stiffness  ?Extension WFL   ?Right lateral flexion WFL   ?Left lateral flexion WFL   ?Right rotation WFL   ?Left rotation  WFL   ?Patient reports lumbar sitffness/pain with flexion, side bend, and rotation ?  ?UE AROM: ?  ?AROM Right ?10/18/2021 Left ?10/18/2021 Right ?10/27/2021  ?Shoulder flexion 110 175 170  ?Shoulder internal rotation T6 T6   ?Shoulder external rotation T2 T4   ?Patient limited on right due to pain ?  ?UE MMT: ?  ?MMT Right ?10/18/2021 Left ?10/18/2021  ?Shoulder flexion 4 5  ?Shoulder extension 4 5  ?Shoulder abduction 4 5  ?Shoulder internal rotation 4+ 5  ?Shoulder external rotation 4 5  ?Periscapular musculature 4- 4-  ?Elbow flexion 5 5  ?Elbow extension 5 5  ?Grip strength 50 52  ?Patient reports right shoulder pain with all right sided muscle strength testing ?  ?  Patient with upper trap compensation with periscapular muscle testing ? ?L MMT: ?  ?MMT Right ?11/27/2021 Left ?11/27/2021  ?Hip flexion 4 4  ?Hip extension 4- 4-  ?Hip abduction 4- 4-  ? ?  ? ?TODAY'S TREATMENT:  ? ?[place] Adult PT Treatment:                                                DATE: 11/14/2021 ?Aquatic ? ?Pt seen for aquatic therapy today.  Treatment took place in water 3.25-4.8 ft in depth at the [employer] pool. Temp of water was 91?.  Pt entered/exited the pool via stairs step through pattern independently with  bilat rail. ? ?Introduction to setting ?EDU on properties of water and benefits of aquatic therapy ? ?Stretching ?- Lumbar using kick board flex (hip hinge position) and rotation holding x 20s ?-holding to wall hip flex with abd R/L x10 ? ?Strengthening: abdominal bracing, glute isometrics ?- push pull kick board 3x15.  Cues for techniques to increase/decrease resistance to comfort. ?-1 foam hand buoys submerged walking 4 widths forward.  Cues for depressed shoulder position ?-Hip circles in hip flex then abd CW and CCW x15ea ?-kick board push downs 2x15.  ? ?Spinal distraction using yellow noodle supported under arms feet suspended floating x3-4 minutes then gentle lumbar rotation to create space and movement x10. ? ?Pt requires buoyancy for support and to offload joints with strengthening exercises. Viscosity of the water is needed for resistance of strengthening; water current perturbations provides challenge to standing balance unsupported, requiring increased core activation. ? ? ? ? ? ? ? ? ?[place] Adult PT Treatment:                                                DATE: 11/27/2021 ?Therapeutic Exercise: ?NuStep L6 x 5 min with UE/LE while taking subjective ?LTR 10 x 5 sec ?Piriformis stretch 2 x 20 sec each ?Figure-4 LTR 3 x 5 sec each ?Child's pose stretch 3 x 20 sec each side ?Bridge 10 x 3 sec ?Hooklying clamshell with blue 10 x 3 sec ?Sidelying hip abduction 2 x 10 ?Manual: ?Skilled palpation and monitoring of muscle tension while performing TPDN treatment ?STM lumbar paraspinals ?Trigger Point Dry Needling Treatment: ?Pre-treatment instruction: Patient instructed on dry needling rationale, procedures, and possible side effects including pain during treatment (achy,cramping feeling), bruising, drop of blood, lightheadedness, nausea, sweating. ?Patient Consent Given: Yes ?Education handout provided: No ?Muscles treated: Left lumbar multifidi  ?Needle size and number: .30x69m x 3 ?Electrical stimulation  performed: No ?Parameters: N/A ?Treatment response/outcome: Twitch response elicited, Palpable decrease in muscle tension, and patient reports improvement in symptoms ?Post-treatment instructions: Patient instructed to expect possible mild to moderate muscle soreness later today and/or tomorrow. Patient instructed in methods to reduce muscle soreness and to continue prescribed HEP. If patient was dry needled over the lung field, patient was instructed on signs and symptoms of pneumothorax and, however unlikely, to see immediate medical attention should they occur. Patient was also educated on signs and symptoms of infection and to seek medical attention should they occur. Patient verbalized understanding of these instructions and education. ? ? ? [facility]  [facility] ?Therapeutic Exercise: ?NuStep L6 x 5 min with UE/LE while taking subjective ?LTR 10 x 5 sec ?Seated lumbar flexion stretch physioball roll out 5 x 5 sec forward, 3 x 5 sec each side ?Sidelying thoracolumbar rotation 5 x 5 sec each ?Cat cow 5 x 5 sec ?Posterior pelvic tilt 10 x 5 sec ?Manual: ?Skilled palpation and monitoring of muscle tension while performing TPDN treatment ?STM lumbar paraspinals ?Trigger Point Dry Needling Treatment: ?Pre-treatment instruction: Patient instructed on dry needling rationale, procedures, and possible side effects including pain during treatment (achy,cramping feeling), bruising, drop of blood, lightheadedness, nausea, sweating. ?Patient Consent Given: Yes ?Education handout provided: No ?Muscles treated: Bilat lumbar multifidi  ?Needle size and number: .30x95m x 4 ?Electrical stimulation performed: Yes ?Parameters: Milli 4 frequency intensity to patient tolerance x 10 minutes ?Treatment response/outcome: Twitch response elicited, Palpable decrease in muscle tension, and patient demonstrates improved motion with less pain ?Post-treatment instructions:  Patient instructed to expect possible mild to moderate muscle soreness later today and/or tomorrow. Patient instructed in methods to reduce muscle soreness and to continue prescribed HEP. If patient was dry needled

## 2021-12-06 NOTE — Therapy (Incomplete)
?OUTPATIENT PHYSICAL THERAPY TREATMENT NOTE ? ? ?Patient Name: Krista Campbell ?MRN: [id number] ?DOB:12-30-1970, 51 y.o., female ?Today's Date: 12/06/2021 ? ?PCP: Charlott Rakes, MD ?REFERRING PROVIDER: Charlott Rakes, MD ? ? ? ? ? ? ? ?Past Medical History:  ?Diagnosis Date  ? Diabetes mellitus without complication ([place])   ? Hyperlipidemia   ? ?No past surgical history on file. ?Patient Active Problem List  ? Diagnosis Date Noted  ? Obesity (BMI 35.0-39.9 without comorbidity) 03/24/2018  ? HLD (hyperlipidemia) 05/21/2014  ? Hypomagnesemia 05/21/2014  ? DKA (diabetic ketoacidoses) 05/19/2014  ? Hyponatremia 05/19/2014  ? Hypokalemia 05/19/2014  ? DM (diabetes mellitus) ([place]) 05/19/2014  ? Dehydration 05/19/2014  ? ? ?REFERRING PROVIDER: Charlott Rakes, MD ?  ?REFERRING DIAG: Cervical radiculopathy ? ?THERAPY DIAG:  ?No diagnosis found. ? ?PERTINENT HISTORY: DM ? ?PRECAUTIONS: None ? ?SUBJECTIVE: Patient reports she is hurting a little bit more today, left lower back discomfort. She missed PT last week due to being out of town and she could tell. ? ?PAIN:  ?Are you having pain? Yes ?NPRS scale: 5/10 ?Pain location: Low back ?Pain orientation: Bilateral (left > right) ?PAIN TYPE: Chronic ?Pain description: Constant, tight ?Aggravating factors: Inactivity, lying down, sitting extended periods ?Relieving factors: Medication ? ?PATIENT GOALS: Pain relief so can do all things around home and work without hurting ? ? ?OBJECTIVE: (measures assessed at evaluation unless otherwise noted) ?PATIENT SURVEYS:  ?FOTO 68% functional status (neck) ?  ?POSTURE:  ?Rounded shoulder and forward head posture ?  ?PALPATION: ?Tender to palpation with trigger points noted right upper trap region, infraspinatus       ?  ?CERVICAL AROM: ?  ?AROM AROM (deg) ?10/18/2021  ?Flexion 60  ?Extension 55  ?Right lateral flexion 40  ?Left lateral flexion 35  ?Right rotation 75  ?Left rotation 75*  ?Patient reports pulling right upper trap with left  side bend and bilateral rotation ?  ?LUMBAR AROM: ?  ?A/PROM AROM  ?10/18/2021  ?11/07/2021  ?Flexion [facility] WFL - reduced stiffness  ?Extension WFL   ?Right lateral flexion WFL   ?Left lateral flexion WFL   ?Right rotation WFL   ?Left rotation  WFL   ?Patient reports lumbar sitffness/pain with flexion, side bend, and rotation ?  ?UE AROM: ?  ?AROM Right ?10/18/2021 Left ?10/18/2021 Right ?10/27/2021  ?Shoulder flexion 110 175 170  ?Shoulder internal rotation T6 T6   ?Shoulder external rotation T2 T4   ?Patient limited on right due to pain ?  ?UE MMT: ?  ?MMT Right ?10/18/2021 Left ?10/18/2021  ?Shoulder flexion 4 5  ?Shoulder extension 4 5  ?Shoulder abduction 4 5  ?Shoulder internal rotation 4+ 5  ?Shoulder external rotation 4 5  ?Periscapular musculature 4- 4-  ?Elbow flexion 5 5  ?Elbow extension 5 5  ?Grip strength 50 52  ?Patient reports right shoulder pain with all right sided muscle strength testing ?  ?Patient with upper trap compensation with periscapular muscle testing ? ?L MMT: ?  ?MMT Right ?11/27/2021 Left ?11/27/2021  ?Hip flexion 4 4  ?Hip extension 4- 4-  ?Hip abduction 4- 4-  ? ?  ? ?TODAY'S TREATMENT:  ?[facility] Adult PT Treatment:                                                DATE: 12/11/2021 ?Therapeutic Exercise: ?NuStep L6 x 5 min  with UE/LE while taking subjective ?LTR 10 x 5 sec ?Piriformis stretch 2 x 20 sec each ?Figure-4 LTR 3 x 5 sec each ?Child's pose stretch 3 x 20 sec each side ?Bridge 10 x 3 sec ?Hooklying clamshell with blue 10 x 3 sec ?Sidelying hip abduction 2 x 10 ?Manual: ?Skilled palpation and monitoring of muscle tension while performing TPDN treatment ?STM lumbar paraspinals ?Trigger Point Dry Needling Treatment: ?Pre-treatment instruction: Patient instructed on dry needling rationale, procedures, and possible side effects including pain during treatment (achy,cramping feeling), bruising, drop of blood, lightheadedness, nausea, sweating. ?Patient Consent Given: Yes ?Education handout provided:  No ?Muscles treated: Left lumbar multifidi  ?Needle size and number: .30x97m x 3 ?Electrical stimulation performed: No ?Parameters: N/A ?Treatment response/outcome: Twitch response elicited, Palpable decrease in muscle tension, and patient reports improvement in symptoms ?Post-treatment instructions: Patient instructed to expect possible mild to moderate muscle soreness later today and/or tomorrow. Patient instructed in methods to reduce muscle soreness and to continue prescribed HEP. If patient was dry needled over the lung field, patient was instructed on signs and symptoms of pneumothorax and, however unlikely, to see immediate medical attention should they occur. Patient was also educated on signs and symptoms of infection and to seek medical attention should they occur. Patient verbalized understanding of these instructions and education. ? ? [facility]                                                [facility]: [facility] ?Therapeutic Exercise: ?NuStep L6 x 5 min with UE/LE while taking subjective ?LTR 10 x 5 sec ?Piriformis stretch 2 x 20 sec each ?Figure-4 LTR 3 x 5 sec each ?Child's pose stretch 3 x 20 sec each side ?Bridge 10 x 3 sec ?Hooklying clamshell with blue 10 x 3 sec ?Sidelying hip abduction 2 x 10 ?Manual: ?Skilled palpation and monitoring of muscle tension while performing TPDN treatment ?STM lumbar paraspinals ?Trigger Point Dry Needling Treatment: ?Pre-treatment instruction: Patient instructed on dry needling rationale, procedures, and possible side effects including pain during treatment (achy,cramping feeling), bruising, drop of blood, lightheadedness, nausea, sweating. ?Patient Consent Given: Yes ?Education handout provided: No ?Muscles treated: Left lumbar multifidi  ?Needle size and number: .30x769mx 3 ?Electrical stimulation performed: No ?Parameters: N/A ?Treatment response/outcome: Twitch response elicited, Palpable decrease in muscle tension, and patient reports improvement in  symptoms ?Post-treatment instructions: Patient instructed to expect possible mild to moderate muscle soreness later today and/or tomorrow. Patient instructed in methods to reduce muscle soreness and to continue prescribed HEP. If patient was dry needled over the lung field, patient was instructed on signs and symptoms of pneumothorax and, however unlikely, to see immediate medical attention should they occur. Patient was also educated on signs and symptoms of infection and to seek medical attention should they occur. Patient verbalized understanding of these instructions and education. ? ?[facility]                                                DATE: 11[facility]14[facility] ?Therapeutic Exercise: ?NuStep L6 x 5 min with UE/LE while taking subjective ?LTR 10 x 5 sec ?Seated lumbar flexion stretch physioball roll out 5 x 5 sec forward, 3 x 5  sec each side ?Sidelying thoracolumbar rotation 5 x 5 sec each ?Cat cow 5 x 5 sec ?Posterior pelvic tilt 10 x 5 sec ?Manual: ?Skilled palpation and monitoring of muscle tension while performing TPDN treatment ?STM lumbar paraspinals ?Trigger Point Dry Needling Treatment: ?Pre-treatment instruction: Patient instructed on dry needling rationale, procedures, and possible side effects including pain during treatment (achy,cramping feeling), bruising, drop of blood, lightheadedness, nausea, sweating. ?Patient Consent Given: Yes ?Education handout provided: No ?Muscles treated: Bilat lumbar multifidi  ?Needle size and number: .30x70m x 4 ?Electrical stimulation performed: Yes ?Parameters: Milli 4 frequency intensity to patient tolerance x 10 minutes ?Treatment response/outcome: Twitch response elicited, Palpable decrease in muscle tension, and patient demonstrates improved motion with less pain ?Post-treatment instructions: Patient instructed to expect possible mild to moderate muscle soreness later today and/or tomorrow. Patient instructed in methods to reduce muscle soreness and to  continue prescribed HEP. If patient was dry needled over the lung field, patient was instructed on signs and symptoms of pneumothorax and, however unlikely, to see immediate medical attention should they

## 2021-12-06 NOTE — Telephone Encounter (Signed)
Call returned to patient. Offered clarification that the patient can pick-up the Flagyl rx on Friday and begin taking as rx'd (500 mg BID).  ?

## 2021-12-07 ENCOUNTER — Ambulatory Visit (HOSPITAL_BASED_OUTPATIENT_CLINIC_OR_DEPARTMENT_OTHER): Payer: BC Managed Care – PPO | Admitting: Physical Therapy

## 2021-12-11 ENCOUNTER — Ambulatory Visit: Payer: BC Managed Care – PPO | Attending: Family Medicine | Admitting: Physical Therapy

## 2021-12-11 DIAGNOSIS — M6281 Muscle weakness (generalized): Secondary | ICD-10-CM | POA: Insufficient documentation

## 2021-12-11 DIAGNOSIS — M545 Low back pain, unspecified: Secondary | ICD-10-CM | POA: Insufficient documentation

## 2021-12-11 DIAGNOSIS — R293 Abnormal posture: Secondary | ICD-10-CM | POA: Insufficient documentation

## 2021-12-11 DIAGNOSIS — M542 Cervicalgia: Secondary | ICD-10-CM | POA: Insufficient documentation

## 2021-12-11 DIAGNOSIS — G8929 Other chronic pain: Secondary | ICD-10-CM | POA: Insufficient documentation

## 2021-12-11 DIAGNOSIS — M79601 Pain in right arm: Secondary | ICD-10-CM | POA: Insufficient documentation

## 2021-12-13 ENCOUNTER — Telehealth: Payer: Self-pay | Admitting: Physical Therapy

## 2021-12-13 NOTE — Telephone Encounter (Signed)
Attempted to contact patient due to missed PT appointment on 12/11/2021. Left VM informing patient of missed appointment and reminder of her next scheduled appointment. Advised patient of attendance policy. ? ?Krista Campbell, PT, DPT, LAT, ATC ?12/13/21  12:04 PM ?Phone: [phone] ?Fax: [phone] ? ?

## 2021-12-20 ENCOUNTER — Other Ambulatory Visit: Payer: Self-pay | Admitting: Family Medicine

## 2021-12-20 ENCOUNTER — Ambulatory Visit: Payer: BC Managed Care – PPO | Admitting: Physical Therapy

## 2021-12-20 DIAGNOSIS — Z794 Long term (current) use of insulin: Secondary | ICD-10-CM

## 2021-12-22 NOTE — Therapy (Signed)
?OUTPATIENT PHYSICAL THERAPY TREATMENT NOTE ? ? ?Patient Name: Krista Campbell ?MRN: [id number] ?DOB:10/06/1970, 51 y.o., female ?Today's Date: 12/25/2021 ? ?PCP: Charlott Rakes, MD ?REFERRING PROVIDER: Charlott Rakes, MD ? ? PT End of Session - 12/25/21 0841   ? ? Visit Number [phone]   ? Number of Visits 11   ? Date for PT Re-Evaluation 02/05/22   ? Authorization Type [employer]   ? PT Start Time [phone]   ? PT Stop Time 7846   ? PT Time Calculation (min) 38 min   ? Activity Tolerance Patient tolerated treatment well   ? Behavior During Therapy [facility] for tasks assessed/performed   ? ?  ?  ? ?  ? ? ? ? ? ? ?Past Medical History:  ?Diagnosis Date  ? Diabetes mellitus without complication ([place])   ? Hyperlipidemia   ? ?History reviewed. No pertinent surgical history. ?Patient Active Problem List  ? Diagnosis Date Noted  ? Obesity (BMI 35.0-39.9 without comorbidity) 03/24/2018  ? HLD (hyperlipidemia) 05/21/2014  ? Hypomagnesemia 05/21/2014  ? DKA (diabetic ketoacidoses) 05/19/2014  ? Hyponatremia 05/19/2014  ? Hypokalemia 05/19/2014  ? DM (diabetes mellitus) ([place]) 05/19/2014  ? Dehydration 05/19/2014  ? ? ?REFERRING PROVIDER: Charlott Rakes, MD ?  ?REFERRING DIAG: Cervical radiculopathy ? ?THERAPY DIAG:  ?Chronic bilateral low back pain, unspecified whether sciatica present ? ?Pain in right arm ? ?Cervicalgia ? ?Muscle weakness (generalized) ? ?Abnormal posture ? ?PERTINENT HISTORY: DM ? ?PRECAUTIONS: None ? ?SUBJECTIVE: Patient reports she is doing great. Denies any pain or discomfort this morning. She had to cancel her last pool therapy because her mother was sick. ? ?PAIN:  ?Are you having pain? No ?NPRS scale: 0/10 ?Pain location: Low back ?Pain orientation: Bilateral (left > right) ?PAIN TYPE: Chronic ?Pain description: Constant, tight ?Aggravating factors: Inactivity, lying down, sitting extended periods ?Relieving factors: Medication ? ?PATIENT GOALS: Pain relief so can do all things around home and work without  hurting ? ? ?OBJECTIVE: (measures assessed at evaluation unless otherwise noted) ?PATIENT SURVEYS:  ?FOTO 68% functional status (neck) ? 12/25/2021: 80% ?  ?POSTURE:  ?Rounded shoulder and forward head posture ?  ?PALPATION: ?Tender to palpation with trigger points noted right upper trap region, infraspinatus       ?  ?CERVICAL AROM: ?  ?AROM AROM (deg) ?10/18/2021  ?12/25/2021  ?Flexion 60   ?Extension 55   ?Right lateral flexion 40 45  ?Left lateral flexion 35 45  ?Right rotation 75 75  ?Left rotation 75* 75  ?  ?LUMBAR AROM: ?  ?A/PROM AROM  ?10/18/2021  ?11/07/2021  ?12/25/2021  ?Flexion [facility] WFL - reduced stiffness WFL  ?Extension WFL    ?Right lateral flexion WFL    ?Left lateral flexion WFL    ?Right rotation WFL    ?Left rotation  WFL    ?  ?UE AROM: ?  ?AROM Right ?10/18/2021 Left ?10/18/2021 Right ?10/27/2021 Right ?12/25/2021  ?Shoulder flexion 110 175 170 170  ?Shoulder internal rotation T6 T6    ?Shoulder external rotation T2 T4    ? ?UE MMT: ?  ?MMT Right ?10/18/2021 Left ?10/18/2021 Rt / Lt ?12/25/2021  ?Shoulder flexion 4 5 4+ / 5  ?Shoulder extension 4 5   ?Shoulder abduction 4 5 4+ / 5  ?Shoulder internal rotation 4+ 5   ?Shoulder external rotation 4 5   ?Periscapular musculature 4- 4- 4 / 4  ?Elbow flexion 5 5   ?Elbow extension 5 5   ?Grip strength 50 52   ?  Patient reports right shoulder pain with all right sided muscle strength testing ?  ?Patient with upper trap compensation with periscapular muscle testing ? ?L MMT: ?  ?MMT Right ?11/27/2021 Left ?11/27/2021 Rt / Lt ?12/25/2021  ?Hip flexion '4 4 4 '$ / 4  ?Hip extension 4- 4- 4- / 4-  ?Hip abduction 4- 4- 4- / 4-  ? ?  ? ?TODAY'S TREATMENT:  ?[facility] Adult PT Treatment:                                                DATE: 12/25/2021 ?Therapeutic Exercise: ?NuStep L6 x 5 min with UE/LE while taking subjective ?Piriformis stretch 2 x 20 sec each ?Figure-4 LTR 3 x 5 sec each ?Bridge 2 x 10 with 3 sec hold ?SLR 2 x 10 ?Sidelying hip abduction 2 x 10 ?Row with blue 2 x  20 ?Extension and scap retraction with blue 2 x 10 ?Double ER and scap retraction 2 x 10 ?Horizontal abduction with blue 2 x 10 ? ? ?[facility] Adult PT Treatment:                                                DATE: 11/27/2021 ?Therapeutic Exercise: ?NuStep L6 x 5 min with UE/LE while taking subjective ?LTR 10 x 5 sec ?Piriformis stretch 2 x 20 sec each ?Figure-4 LTR 3 x 5 sec each ?Child's pose stretch 3 x 20 sec each side ?Bridge 10 x 3 sec ?Hooklying clamshell with blue 10 x 3 sec ?Sidelying hip abduction 2 x 10 ?Manual: ?Skilled palpation and monitoring of muscle tension while performing TPDN treatment ?STM lumbar paraspinals ?Trigger Point Dry Needling Treatment: ?Pre-treatment instruction: Patient instructed on dry needling rationale, procedures, and possible side effects including pain during treatment (achy,cramping feeling), bruising, drop of blood, lightheadedness, nausea, sweating. ?Patient Consent Given: Yes ?Education handout provided: No ?Muscles treated: Left lumbar multifidi  ?Needle size and number: .30x50m x 3 ?Electrical stimulation performed: No ?Parameters: N/A ?Treatment response/outcome: Twitch response elicited, Palpable decrease in muscle tension, and patient reports improvement in symptoms ?Post-treatment instructions: Patient instructed to expect possible mild to moderate muscle soreness later today and/or tomorrow. Patient instructed in methods to reduce muscle soreness and to continue prescribed HEP. If patient was dry needled over the lung field, patient was instructed on signs and symptoms of pneumothorax and, however unlikely, to see immediate medical attention should they occur. Patient was also educated on signs and symptoms of infection and to seek medical attention should they occur. Patient verbalized understanding of these instructions and education. ? [facility]                                                [facility]: [facility] ?Therapeutic Exercise: ?NuStep L6 x 5 min  with UE/LE while taking subjective ?LTR 10 x 5 sec ?Seated lumbar flexion stretch physioball roll out 5 x 5 sec forward, 3 x 5 sec each side ?Sidelying thoracolumbar rotation 5 x 5 sec each ?Cat cow 5 x 5 sec ?Posterior pelvic tilt 10 x 5 sec ?Manual: ?Skilled palpation and monitoring  of muscle tension while performing TPDN treatment ?STM lumbar paraspinals ?Trigger Point Dry Needling Treatment: ?Pre-treatment instruction: Patient instructed on dry needling rationale, procedures, and possible side effects including pain during treatment (achy,cramping feeling), bruising, drop of blood, lightheadedness, nausea, sweating. ?Patient Consent Given: Yes ?Education handout provided: No ?Muscles treated: Bilat lumbar multifidi  ?Needle size and number: .30x64m x 4 ?Electrical stimulation performed: Yes ?Parameters: Milli 4 frequency intensity to patient tolerance x 10 minutes ?Treatment response/outcome: Twitch response elicited, Palpable decrease in muscle tension, and patient demonstrates improved motion with less pain ?Post-treatment instructions: Patient instructed to expect possible mild to moderate muscle soreness later today and/or tomorrow. Patient instructed in methods to reduce muscle soreness and to continue prescribed HEP. If patient was dry needled over the lung field, patient was instructed on signs and symptoms of pneumothorax and, however unlikely, to see immediate medical attention should they occur. Patient was also educated on signs and symptoms of infection and to seek medical attention should they occur. Patient verbalized understanding of these instructions and education. ?  ?PATIENT EDUCATION:  ?Education details: POC update to include aquatic therapy, HEP ?Person educated: Patient ?Education method: Explanation, Demonstration, Tactile cues, Verbal cues ?Education comprehension: verbalized understanding, returned demonstration, verbal cues required, tactile cues required, and needs further  education ?  ?HOME EXERCISE PROGRAM: ?Access Code: [email or web address]?  ?  ?ASSESSMENT: ?CLINICAL IMPRESSION: ?Patient tolerated therapy well with no adverse effects. She has made great progress in therapy, demonstrating improved strength and

## 2021-12-25 ENCOUNTER — Other Ambulatory Visit: Payer: Self-pay

## 2021-12-25 ENCOUNTER — Encounter: Payer: Self-pay | Admitting: Physical Therapy

## 2021-12-25 ENCOUNTER — Ambulatory Visit: Payer: BC Managed Care – PPO | Admitting: Physical Therapy

## 2021-12-25 DIAGNOSIS — R293 Abnormal posture: Secondary | ICD-10-CM

## 2021-12-25 DIAGNOSIS — M6281 Muscle weakness (generalized): Secondary | ICD-10-CM

## 2021-12-25 DIAGNOSIS — M79601 Pain in right arm: Secondary | ICD-10-CM | POA: Diagnosis not present

## 2021-12-25 DIAGNOSIS — M545 Low back pain, unspecified: Secondary | ICD-10-CM

## 2021-12-25 DIAGNOSIS — G8929 Other chronic pain: Secondary | ICD-10-CM | POA: Diagnosis not present

## 2021-12-25 DIAGNOSIS — M542 Cervicalgia: Secondary | ICD-10-CM | POA: Diagnosis not present

## 2021-12-25 NOTE — Patient Instructions (Signed)
Access Code: [email or web address] ?URL: [email or web address] ?Date: 12/25/2021 ?Prepared by: Hilda Blades ? ?Exercises ?- Supine Lower Trunk Rotation  - 1-2 x daily - 10 reps - 5 seconds hold ?- Supine Piriformis Stretch with Foot on Ground  - 1-2 x daily - 3 reps - 20 seconds hold ?- Child's Pose Stretch  - 1-2 x daily - 3 reps - 20 sec hold ?- Bridge  - 1 x daily - 2 sets - 10 reps - 3 sec hold ?- Straight Leg Raise  - 1 x daily - 2 sets - 10 reps ?- Hooklying Clamshell with Resistance  - 1 x daily - 2 sets - 10 reps - 3 sec hold ?- Sidelying Hip Abduction  - 1 x daily - 2 sets - 10 reps ?- Seated Cervical Sidebending Stretch  - 1-2 x daily - 3 reps - 20 seconds hold ?- Seated Levator Scapulae Stretch  - 1-2 x daily - 3 reps - 20 seconds hold ?- Standing Row with Anchored Resistance  - 1 x daily - 2 sets - 10 reps ?- Shoulder External Rotation and Scapular Retraction with Resistance  - 1 x daily - 2 sets - 10 reps ?- Standing Shoulder Horizontal Abduction with Resistance  - 1 x daily - 2 sets - 10 reps ?- Scapular Retraction with Resistance Advanced  - 1 x daily - 2 sets - 10 reps ?

## 2021-12-26 ENCOUNTER — Ambulatory Visit
Admission: RE | Admit: 2021-12-26 | Discharge: 2021-12-26 | Disposition: A | Discharge status: Home / Self Care | Payer: BC Managed Care – PPO | Source: Ambulatory Visit | Attending: Family Medicine | Admitting: Family Medicine

## 2021-12-26 DIAGNOSIS — Z1231 Encounter for screening mammogram for malignant neoplasm of breast: Secondary | ICD-10-CM | POA: Diagnosis not present

## 2022-01-03 ENCOUNTER — Encounter: Payer: Self-pay | Admitting: Gastroenterology

## 2022-01-22 NOTE — Therapy (Signed)
OUTPATIENT PHYSICAL THERAPY TREATMENT NOTE   Patient Name: Krista Campbell MRN: [id number] DOB:1970/09/29, 51 y.o., female Today's Date: 01/24/2022  PCP: Charlott Rakes, MD REFERRING PROVIDER: Charlott Rakes, MD   PT End of Session - 01/24/22 0834     Visit Number 8    Number of Visits 11    Date for PT Re-Evaluation 02/05/22    Authorization Type [employer]    PT Start Time 0830    PT Stop Time 0910    PT Time Calculation (min) 40 min    Activity Tolerance Patient tolerated treatment well    Behavior During Therapy WFL for tasks assessed/performed                  Past Medical History:  Diagnosis Date   Allergy    mild   Diabetes mellitus without complication ([place])    GERD (gastroesophageal reflux disease)    Hyperlipidemia    History reviewed. No pertinent surgical history. Patient Active Problem List   Diagnosis Date Noted   Obesity (BMI 35.0-39.9 without comorbidity) 03/24/2018   HLD (hyperlipidemia) 05/21/2014   Hypomagnesemia 05/21/2014   DKA (diabetic ketoacidoses) 05/19/2014   Hyponatremia 05/19/2014   Hypokalemia 05/19/2014   DM (diabetes mellitus) ([place]) 05/19/2014   Dehydration 05/19/2014    REFERRING PROVIDER: Charlott Rakes, MD   REFERRING DIAG: Cervical radiculopathy  THERAPY DIAG:  Chronic bilateral low back pain, unspecified whether sciatica present  Pain in right arm  Cervicalgia  Muscle weakness (generalized)  Abnormal posture  PERTINENT HISTORY: DM  PRECAUTIONS: None  SUBJECTIVE: Patient reports she continues to well. She has been out of town on vacation the past few weeks. She would like another pool therapy session to have some exercises to work on. She has been consistent with her exercises.  PAIN:  Are you having pain? No NPRS scale: 0/10 Pain location: Low back Pain orientation: Bilateral (left > right) PAIN TYPE: Chronic Pain description: Constant, tight Aggravating factors: Inactivity, lying down, sitting  extended periods Relieving factors: Medication  PATIENT GOALS: Pain relief so can do all things around home and work without hurting   OBJECTIVE: (measures assessed at evaluation unless otherwise noted) PATIENT SURVEYS:  FOTO 68% functional status (neck)  12/25/2021: 80%   POSTURE:  Rounded shoulder and forward head posture   PALPATION: Tender to palpation with trigger points noted right upper trap region, infraspinatus         CERVICAL AROM:   AROM AROM (deg) 10/18/2021  12/25/2021  Flexion 60   Extension 55   Right lateral flexion 40 45  Left lateral flexion 35 45  Right rotation 75 75  Left rotation 75* 75    LUMBAR AROM:   A/PROM AROM  10/18/2021  11/07/2021  12/25/2021  Flexion WFL WFL - reduced stiffness WFL  Extension WFL    Right lateral flexion WFL    Left lateral flexion WFL    Right rotation [facility]    Left rotation  [facility]      UE AROM:   AROM Right 10/18/2021 Left 10/18/2021 Right 10/27/2021 Right 12/25/2021  Shoulder flexion 110 175 170 170  Shoulder internal rotation T6 T6    Shoulder external rotation T2 T4     UE MMT:   MMT Right 10/18/2021 Left 10/18/2021 Rt / Lt 12/25/2021  Shoulder flexion 4 5 4+ / 5  Shoulder extension 4 5   Shoulder abduction 4 5 4+ / 5  Shoulder internal rotation 4+ 5   Shoulder external rotation 4  5   Periscapular musculature 4- 4- 4 / 4  Elbow flexion 5 5   Elbow extension 5 5   Grip strength 50 52   Patient reports right shoulder pain with all right sided muscle strength testing   Patient with upper trap compensation with periscapular muscle testing  L MMT:   MMT Right 11/27/2021 Left 11/27/2021 Rt / Lt 12/25/2021  Hip flexion '4 4 4 ' / 4  Hip extension 4- 4- 4- / 4-  Hip abduction 4- 4- 4- / 4-      TODAY'S TREATMENT:  [facility] Adult PT Treatment:                                                DATE: 01/24/2022 Therapeutic Exercise: NuStep L6 x 5 min with UE/LE while taking subjective Piriformis stretch 2 x 30 sec  each Figure-4 LTR 5 x 5 sec each Bridge 2 x 10 with 3 sec hold SLR 2 x 10 Sidelying hip abduction 2 x 15 Row with blue 2 x 20 Extension and scap retraction with blue 2 x 15 Pallof press with green 2 x 10 Double ER and scap retraction with blue 2 x 10 Supine horizontal shoulder abduction with blue 2 x 10   [facility] Adult PT Treatment:                                                DATE: 12/25/2021 Therapeutic Exercise: NuStep L6 x 5 min with UE/LE while taking subjective Piriformis stretch 2 x 20 sec each Figure-4 LTR 3 x 5 sec each Bridge 2 x 10 with 3 sec hold SLR 2 x 10 Sidelying hip abduction 2 x 10 Row with blue 2 x 20 Extension and scap retraction with blue 2 x 10 Double ER and scap retraction 2 x 10 Horizontal abduction with blue 2 x 10   PATIENT EDUCATION:  Education details: HEP, attending one more aquatic therapy session to learn pool HEP Person educated: Patient Education method: Explanation, Demonstration, Tactile cues, Verbal cues Education comprehension: verbalized understanding, returned demonstration, verbal cues required, tactile cues required, and needs further education   HOME EXERCISE PROGRAM: Access Code: [email or web address]     ASSESSMENT: CLINICAL IMPRESSION: Patient tolerated therapy well with no adverse effects. She continues to report minimal symptoms only when standing or sitting for extended periods. Therapy continues to focus on progression of postural, core, and hip strength to improve tolerance and control with activity. She does require occasional cueing for exercises technique and control, and she did report right shoulder fatigue following therapy. She requests one more aquatic visit as this was beneficial on the past and she would like to learn a pool HEP so she can use independently. No changes to HEP this visit. Patient would benefit from continued skilled PT to progress her mobility and strength in order to reduce pain and maximize functional ability.      OBJECTIVE IMPAIRMENTS decreased activity tolerance, decreased ROM, decreased strength, postural dysfunction, and pain.    ACTIVITY LIMITATIONS cleaning, community activity, occupation, and shopping.    PERSONAL FACTORS Fitness, Past/current experiences, and Time since onset of injury/illness/exacerbation are also affecting patient's functional outcome.      GOALS: Goals reviewed with patient?  Yes   SHORT TERM GOALS:   Patient will be I with initial HEP in order to progress with therapy. Baseline: provided at eval 11/27/2021: progressing 12/25/2021: independent with initial HEP Target date: 11/15/2021 Goal status: MET   2.  PT will review FOTO with patient by 3rd visit in order to understand expected progress and outcome with therapy. Baseline: assessed at eval 11/27/2021: reviewed Target date: 11/15/2021 Goal status: MET   3.  Patient will report pain level </= 3/10 in order to reduce functional limitations. Baseline: 6/10 11/27/2021: 5/10 12/25/2021: patient denies pain currently Target date: 11/15/2021 Goal status: MET   LONG TERM GOALS:   Patient will be I with final HEP to maintain progress from PT. Baseline: provided at eval 12/25/2021: progressing toward final HEP Target date: 02/05/2022 Goal status: ONGOING   2.  Patient will report >/= 70% status on FOTO to indicate improved functional ability. Baseline: 68% functional status  12/25/2021: 80% Target date: 12/13/2021 Goal status: MET   3.  Patient will demonstrate right shoulder elevation >/= 160 deg in order to improve ability to use right arm for household tasks and reaching into overhead cabinets. Baseline: 110 deg 12/25/2021: 170 deg Target date: 12/13/2021 Goal status: MET   4.  Patient will report no increased pain or stiffness with lumbar AROM in order to reduce functional limitations with household tasks Baseline: patient reports stiffness/pain with lumbar flexion, side bend, and rotation 12/25/2021: no pain or  limitation with AROM Target date: 12/13/2021 Goal status: MET   5.  Patient will report no limitation with sitting tolerance due to shoulder pain in order to improve ability to perform work related tasks Baseline: patient reports limitations with sitting due to right shoulder pain 12/25/2021: patient reports continued difficulty with sitting extended periods of time Target date: 02/05/2022 Goal status: ONGOING     PLAN: PT FREQUENCY: 1x/week   PT DURATION: 6 weeks   PLANNED INTERVENTIONS: Therapeutic exercises, Therapeutic activity, Neuromuscular re-education, Balance training, Gait training, Patient/Family education, Joint manipulation, Joint mobilization, Aquatic Therapy, Dry Needling, Electrical stimulation, Spinal manipulation, Spinal mobilization, Cryotherapy, Moist heat, Taping, and Manual therapy   PLAN FOR NEXT SESSION: Review HEP and progress PRN, manual/dry needling for right upper trap/infra and lumbar region as needed, core and postural strengthening, rotator cuff strengthening    Hilda Blades, PT, DPT, LAT, ATC 01/24/22  9:22 AM Phone: [phone] Fax: [phone]

## 2022-01-23 ENCOUNTER — Ambulatory Visit (AMBULATORY_SURGERY_CENTER): Payer: Self-pay | Admitting: *Deleted

## 2022-01-23 VITALS — Ht 61.0 in | Wt 192.0 lb

## 2022-01-23 DIAGNOSIS — Z1211 Encounter for screening for malignant neoplasm of colon: Secondary | ICD-10-CM

## 2022-01-23 MED ORDER — NA SULFATE-K SULFATE-MG SULF 17.5-3.13-1.6 GM/177ML PO SOLN: 1.0000 | Freq: Once | ORAL | 0 refills | Status: AC

## 2022-01-23 NOTE — Progress Notes (Signed)
No egg or soy allergy known to patient  No issues known to pt with past sedation with any surgeries or procedures Patient denies ever being told they had issues or difficulty with intubation  No FH of Malignant Hyperthermia Pt is not on diet pills Pt is not on  home 02  Pt is not on blood thinners  Pt denies issues with constipation  No A fib or A flutter  NO PA's for preps discussed with pt In [place] today  Discussed with pt there will be an out-of-pocket cost for prep and that varies from $0 to 70 +  dollars - pt verbalized understanding  Pt instructed to use [employer] or GoodRx for a price reduction on prep  goodrx coupon for  suprep for [facility] to pt in [place] today

## 2022-01-24 ENCOUNTER — Other Ambulatory Visit: Payer: Self-pay

## 2022-01-24 ENCOUNTER — Ambulatory Visit: Payer: BC Managed Care – PPO | Attending: Family Medicine | Admitting: Physical Therapy

## 2022-01-24 ENCOUNTER — Encounter: Payer: Self-pay | Admitting: Physical Therapy

## 2022-01-24 DIAGNOSIS — M542 Cervicalgia: Secondary | ICD-10-CM | POA: Diagnosis not present

## 2022-01-24 DIAGNOSIS — G8929 Other chronic pain: Secondary | ICD-10-CM | POA: Diagnosis not present

## 2022-01-24 DIAGNOSIS — M79601 Pain in right arm: Secondary | ICD-10-CM | POA: Diagnosis not present

## 2022-01-24 DIAGNOSIS — R293 Abnormal posture: Secondary | ICD-10-CM | POA: Insufficient documentation

## 2022-01-24 DIAGNOSIS — M6281 Muscle weakness (generalized): Secondary | ICD-10-CM | POA: Diagnosis not present

## 2022-01-24 DIAGNOSIS — M545 Low back pain, unspecified: Secondary | ICD-10-CM | POA: Insufficient documentation

## 2022-01-29 ENCOUNTER — Ambulatory Visit: Payer: BC Managed Care – PPO | Attending: Family Medicine

## 2022-01-29 DIAGNOSIS — Z23 Encounter for immunization: Secondary | ICD-10-CM

## 2022-01-29 NOTE — Progress Notes (Signed)
Pt arrived to clinic for 2nd shingles vaccine Pt given vaccine was given in left deltoid muscle Pt tolerated injection well

## 2022-01-31 ENCOUNTER — Ambulatory Visit (HOSPITAL_BASED_OUTPATIENT_CLINIC_OR_DEPARTMENT_OTHER): Payer: BC Managed Care – PPO | Attending: Family Medicine | Admitting: Physical Therapy

## 2022-01-31 ENCOUNTER — Encounter (HOSPITAL_BASED_OUTPATIENT_CLINIC_OR_DEPARTMENT_OTHER): Payer: Self-pay | Admitting: Physical Therapy

## 2022-01-31 DIAGNOSIS — M6281 Muscle weakness (generalized): Secondary | ICD-10-CM | POA: Diagnosis not present

## 2022-01-31 DIAGNOSIS — M545 Low back pain, unspecified: Secondary | ICD-10-CM | POA: Insufficient documentation

## 2022-01-31 DIAGNOSIS — M79601 Pain in right arm: Secondary | ICD-10-CM | POA: Insufficient documentation

## 2022-01-31 DIAGNOSIS — G8929 Other chronic pain: Secondary | ICD-10-CM | POA: Diagnosis not present

## 2022-01-31 NOTE — Therapy (Signed)
OUTPATIENT PHYSICAL THERAPY TREATMENT NOTE   Patient Name: Krista Campbell MRN: [id number] DOB:1971-04-08, 51 y.o., female Today's Date: 01/31/2022  PCP: Charlott Rakes, MD REFERRING PROVIDER: Charlott Rakes, MD          Past Medical History:  Diagnosis Date   Allergy    mild   Diabetes mellitus without complication ([place])    GERD (gastroesophageal reflux disease)    Hyperlipidemia    No past surgical history on file. Patient Active Problem List   Diagnosis Date Noted   Obesity (BMI 35.0-39.9 without comorbidity) 03/24/2018   HLD (hyperlipidemia) 05/21/2014   Hypomagnesemia 05/21/2014   DKA (diabetic ketoacidoses) 05/19/2014   Hyponatremia 05/19/2014   Hypokalemia 05/19/2014   DM (diabetes mellitus) ([place]) 05/19/2014   Dehydration 05/19/2014    REFERRING PROVIDER: Charlott Rakes, MD   REFERRING DIAG: Cervical radiculopathy  THERAPY DIAG:  No diagnosis found.  PERTINENT HISTORY: DM  PRECAUTIONS: None  SUBJECTIVE: Patient reports she continues to well. She has been out of town on vacation the past few weeks. She would like another pool therapy session to have some exercises to work on. She has been consistent with her exercises.  PAIN:  Are you having pain? No NPRS scale: 0/10 Pain location: Low back Pain orientation: Bilateral (left > right) PAIN TYPE: Chronic Pain description: Constant, tight Aggravating factors: Inactivity, lying down, sitting extended periods Relieving factors: Medication  PATIENT GOALS: Pain relief so can do all things around home and work without hurting   OBJECTIVE: (measures assessed at evaluation unless otherwise noted) PATIENT SURVEYS:  FOTO 68% functional status (neck)  12/25/2021: 80%   POSTURE:  Rounded shoulder and forward head posture   PALPATION: Tender to palpation with trigger points noted right upper trap region, infraspinatus         CERVICAL AROM:   AROM AROM (deg) 10/18/2021  12/25/2021  Flexion 60    Extension 55   Right lateral flexion 40 45  Left lateral flexion 35 45  Right rotation 75 75  Left rotation 75* 75    LUMBAR AROM:   A/PROM AROM  10/18/2021  11/07/2021  12/25/2021  Flexion WFL WFL - reduced stiffness WFL  Extension WFL    Right lateral flexion WFL    Left lateral flexion WFL    Right rotation [facility]    Left rotation  [facility]      UE AROM:   AROM Right 10/18/2021 Left 10/18/2021 Right 10/27/2021 Right 12/25/2021  Shoulder flexion 110 175 170 170  Shoulder internal rotation T6 T6    Shoulder external rotation T2 T4     UE MMT:   MMT Right 10/18/2021 Left 10/18/2021 Rt / Lt 12/25/2021  Shoulder flexion 4 5 4+ / 5  Shoulder extension 4 5   Shoulder abduction 4 5 4+ / 5  Shoulder internal rotation 4+ 5   Shoulder external rotation 4 5   Periscapular musculature 4- 4- 4 / 4  Elbow flexion 5 5   Elbow extension 5 5   Grip strength 50 52   Patient reports right shoulder pain with all right sided muscle strength testing   Patient with upper trap compensation with periscapular muscle testing  L MMT:   MMT Right 11/27/2021 Left 11/27/2021 Rt / Lt 12/25/2021  Hip flexion _0 / 4  Hip extension 4- 4- 4- / 4-  Hip abduction 4- 4- 4- / 4-      TODAY'S TREATMENT:   [facility] Adult PT Treatment:  DATE: 01/31/2022 Aquatic  Pt seen for aquatic therapy today.  Treatment took place in water 3.25-4.8 ft in depth at the [facility] pool. Temp of water was 91.  Pt entered/exited the pool via stairs step through pattern independently with bilat rail.  Walking multiple widths forward, back and side stepping  Stretching - Lumbar flex holding to hand rails 3 x 30 s  - hamstring and gastroc stretch above position 3x30s ea  Strengthening: abdominal bracing, glute isometrics - push pull kick board 3x15.-kick board push downs 2x25.    Cues for techniques to increase/decrease resistance to comfort. -2 foam hand buoys submerged  walking 4 widths forward and back.  Cues for depressed shoulder position -Side lunges x 4 widths ue add/abd 2 foam hand buoys -Hip circles in hip flex then abd CW and CCW x20ea holding to wall  Pt requires buoyancy for support and to offload joints with strengthening exercises. Viscosity of the water is needed for resistance of strengthening; water current perturbations provides challenge to standing balance unsupported, requiring increased core activation. [facility] Adult PT Treatment:                                                DATE: 01/24/2022 Therapeutic Exercise: NuStep L6 x 5 min with UE/LE while taking subjective Piriformis stretch 2 x 30 sec each Figure-4 LTR 5 x 5 sec each Bridge 2 x 10 with 3 sec hold SLR 2 x 10 Sidelying hip abduction 2 x 15 Row with blue 2 x 20 Extension and scap retraction with blue 2 x 15 Pallof press with green 2 x 10 Double ER and scap retraction with blue 2 x 10 Supine horizontal shoulder abduction with blue 2 x 10   [facility] Adult PT Treatment:                                                DATE: 12/25/2021 Therapeutic Exercise: NuStep L6 x 5 min with UE/LE while taking subjective Piriformis stretch 2 x 20 sec each Figure-4 LTR 3 x 5 sec each Bridge 2 x 10 with 3 sec hold SLR 2 x 10 Sidelying hip abduction 2 x 10 Row with blue 2 x 20 Extension and scap retraction with blue 2 x 10 Double ER and scap retraction 2 x 10 Horizontal abduction with blue 2 x 10   PATIENT EDUCATION:  Education details: HEP, attending one more aquatic therapy session to learn pool HEP Person educated: Patient Education method: Explanation, Demonstration, Tactile cues, Verbal cues Education comprehension: verbalized understanding, returned demonstration, verbal cues required, tactile cues required, and needs further education   HOME EXERCISE PROGRAM: Access Code: [email or web address]     ASSESSMENT: CLINICAL IMPRESSION: Reduction in pain completely with aquatic session today.  Pt  instructed on HEP as it was being assigned to her as well as written for her to follow.  She completes all without difficulty, actually with ease. Focused completely on core strength and stretching.       OBJECTIVE IMPAIRMENTS decreased activity tolerance, decreased ROM, decreased strength, postural dysfunction, and pain.    ACTIVITY LIMITATIONS cleaning, community activity, occupation, and shopping.    PERSONAL FACTORS Fitness, Past/current experiences, and Time since onset of injury/illness/exacerbation are also  affecting patient's functional outcome.      GOALS: Goals reviewed with patient? Yes   SHORT TERM GOALS:   Patient will be I with initial HEP in order to progress with therapy. Baseline: provided at eval 11/27/2021: progressing 12/25/2021: independent with initial HEP Target date: 11/15/2021 Goal status: MET   2.  PT will review FOTO with patient by 3rd visit in order to understand expected progress and outcome with therapy. Baseline: assessed at eval 11/27/2021: reviewed Target date: 11/15/2021 Goal status: MET   3.  Patient will report pain level </= 3/10 in order to reduce functional limitations. Baseline: 6/10 11/27/2021: 5/10 12/25/2021: patient denies pain currently Target date: 11/15/2021 Goal status: MET   LONG TERM GOALS:   Patient will be I with final HEP to maintain progress from PT. Baseline: provided at eval 12/25/2021: progressing toward final HEP Target date: 02/05/2022 Goal status: ONGOING   2.  Patient will report >/= 70% status on FOTO to indicate improved functional ability. Baseline: 68% functional status  12/25/2021: 80% Target date: 12/13/2021 Goal status: MET   3.  Patient will demonstrate right shoulder elevation >/= 160 deg in order to improve ability to use right arm for household tasks and reaching into overhead cabinets. Baseline: 110 deg 12/25/2021: 170 deg Target date: 12/13/2021 Goal status: MET   4.  Patient will report no increased pain  or stiffness with lumbar AROM in order to reduce functional limitations with household tasks Baseline: patient reports stiffness/pain with lumbar flexion, side bend, and rotation 12/25/2021: no pain or limitation with AROM Target date: 12/13/2021 Goal status: MET   5.  Patient will report no limitation with sitting tolerance due to shoulder pain in order to improve ability to perform work related tasks Baseline: patient reports limitations with sitting due to right shoulder pain 12/25/2021: patient reports continued difficulty with sitting extended periods of time Target date: 02/05/2022 Goal status: ONGOING     PLAN: PT FREQUENCY: 1x/week   PT DURATION: 6 weeks   PLANNED INTERVENTIONS: Therapeutic exercises, Therapeutic activity, Neuromuscular re-education, Balance training, Gait training, Patient/Family education, Joint manipulation, Joint mobilization, Aquatic Therapy, Dry Needling, Electrical stimulation, Spinal manipulation, Spinal mobilization, Cryotherapy, Moist heat, Taping, and Manual therapy   PLAN FOR NEXT SESSION: Review HEP and progress PRN, manual/dry needling for right upper trap/infra and lumbar region as needed, core and postural strengthening, rotator cuff strengthening    Stanton Kidney Tharon Aquas) Millie Shorb MPT 01/31/22  10:33 AM Phone: [phone] Fax: [phone]

## 2022-02-07 ENCOUNTER — Other Ambulatory Visit: Payer: Self-pay

## 2022-02-07 ENCOUNTER — Encounter: Payer: Self-pay | Admitting: Physical Therapy

## 2022-02-07 ENCOUNTER — Ambulatory Visit: Payer: BC Managed Care – PPO | Admitting: Physical Therapy

## 2022-02-07 DIAGNOSIS — M542 Cervicalgia: Secondary | ICD-10-CM | POA: Diagnosis not present

## 2022-02-07 DIAGNOSIS — M6281 Muscle weakness (generalized): Secondary | ICD-10-CM

## 2022-02-07 DIAGNOSIS — M79601 Pain in right arm: Secondary | ICD-10-CM

## 2022-02-07 DIAGNOSIS — R293 Abnormal posture: Secondary | ICD-10-CM | POA: Diagnosis not present

## 2022-02-07 DIAGNOSIS — M545 Low back pain, unspecified: Secondary | ICD-10-CM | POA: Diagnosis not present

## 2022-02-07 DIAGNOSIS — G8929 Other chronic pain: Secondary | ICD-10-CM | POA: Diagnosis not present

## 2022-02-07 NOTE — Therapy (Signed)
OUTPATIENT PHYSICAL THERAPY TREATMENT NOTE  DISCHARGE   Patient Name: Krista Campbell MRN: [id number] DOB:[birth year], 51 y.o., female Today's Date: 02/07/2022  PCP: Charlott Rakes, MD REFERRING PROVIDER: Charlott Rakes, MD   PT End of Session - 02/07/22 0921     Visit Number 10    Number of Visits 11    Date for PT Re-Evaluation 02/07/22    Authorization Type [employer]    PT Start Time 0915    PT Stop Time 1000    PT Time Calculation (min) 45 min    Activity Tolerance Patient tolerated treatment well    Behavior During Therapy WFL for tasks assessed/performed                   Past Medical History:  Diagnosis Date   Allergy    mild   Diabetes mellitus without complication ([place])    GERD (gastroesophageal reflux disease)    Hyperlipidemia    History reviewed. No pertinent surgical history. Patient Active Problem List   Diagnosis Date Noted   Obesity (BMI 35.0-39.9 without comorbidity) 03/24/2018   HLD (hyperlipidemia) 05/21/2014   Hypomagnesemia 05/21/2014   DKA (diabetic ketoacidoses) 05/19/2014   Hyponatremia 05/19/2014   Hypokalemia 05/19/2014   DM (diabetes mellitus) ([place]) 05/19/2014   Dehydration 05/19/2014    REFERRING PROVIDER: Charlott Rakes, MD   REFERRING DIAG: Cervical radiculopathy  THERAPY DIAG:  Pain in right arm  Chronic bilateral low back pain, unspecified whether sciatica present  Cervicalgia  Muscle weakness (generalized)  Abnormal posture  PERTINENT HISTORY: DM  PRECAUTIONS: None   SUBJECTIVE: Patient reports her back and neck feel fine but she is having some 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  PAIN:  Are you having pain? No NPRS scale: 0/10 Pain location: Low back / Neck Pain orientation: Bilateral (left > right) PAIN TYPE: Chronic Pain description: Constant, tight Aggravating factors: Inactivity, lying down, sitting extended periods Relieving factors:  Medication  PATIENT GOALS: Pain relief so can do all things around home and work without hurting   OBJECTIVE: (measures assessed at evaluation unless otherwise noted) PATIENT SURVEYS:  FOTO 68% functional status (neck)  12/25/2021: 80%   POSTURE:  Rounded shoulder and forward head posture   PALPATION: Tender to palpation with trigger points noted right upper trap region, infraspinatus         CERVICAL AROM:   AROM AROM (deg) 10/18/2021  12/25/2021  Flexion 60   Extension 55   Right lateral flexion 40 45  Left lateral flexion 35 45  Right rotation 75 75  Left rotation 75* 75    LUMBAR AROM:   A/PROM AROM  10/18/2021  11/07/2021  12/25/2021  Flexion WFL WFL - reduced stiffness WFL  Extension WFL    Right lateral flexion WFL    Left lateral flexion WFL    Right rotation [facility]    Left rotation  [facility]      UE AROM:   AROM Right 10/18/2021 Left 10/18/2021 Right 10/27/2021 Right 12/25/2021  Shoulder flexion 110 175 170 170  Shoulder internal rotation T6 T6    Shoulder external rotation T2 T4     UE MMT:   MMT Right 10/18/2021 Left 10/18/2021 Rt / Lt 12/25/2021  Shoulder flexion 4 5 4+ / 5  Shoulder extension 4 5   Shoulder abduction 4 5 4+ / 5  Shoulder internal rotation 4+ 5   Shoulder external rotation 4 5   Periscapular musculature  4- 4- 4 / 4  Elbow flexion 5 5   Elbow extension 5 5   Grip strength 50 52   Patient reports right shoulder pain with all right sided muscle strength testing   Patient with upper trap compensation with periscapular muscle testing  L MMT:   MMT Right 11/27/2021 Left 11/27/2021 Rt / Lt 12/25/2021  Hip flexion '4 4 4 ' / 4  Hip extension 4- 4- 4- / 4-  Hip abduction 4- 4- 4- / 4-      TODAY'S TREATMENT:  [facility] Adult PT Treatment:                                                DATE: 02/07/2022 Therapeutic Exercise: NuStep L6 x 5 min with UE/LE while taking subjective Upper trap stretch 2 x 20 sec each Row with blue 2 x 20 ER with green 2 x  20 Review of current HEP to ensure independence for home Manual: Skilled palpation and monitoring of muscle tension while performing TPDN treatment STM right upper trap and posterior cuff Trigger Point Dry Needling Treatment: Pre-treatment instruction: Patient instructed on dry needling rationale, procedures, and possible side effects including pain during treatment (achy,cramping feeling), bruising, drop of blood, lightheadedness, nausea, sweating. Patient Consent Given: Yes Education handout provided: Previously provided Muscles treated: right upper trap, infraspinatus, teres minor   Needle size and number: .30x82m x 6 Electrical stimulation performed: No Parameters: N/A Treatment response/outcome: Twitch response elicited and Palpable decrease in muscle tension, and patient report reduction in symptoms Post-treatment instructions: Patient instructed to expect possible mild to moderate muscle soreness later today and/or tomorrow. Patient instructed in methods to reduce muscle soreness and to continue prescribed HEP. If patient was dry needled over the lung field, patient was instructed on signs and symptoms of pneumothorax and, however unlikely, to see immediate medical attention should they occur. Patient was also educated on signs and symptoms of infection and to seek medical attention should they occur. Patient verbalized understanding of these instructions and education.   [facility]                                                DATE: 01/24/2022 Therapeutic Exercise: NuStep L6 x 5 min with UE/LE while taking subjective Piriformis stretch 2 x 30 sec each Figure-4 LTR 5 x 5 sec each Bridge 2 x 10 with 3 sec hold SLR 2 x 10 Sidelying hip abduction 2 x 15 Row with blue 2 x 20 Extension and scap retraction with blue 2 x 15 Pallof press with green 2 x 10 Double ER and scap retraction with blue 2 x 10 Supine horizontal shoulder abduction with blue 2 x 10  [facility] Adult PT  Treatment:                                                DATE: 12/25/2021 Therapeutic Exercise: NuStep L6 x 5 min with UE/LE while taking subjective Piriformis stretch 2 x 20 sec each Figure-4 LTR 3 x 5 sec each Bridge 2 x 10 with 3 sec hold  SLR 2 x 10 Sidelying hip abduction 2 x 10 Row with blue 2 x 20 Extension and scap retraction with blue 2 x 10 Double ER and scap retraction 2 x 10 Horizontal abduction with blue 2 x 10   PATIENT EDUCATION:  Education details: POC discharge, HEP for aquatic, TPDN Person educated: Patient Education method: Consulting civil engineer, Demonstration Education comprehension: verbalized understanding, returned demonstration   HOME EXERCISE PROGRAM: Access Code: [email or web address]     ASSESSMENT: CLINICAL IMPRESSION: Patient tolerated therapy well with no adverse effects. Overall she seems to be doing much better regarding her neck and back pain, she does have some right shoulder pain due to increased lifting but this was improved with dry needling this visit which patient requested despite this being her final visit. She has achieved all her goals except does continue to have occasional pain with sitting extended periods at work, but this has improved since start of therapy. Patient will be formally discharged from PT as she is independent with her HEP and will join aquatic center for pool based exercise program to progress her strength and mobility to continue improve symptoms.     OBJECTIVE IMPAIRMENTS decreased activity tolerance, decreased ROM, decreased strength, postural dysfunction, and pain.    ACTIVITY LIMITATIONS cleaning, community activity, occupation, and shopping.    PERSONAL FACTORS Fitness, Past/current experiences, and Time since onset of injury/illness/exacerbation are also affecting patient's functional outcome.      GOALS: Goals reviewed with patient? Yes   SHORT TERM GOALS:   Patient will be I with initial HEP in order to progress with  therapy. Baseline: provided at eval 11/27/2021: progressing 12/25/2021: independent with initial HEP Target date: 11/15/2021 Goal status: MET   2.  PT will review FOTO with patient by 3rd visit in order to understand expected progress and outcome with therapy. Baseline: assessed at eval 11/27/2021: reviewed Target date: 11/15/2021 Goal status: MET   3.  Patient will report pain level </= 3/10 in order to reduce functional limitations. Baseline: 6/10 11/27/2021: 5/10 12/25/2021: patient denies pain currently Target date: 11/15/2021 Goal status: MET   LONG TERM GOALS:   Patient will be I with final HEP to maintain progress from PT. Baseline: provided at eval 12/25/2021: progressing toward final HEP 02/07/2022: independent Target date: 02/05/2022 Goal status: MET   2.  Patient will report >/= 70% status on FOTO to indicate improved functional ability. Baseline: 68% functional status  12/25/2021: 80% Target date: 12/13/2021 Goal status: MET   3.  Patient will demonstrate right shoulder elevation >/= 160 deg in order to improve ability to use right arm for household tasks and reaching into overhead cabinets. Baseline: 110 deg 12/25/2021: 170 deg Target date: 12/13/2021 Goal status: MET   4.  Patient will report no increased pain or stiffness with lumbar AROM in order to reduce functional limitations with household tasks Baseline: patient reports stiffness/pain with lumbar flexion, side bend, and rotation 12/25/2021: no pain or limitation with AROM Target date: 12/13/2021 Goal status: MET   5.  Patient will report no limitation with sitting tolerance due to shoulder pain in order to improve ability to perform work related tasks Baseline: patient reports limitations with sitting due to right shoulder pain 12/25/2021: patient reports continued difficulty with sitting extended periods of time 02/07/2022: patient reports some right shoulder pain due to lifting more while moving home Target date:  02/05/2022 Goal status: PARTIALLY MET     PLAN: PT FREQUENCY: 1x/week   PT DURATION: 6 weeks   PLANNED  INTERVENTIONS: Therapeutic exercises, Therapeutic activity, Neuromuscular re-education, Balance training, Gait training, Patient/Family education, Joint manipulation, Joint mobilization, Aquatic Therapy, Dry Needling, Electrical stimulation, Spinal manipulation, Spinal mobilization, Cryotherapy, Moist heat, Taping, and Manual therapy   PLAN FOR NEXT SESSION: NA - discharge    Hilda Blades, PT, DPT, LAT, ATC 02/07/22  1:01 PM Phone: [phone] Fax: [phone]   PHYSICAL THERAPY DISCHARGE SUMMARY  Visits from Start of Care: 10  Current functional level related to goals / functional outcomes: See above   Remaining deficits: See above   Education / Equipment: HEP   Patient agrees to discharge. Patient goals were partially met. Patient is being discharged due to being pleased with the current functional level.

## 2022-02-08 ENCOUNTER — Encounter: Payer: Self-pay | Admitting: Gastroenterology

## 2022-02-16 ENCOUNTER — Encounter: Payer: Self-pay | Admitting: Gastroenterology

## 2022-02-16 ENCOUNTER — Ambulatory Visit (AMBULATORY_SURGERY_CENTER): Payer: BC Managed Care – PPO | Admitting: Gastroenterology

## 2022-02-16 VITALS — BP 108/66 | HR 82 | Temp 97.3°F | Resp 16 | Ht 61.0 in | Wt 192.0 lb

## 2022-02-16 DIAGNOSIS — E1149 Type 2 diabetes mellitus with other diabetic neurological complication: Secondary | ICD-10-CM

## 2022-02-16 DIAGNOSIS — D125 Benign neoplasm of sigmoid colon: Secondary | ICD-10-CM

## 2022-02-16 DIAGNOSIS — Z1211 Encounter for screening for malignant neoplasm of colon: Secondary | ICD-10-CM

## 2022-02-16 MED ORDER — DEXTROSE 5 % IV SOLN: INTRAVENOUS | Status: DC

## 2022-02-16 MED ORDER — SODIUM CHLORIDE 0.9 % IV SOLN: 500.0000 mL | INTRAVENOUS | Status: DC

## 2022-02-16 NOTE — Progress Notes (Signed)
[facility] History and Physical   Primary Care Physician:  Charlott Rakes, MD   Reason for Procedure:   Colon cancer screening  Plan:    colonoscopy     HPI: Krista Campbell is a 51 y.o. female  here for colonoscopy screening - first time exam. Patient denies any bowel symptoms at this time. No family history of colon cancer known. Otherwise feels well without any cardiopulmonary symptoms. I have discussed risks / benefits and she wants to proceed.   Past Medical History:  Diagnosis Date   Allergy    mild   Diabetes mellitus without complication ([facility])    GERD (gastroesophageal reflux disease)    Hyperlipidemia     History reviewed. No pertinent surgical history.  Prior to Admission medications   Medication Sig Start Date End Date Taking? Authorizing Provider  SEMGLEE, YFGN, 100 UNIT/ML Pen INJECT 36 UNITS SUBCUTANEOUS DAILY Patient taking differently: 38 units daily at night 11/08/21  Yes Newlin, Enobong, MD  atorvastatin (LIPITOR) 20 MG tablet TAKE 1 TABLET BY MOUTH DAILY AT 6 PM. 10/11/21   Charlott Rakes, MD  Bayer Microlet Lancets lancets Use to check blood sugar 3 times daily as directed. E11.9. Z79.4 07/31/19   Charlott Rakes, MD  Blood Glucose Monitoring Suppl (CONTOUR BLOOD GLUCOSE SYSTEM) DEVI Use to check blood sugar 3 times daily as directed. E11.9. Z79.4 07/31/19   Charlott Rakes, MD  cyclobenzaprine (FLEXERIL) 5 MG tablet cyclobenzaprine 5 mg tablet  TAKE 1 TABLET BY MOUTH THREE TIMES DAILY AS NEEDED FOR MUSCLE SPASM Patient not taking: Reported on 01/23/2022    [provider]  glucose blood test strip Use to check blood sugar 3 times daily as directed. E11.9. Z79.4 07/31/19   Charlott Rakes, MD  Insulin Pen Needle (TRUEPLUS PEN NEEDLES) 32G X 4 MM MISC USE AS DIRECTED 12/15/20   Charlott Rakes, MD  Insulin Syringe-Needle U-100 (BD INSULIN SYRINGE ULTRAFINE) 31G X 15/64" 0.5 ML MISC USE AS DIRECTED 10/04/16   Charlott Rakes, MD  Magnesium  400 MG TABS Take 400 mg by mouth 2 (two) times daily. Patient not taking: Reported on 01/23/2022 09/25/18   Charlott Rakes, MD  naproxen (NAPROSYN) 500 MG tablet Take 1 tablet (500 mg total) by mouth 2 (two) times daily with a meal. Patient not taking: Reported on 01/23/2022 10/11/21   Charlott Rakes, MD  Semaglutide,0.25 or 0.'5MG'$ /DOS, (OZEMPIC, 0.25 OR 0.5 MG/DOSE,) 2 MG/1.5ML SOPN INJECT 0.5 MG UNDER THE SKIN ONCE A WEEK Patient not taking: Reported on 01/23/2022 10/11/21   Charlott Rakes, MD    Current Outpatient Medications  Medication Sig Dispense Refill   SEMGLEE, YFGN, 100 UNIT/ML Pen INJECT 36 UNITS SUBCUTANEOUS DAILY (Patient taking differently: 38 units daily at night) 15 mL 1   atorvastatin (LIPITOR) 20 MG tablet TAKE 1 TABLET BY MOUTH DAILY AT 6 PM. 30 tablet 6   Bayer Microlet Lancets lancets Use to check blood sugar 3 times daily as directed. E11.9. Z79.4 100 each 11   Blood Glucose Monitoring Suppl (CONTOUR BLOOD GLUCOSE SYSTEM) DEVI Use to check blood sugar 3 times daily as directed. E11.9. Z79.4 1 each 0   cyclobenzaprine (FLEXERIL) 5 MG tablet cyclobenzaprine 5 mg tablet  TAKE 1 TABLET BY MOUTH THREE TIMES DAILY AS NEEDED FOR MUSCLE SPASM (Patient not taking: Reported on 01/23/2022)     glucose blood test strip Use to check blood sugar 3 times daily as directed. E11.9. Z79.4 100 each 12   Insulin Pen Needle (TRUEPLUS PEN NEEDLES) 32G  X 4 MM MISC USE AS DIRECTED 100 each 11   Insulin Syringe-Needle U-100 (BD INSULIN SYRINGE ULTRAFINE) 31G X 15/64" 0.5 ML MISC USE AS DIRECTED 100 each 12   Magnesium 400 MG TABS Take 400 mg by mouth 2 (two) times daily. (Patient not taking: Reported on 01/23/2022) 60 tablet 2   naproxen (NAPROSYN) 500 MG tablet Take 1 tablet (500 mg total) by mouth 2 (two) times daily with a meal. (Patient not taking: Reported on 01/23/2022) 30 tablet 3   Semaglutide,0.25 or 0.'5MG'$ /DOS, (OZEMPIC, 0.25 OR 0.5 MG/DOSE,) 2 MG/1.5ML SOPN INJECT 0.5 MG UNDER THE SKIN ONCE A  WEEK (Patient not taking: Reported on 01/23/2022) 1.5 mL 6   Current Facility-Administered Medications  Medication Dose Route Frequency Provider Last Rate Last Admin   0.9 %  sodium chloride infusion  500 mL Intravenous Continuous Kriston Pasquarello, Carlota Raspberry, MD       dextrose 5 % solution   Intravenous Continuous Alfreida Steffenhagen, Carlota Raspberry, MD        Allergies as of 02/16/2022 - Review Complete 02/16/2022  Allergen Reaction Noted   Metformin and related Diarrhea 01/04/2021    Family History  Problem Relation Age of Onset   Kidney disease Father    Breast cancer Paternal Aunt    Colon cancer Neg Hx    Colon polyps Neg Hx    Esophageal cancer Neg Hx    Rectal cancer Neg Hx    Stomach cancer Neg Hx     Social History   Socioeconomic History   Marital status: Significant Other    Spouse name: Not on file   Number of children: Not on file   Years of education: Not on file   Highest education level: Not on file  Occupational History   Not on file  Tobacco Use   Smoking status: Never   Smokeless tobacco: Never  Vaping Use   Vaping Use: Never used  Substance and Sexual Activity   Alcohol use: No   Drug use: No   Sexual activity: Not on file  Other Topics Concern   Not on file  Social History Narrative   Not on file   Social Determinants of Health   Financial Resource Strain: Not on file  Food Insecurity: Not on file  Transportation Needs: Not on file  Physical Activity: Not on file  Stress: Not on file  Social Connections: Not on file  Intimate Partner Violence: Not on file    Review of Systems: All other review of systems negative except as mentioned in the HPI.  Physical Exam: Vital signs BP 115/71   Pulse 80   Temp (!) 97.3 F (36.3 C)   Ht '5\' 1"'$  (1.549 m)   Wt 192 lb (87.1 kg)   LMP  (LMP Unknown) Comment: Not sexually active  SpO2 98%   BMI 36.28 kg/m   General:   Alert,  Well-developed, pleasant and cooperative in NAD Lungs:  Clear throughout to  auscultation.   Heart:  Regular rate and rhythm Abdomen:  Soft, nontender and nondistended.   Neuro/Psych:  Alert and cooperative. Normal mood and affect. A and O x 3  Jolly Mango, MD [facility]

## 2022-02-16 NOTE — Op Note (Signed)
[place] Patient Name: Krista Campbell Procedure Date: 02/16/2022 10:28 AM MRN: [id number] Endoscopist: Remo Lipps P. Havery Moros , MD Age: 51 Referring MD:  Date of Birth: [birth year] Gender: Female Account #: [id number] Procedure:                Colonoscopy Indications:              Screening for colorectal malignant neoplasm, This                            is the patient's first colonoscopy Medicines:                Monitored Anesthesia Care Procedure:                Pre-Anesthesia Assessment:                           - Prior to the procedure, a History and Physical                            was performed, and patient medications and                            allergies were reviewed. The patient's tolerance of                            previous anesthesia was also reviewed. The risks                            and benefits of the procedure and the sedation                            options and risks were discussed with the patient.                            All questions were answered, and informed consent                            was obtained. Prior Anticoagulants: The patient has                            taken no previous anticoagulant or antiplatelet                            agents. ASA Grade Assessment: II - A patient with                            mild systemic disease. After reviewing the risks                            and benefits, the patient was deemed in                            satisfactory condition to undergo the procedure.  After obtaining informed consent, the colonoscope                            was passed under direct vision. Throughout the                            procedure, the patient's blood pressure, pulse, and                            oxygen saturations were monitored continuously. The                            PCF-HQ190L Colonoscope was introduced through the                            anus and  advanced to the the cecum, identified by                            appendiceal orifice and ileocecal valve. The                            colonoscopy was performed without difficulty. The                            patient tolerated the procedure well. The quality                            of the bowel preparation was good. The ileocecal                            valve, appendiceal orifice, and rectum were                            photographed. Scope In: 10:36:05 AM Scope Out: 10:55:44 AM Scope Withdrawal Time: 0 hours 14 minutes 22 seconds  Total Procedure Duration: 0 hours 19 minutes 39 seconds  Findings:                 The perianal and digital rectal examinations were                            normal.                           A few small-mouthed diverticula were found in the                            sigmoid colon.                           Two sessile polyps were found in the sigmoid colon.                            The polyps were 3 mm in size. These polyps were  removed with a cold snare. Resection and retrieval                            were complete.                           Internal hemorrhoids were found during retroflexion.                           The exam was otherwise without abnormality. Complications:            No immediate complications. Estimated blood loss:                            Minimal. Estimated Blood Loss:     Estimated blood loss was minimal. Impression:               - Diverticulosis in the sigmoid colon.                           - Two 3 mm polyps in the sigmoid colon, removed                            with a cold snare. Resected and retrieved.                           - Internal hemorrhoids.                           - The examination was otherwise normal. Recommendation:           - Patient has a contact number available for                            emergencies. The signs and symptoms of potential                             delayed complications were discussed with the                            patient. Return to normal activities tomorrow.                            Written discharge instructions were provided to the                            patient.                           - Resume previous diet.                           - Continue present medications.                           - Await pathology results. Remo Lipps P. Gregg Winchell, MD 02/16/2022 11:01:51 AM This report has been signed electronically.

## 2022-02-16 NOTE — Patient Instructions (Signed)
Handout on polyps, diverticulosis, and hemorrhoids provided   Await pathology results.   Continue current medications.   YOU HAD AN ENDOSCOPIC PROCEDURE TODAY AT [facility]:   Refer to the procedure report that was given to you for any specific questions about what was found during the examination.  If the procedure report does not answer your questions, please call your gastroenterologist to clarify.  If you requested that your care partner not be given the details of your procedure findings, then the procedure report has been included in a sealed envelope for you to review at your convenience later.  YOU SHOULD EXPECT: Some feelings of bloating in the abdomen. Passage of more gas than usual.  Walking can help get rid of the air that was put into your GI tract during the procedure and reduce the bloating. If you had a lower endoscopy (such as a colonoscopy or flexible sigmoidoscopy) you may notice spotting of blood in your stool or on the toilet paper. If you underwent a bowel prep for your procedure, you may not have a normal bowel movement for a few days.  Please Note:  You might notice some irritation and congestion in your nose or some drainage.  This is from the oxygen used during your procedure.  There is no need for concern and it should clear up in a day or so.  SYMPTOMS TO REPORT IMMEDIATELY:  Following lower endoscopy (colonoscopy or flexible sigmoidoscopy):  Excessive amounts of blood in the stool  Significant tenderness or worsening of abdominal pains  Swelling of the abdomen that is new, acute  Fever of 100F or higher  For urgent or emergent issues, a gastroenterologist can be reached at any hour by calling [phone]. Do not use MyChart messaging for urgent concerns.    DIET:  We do recommend a small meal at first, but then you may proceed to your regular diet.  Drink plenty of fluids but you should avoid alcoholic beverages for 24 hours.  ACTIVITY:   You should plan to take it easy for the rest of today and you should NOT DRIVE or use heavy machinery until tomorrow (because of the sedation medicines used during the test).    FOLLOW UP: Our staff will call the number listed on your records the next business day following your procedure.  We will call around 7:15- 8:00 am to check on you and address any questions or concerns that you may have regarding the information given to you following your procedure. If we do not reach you, we will leave a message.  If you develop any symptoms (ie: fever, flu-like symptoms, shortness of breath, cough etc.) before then, please call [phone].  If you test positive for Covid 19 in the 2 weeks post procedure, please call and report this information to [place].    If any biopsies were taken you will be contacted by phone or by letter within the next 1-3 weeks.  Please call us at [phone] if you have not heard about the biopsies in 3 weeks.    SIGNATURES/CONFIDENTIALITY: You and/or your care partner have signed paperwork which will be entered into your electronic medical record.  These signatures attest to the fact that that the information above on your After Visit Summary has been reviewed and is understood.  Full responsibility of the confidentiality of this discharge information lies with you and/or your care-partner.

## 2022-02-16 NOTE — Progress Notes (Signed)
PT taken to PACU. Monitors in place. VSS. Report given to RN. 

## 2022-02-16 NOTE — Progress Notes (Signed)
Called to room to assist during endoscopic procedure.  Patient ID and intended procedure confirmed with present staff. Received instructions for my participation in the procedure from the performing physician.  

## 2022-02-19 ENCOUNTER — Telehealth: Payer: Self-pay | Admitting: *Deleted

## 2022-02-19 NOTE — Telephone Encounter (Signed)
  Follow up Call-     02/16/2022    9:38 AM  Call back number  Post procedure Call Back phone  # [phone]  Permission to leave phone message Yes     Patient questions:  Do you have a fever, pain , or abdominal swelling? No. Pain Score  0 *  Have you tolerated food without any problems? Yes.    Have you been able to return to your normal activities? Yes.    Do you have any questions about your discharge instructions: Diet   No. Medications  No. Follow up visit  No.  Do you have questions or concerns about your Care? No.  Actions: * If pain score is 4 or above: No action needed, pain <4.

## 2022-03-26 DIAGNOSIS — E119 Type 2 diabetes mellitus without complications: Secondary | ICD-10-CM | POA: Diagnosis not present

## 2022-03-26 DIAGNOSIS — H5213 Myopia, bilateral: Secondary | ICD-10-CM | POA: Diagnosis not present

## 2022-03-26 DIAGNOSIS — H2513 Age-related nuclear cataract, bilateral: Secondary | ICD-10-CM | POA: Diagnosis not present

## 2022-04-06 ENCOUNTER — Other Ambulatory Visit: Payer: Self-pay | Admitting: Family Medicine

## 2022-04-06 DIAGNOSIS — Z794 Long term (current) use of insulin: Secondary | ICD-10-CM

## 2022-04-06 DIAGNOSIS — E1149 Type 2 diabetes mellitus with other diabetic neurological complication: Secondary | ICD-10-CM

## 2022-04-06 NOTE — Telephone Encounter (Signed)
Requested medication (s) are due for refill today: yes  Requested medication (s) are on the active medication list: yes  Last refill:  11/08/21 #15/1  Future visit scheduled: yes  Notes to clinic:  Unable to refill per protocol, medication not assigned to the refill protocol.    Requested Prescriptions  Pending Prescriptions Disp Refills   SEMGLEE, YFGN, 100 UNIT/ML Pen [Pharmacy Med Name: SEMGLEE (YFGN)100U/ML PF  PEN 3ML] 15 mL 1    Sig: ADMINISTER 36 UNITS UNDER THE SKIN DAILY     Off-Protocol Failed - 04/06/2022  8:05 AM      Failed - Medication not assigned to a protocol, review manually.      Passed - Valid encounter within last 12 months    Recent Outpatient Visits           4 months ago Annual physical exam   [place], [place], MD   5 months ago Type 2 diabetes mellitus with other neurologic complication, with long-term current use of insulin ([place])   [place], [place], MD   1 year ago Type 2 diabetes mellitus with other neurologic complication, with long-term current use of insulin [facility])   [place], Jarome Matin, RPH-CPP   1 year ago Type 2 diabetes mellitus with other neurologic complication, with long-term current use of insulin [facility])   [place], Jarome Matin, RPH-CPP   1 year ago Type 2 diabetes mellitus with other neurologic complication, with long-term current use of insulin ([place])   [place], [place], MD       Future Appointments             In 1 month Charlott Rakes, MD [place]

## 2022-04-26 ENCOUNTER — Encounter: Payer: Self-pay | Admitting: Family Medicine

## 2022-05-31 ENCOUNTER — Encounter: Payer: Self-pay | Admitting: Family Medicine

## 2022-05-31 ENCOUNTER — Ambulatory Visit: Payer: BC Managed Care – PPO | Attending: Family Medicine | Admitting: Family Medicine

## 2022-05-31 VITALS — BP 106/72 | HR 84 | Temp 98.3°F | Ht 61.0 in | Wt 185.8 lb

## 2022-05-31 DIAGNOSIS — Z23 Encounter for immunization: Secondary | ICD-10-CM

## 2022-05-31 DIAGNOSIS — E785 Hyperlipidemia, unspecified: Secondary | ICD-10-CM | POA: Diagnosis not present

## 2022-05-31 DIAGNOSIS — E1169 Type 2 diabetes mellitus with other specified complication: Secondary | ICD-10-CM

## 2022-05-31 DIAGNOSIS — Z794 Long term (current) use of insulin: Secondary | ICD-10-CM

## 2022-05-31 DIAGNOSIS — K0889 Other specified disorders of teeth and supporting structures: Secondary | ICD-10-CM | POA: Diagnosis not present

## 2022-05-31 DIAGNOSIS — E1149 Type 2 diabetes mellitus with other diabetic neurological complication: Secondary | ICD-10-CM | POA: Diagnosis not present

## 2022-05-31 LAB — POCT GLYCOSYLATED HEMOGLOBIN (HGB A1C): HbA1c, POC (controlled diabetic range): 7.5 % — AB (ref 0.0–7.0)

## 2022-05-31 MED ORDER — SEMAGLUTIDE (1 MG/DOSE) 4 MG/3ML ~~LOC~~ SOPN: 1.0000 mg | PEN_INJECTOR | SUBCUTANEOUS | 0 refills | Status: DC

## 2022-05-31 MED ORDER — SEMAGLUTIDE (2 MG/DOSE) 8 MG/3ML ~~LOC~~ SOPN: 2.0000 mg | PEN_INJECTOR | SUBCUTANEOUS | 6 refills | Status: DC

## 2022-05-31 MED ORDER — INSULIN GLARGINE-YFGN 100 UNIT/ML ~~LOC~~ SOPN: PEN_INJECTOR | SUBCUTANEOUS | 6 refills | Status: DC

## 2022-05-31 NOTE — Patient Instructions (Signed)

## 2022-05-31 NOTE — Progress Notes (Signed)
Subjective:  Patient ID: Krista Campbell, female    DOB: 07-Nov-1970  Age: 51 y.o. MRN: [id number]  CC: Diabetes   HPI MURLINE WEIGEL is a 51 y.o. year old female with a history of type 2 diabetes mellitus (A1c 7.5), hyperlipidemia, obesity.  Interval History:  A1c is 7.5 up from 6.1 previously and she endorses adherence with her medications.  She has not had hypoglycemic episodes and does not check her blood sugars regularly.  The major form of exercise she gets is walking about twice a week.  Adherent with a diabetic diet. Doing well on her statin.  Requests a dental referral for root canal. Past Medical History:  Diagnosis Date   Allergy    mild   Diabetes mellitus without complication (HCC)    GERD (gastroesophageal reflux disease)    Hyperlipidemia     No past surgical history on file.  Family History  Problem Relation Age of Onset   Kidney disease Father    Breast cancer Paternal Aunt    Colon cancer Neg Hx    Colon polyps Neg Hx    Esophageal cancer Neg Hx    Rectal cancer Neg Hx    Stomach cancer Neg Hx     Social History   Socioeconomic History   Marital status: Significant Other    Spouse name: Not on file   Number of children: Not on file   Years of education: Not on file   Highest education level: Not on file  Occupational History   Not on file  Tobacco Use   Smoking status: Never   Smokeless tobacco: Never  Vaping Use   Vaping Use: Never used  Substance and Sexual Activity   Alcohol use: No   Drug use: No   Sexual activity: Not on file  Other Topics Concern   Not on file  Social History Narrative   Not on file   Social Determinants of Health   Financial Resource Strain: Not on file  Food Insecurity: Not on file  Transportation Needs: Not on file  Physical Activity: Not on file  Stress: Not on file  Social Connections: Not on file    Allergies  Allergen Reactions   Metformin And Related Diarrhea    Outpatient Medications  Prior to Visit  Medication Sig Dispense Refill   atorvastatin (LIPITOR) 20 MG tablet TAKE 1 TABLET BY MOUTH DAILY AT 6 PM. 30 tablet 6   Bayer Microlet Lancets lancets Use to check blood sugar 3 times daily as directed. E11.9. Z79.4 100 each 11   Blood Glucose Monitoring Suppl (CONTOUR BLOOD GLUCOSE SYSTEM) DEVI Use to check blood sugar 3 times daily as directed. E11.9. Z79.4 1 each 0   glucose blood test strip Use to check blood sugar 3 times daily as directed. E11.9. Z79.4 100 each 12   Insulin Pen Needle (TRUEPLUS PEN NEEDLES) 32G X 4 MM MISC USE AS DIRECTED 100 each 11   Insulin Syringe-Needle U-100 (BD INSULIN SYRINGE ULTRAFINE) 31G X 15/64" 0.5 ML MISC USE AS DIRECTED 100 each 12   Semaglutide,0.25 or 0.5MG/DOS, (OZEMPIC, 0.25 OR 0.5 MG/DOSE,) 2 MG/1.5ML SOPN INJECT 0.5 MG UNDER THE SKIN ONCE A WEEK 1.5 mL 6   SEMGLEE, YFGN, 100 UNIT/ML Pen ADMINISTER 36 UNITS UNDER THE SKIN DAILY 15 mL 1   cyclobenzaprine (FLEXERIL) 5 MG tablet cyclobenzaprine 5 mg tablet  TAKE 1 TABLET BY MOUTH THREE TIMES DAILY AS NEEDED FOR MUSCLE SPASM (Patient not taking: Reported on 01/23/2022)  Magnesium 400 MG TABS Take 400 mg by mouth 2 (two) times daily. (Patient not taking: Reported on 01/23/2022) 60 tablet 2   naproxen (NAPROSYN) 500 MG tablet Take 1 tablet (500 mg total) by mouth 2 (two) times daily with a meal. (Patient not taking: Reported on 01/23/2022) 30 tablet 3   Facility-Administered Medications Prior to Visit  Medication Dose Route Frequency Provider Last Rate Last Admin   dextrose 5 % solution   Intravenous Continuous Armbruster, Carlota Raspberry, MD         ROS Review of Systems  Constitutional:  Negative for activity change and appetite change.  HENT:  Negative for sinus pressure and sore throat.   Respiratory:  Negative for chest tightness, shortness of breath and wheezing.   Cardiovascular:  Negative for chest pain and palpitations.  Gastrointestinal:  Negative for abdominal distention, abdominal  pain and constipation.  Genitourinary: Negative.   Musculoskeletal: Negative.   Psychiatric/Behavioral:  Negative for behavioral problems and dysphoric mood.     Objective:  BP 106/72   Pulse 84   Temp 98.3 F (36.8 C) (Oral)   Ht '5\' 1"'  (1.549 m)   Wt 185 lb 12.8 oz (84.3 kg)   SpO2 97%   BMI 35.11 kg/m      05/31/2022    8:38 AM 02/16/2022   11:21 AM 02/16/2022   11:11 AM  BP/Weight  Systolic BP 315 176 160  Diastolic BP 72 66 63  Wt. (Lbs) 185.8    BMI 35.11 kg/m2        Physical Exam Constitutional:      Appearance: She is well-developed.  Cardiovascular:     Rate and Rhythm: Normal rate.     Heart sounds: Normal heart sounds. No murmur heard. Pulmonary:     Effort: Pulmonary effort is normal.     Breath sounds: Normal breath sounds. No wheezing or rales.  Chest:     Chest wall: No tenderness.  Abdominal:     General: Bowel sounds are normal. There is no distension.     Palpations: Abdomen is soft. There is no mass.     Tenderness: There is no abdominal tenderness.  Musculoskeletal:        General: Normal range of motion.     Right lower leg: No edema.     Left lower leg: No edema.  Neurological:     Mental Status: She is alert and oriented to person, place, and time.  Psychiatric:        Mood and Affect: Mood normal.        Latest Ref Rng & Units 10/16/2021    8:35 AM 12/05/2020   12:02 PM 08/26/2019    2:49 PM  CMP  Glucose 70 - 99 mg/dL 65  229  140   BUN 6 - 24 mg/dL '13  9  9   ' Creatinine 0.57 - 1.00 mg/dL 0.93  0.93  0.88   Sodium 134 - 144 mmol/L 136  140  138   Potassium 3.5 - 5.2 mmol/L 3.9  4.7  3.6   Chloride 96 - 106 mmol/L 98  97  97   CO2 20 - 29 mmol/L '24  26  27   ' Calcium 8.7 - 10.2 mg/dL 9.8  10.0  9.7   Total Protein 6.0 - 8.5 g/dL 7.3  7.8  7.2   Total Bilirubin 0.0 - 1.2 mg/dL 0.5  0.5  0.7   Alkaline Phos 44 - 121 IU/L 141  157  92  AST 0 - 40 IU/L 29  41  19   ALT 0 - 32 IU/L 25  78  15     Lipid Panel     Component  Value Date/Time   CHOL 203 (H) 10/16/2021 0835   TRIG 156 (H) 10/16/2021 0835   HDL 69 10/16/2021 0835   CHOLHDL 3.8 12/05/2020 1202   CHOLHDL 2.2 10/19/2015 1001   VLDL 9 10/19/2015 1001   LDLCALC 107 (H) 10/16/2021 0835    CBC    Component Value Date/Time   WBC 7.5 10/16/2021 0835   WBC 4.9 10/19/2015 1001   RBC 4.05 10/16/2021 0835   RBC 4.17 10/19/2015 1001   HGB 11.3 10/16/2021 0835   HCT 35.5 10/16/2021 0835   PLT 286 10/16/2021 0835   MCV 88 10/16/2021 0835   MCH 27.9 10/16/2021 0835   MCH 28.3 10/19/2015 1001   MCHC 31.8 10/16/2021 0835   MCHC 32.2 10/19/2015 1001   RDW 13.5 10/16/2021 0835   LYMPHSABS 3.1 10/16/2021 0835   MONOABS 0.7 05/19/2014 1155   EOSABS 0.2 10/16/2021 0835   BASOSABS 0.0 10/16/2021 0835    Lab Results  Component Value Date   HGBA1C 7.5 (A) 05/31/2022    Assessment & Plan:  1. Type 2 diabetes mellitus with other neurologic complication, with long-term current use of insulin (HCC) Suboptimally controlled with A1c of 7.5; goal is less than 7.0 Increase Ozempic dose from 0.5 to 1 mg and after 1 month she will move up to 2 mg Decreased long-acting insulin dose by 2 units and she has been advised to further decrease by 2 units if she notices hypoglycemia Counseled on Diabetic diet, my plate method, 355 minutes of moderate intensity exercise/week Blood sugar logs with fasting goals of 80-120 mg/dl, random of less than 180 and in the event of sugars less than 60 mg/dl or greater than 400 mg/dl encouraged to notify the clinic. Advised on the need for annual eye exams, annual foot exams, Pneumonia vaccine. - POCT glycosylated hemoglobin (Hb A1C) - Semaglutide, 1 MG/DOSE, 4 MG/3ML SOPN; Inject 1 mg as directed once a week. For 1 month then increase to 2 mg once a week thereafter  Dispense: 3 mL; Refill: 0 - Semaglutide, 2 MG/DOSE, 8 MG/3ML SOPN; Inject 2 mg as directed once a week.  Dispense: 3 mL; Refill: 6 - insulin glargine-yfgn (SEMGLEE, YFGN,)  100 UNIT/ML Pen; ADMINISTER 36 UNITS UNDER THE SKIN DAILY  Dispense: 30 mL; Refill: 6 - CMP14+EGFR - LP+Non-HDL Cholesterol  2. Need for immunization against influenza - Flu Vaccine QUAD 7moIM (Fluarix, Fluzone & Alfiuria Quad PF)  3. Pain, dental - Ambulatory referral to Dentistry  4.  Hyperlipidemia associated with type 2 diabetes mellitus LDL of 103 above goal of less than 100 We will check lipid panel and adjust regimen accordingly.   Meds ordered this encounter  Medications   Semaglutide, 1 MG/DOSE, 4 MG/3ML SOPN    Sig: Inject 1 mg as directed once a week. For 1 month then increase to 2 mg once a week thereafter    Dispense:  3 mL    Refill:  0   Semaglutide, 2 MG/DOSE, 8 MG/3ML SOPN    Sig: Inject 2 mg as directed once a week.    Dispense:  3 mL    Refill:  6   insulin glargine-yfgn (SEMGLEE, YFGN,) 100 UNIT/ML Pen    Sig: ADMINISTER 36 UNITS UNDER THE SKIN DAILY    Dispense:  30 mL  Refill:  6    Follow-up: Return in about 6 months (around 11/30/2022) for Chronic medical conditions.       Charlott Rakes, MD, FAAFP. [facility] and [place], [place]   05/31/2022, 12:19 PM

## 2022-06-01 LAB — CMP14+EGFR
ALT: 18 IU/L (ref 0–32)
AST: 22 IU/L (ref 0–40)
Albumin/Globulin Ratio: 1.6 (ref 1.2–2.2)
Albumin: 4.7 g/dL (ref 3.9–4.9)
Alkaline Phosphatase: 107 IU/L (ref 44–121)
BUN/Creatinine Ratio: 11 (ref 9–23)
BUN: 11 mg/dL (ref 6–24)
Bilirubin Total: 0.5 mg/dL (ref 0.0–1.2)
CO2: 27 mmol/L (ref 20–29)
Calcium: 9.7 mg/dL (ref 8.7–10.2)
Chloride: 97 mmol/L (ref 96–106)
Creatinine, Ser: 0.98 mg/dL (ref 0.57–1.00)
Globulin, Total: 3 g/dL (ref 1.5–4.5)
Glucose: 160 mg/dL — ABNORMAL HIGH (ref 70–99)
Potassium: 3.5 mmol/L (ref 3.5–5.2)
Sodium: 138 mmol/L (ref 134–144)
Total Protein: 7.7 g/dL (ref 6.0–8.5)
eGFR: 70 mL/min/{1.73_m2} (ref >59)

## 2022-06-01 LAB — LP+NON-HDL CHOLESTEROL
Cholesterol, Total: 166 mg/dL (ref 100–199)
HDL: 58 mg/dL (ref >39)
LDL Chol Calc (NIH): 82 mg/dL (ref 0–99)
Total Non-HDL-Chol (LDL+VLDL): 108 mg/dL (ref 0–129)
Triglycerides: 150 mg/dL — ABNORMAL HIGH (ref 0–149)
VLDL Cholesterol Cal: 26 mg/dL (ref 5–40)

## 2022-08-09 ENCOUNTER — Other Ambulatory Visit: Payer: Self-pay | Admitting: Family Medicine

## 2022-08-09 DIAGNOSIS — E1149 Type 2 diabetes mellitus with other diabetic neurological complication: Secondary | ICD-10-CM

## 2022-08-10 ENCOUNTER — Other Ambulatory Visit: Payer: Self-pay

## 2022-08-10 ENCOUNTER — Other Ambulatory Visit (HOSPITAL_COMMUNITY): Payer: Self-pay

## 2022-08-10 MED ORDER — INSULIN GLARGINE-YFGN 100 UNIT/ML ~~LOC~~ SOPN
36.0000 [IU] | PEN_INJECTOR | Freq: Every day | SUBCUTANEOUS | 2 refills | Status: DC
Start: 2022-08-09 — End: 2022-12-03
  Filled 2022-08-10: qty 9, 25d supply, fill #0
  Filled 2022-10-10: qty 9, 25d supply, fill #1

## 2022-08-17 ENCOUNTER — Other Ambulatory Visit (HOSPITAL_COMMUNITY): Payer: Self-pay

## 2022-08-23 ENCOUNTER — Telehealth: Payer: Self-pay | Admitting: Family Medicine

## 2022-08-23 ENCOUNTER — Ambulatory Visit: Payer: Self-pay

## 2022-08-23 NOTE — Telephone Encounter (Signed)
  Chief Complaint: hypoglycemia Symptoms: BS 50s past 2 mornings, feeling tired and sluggish, blurred vision at times  Frequency: 2 mornings  Pertinent Negatives: Patient denies shaky or  Disposition: '[]'$ ED /'[]'$ Urgent Care (no appt availability in office) / '[]'$ Appointment(In office/virtual)/ '[]'$  [facility]/ '[]'$ Home Care/ '[]'$ Refused Recommended Disposition /'[]'$ [place] [facility]/ '[]'$  Follow-up with PCP Additional Notes: pt states she has noticed that she has been feeling tired and sluggish the past few days. Pt works at nights so comes home around 0800 and has checked BS past 2 mornings and levels have been in 50s. Pt states she ate breakfast yesterday morning and it came up to 79 then this morning only ate skittles and it come up to 66. She has been taking Ozempic '1mg'$  for 4 weeks on Monday's and then taking Semglee 36 units at night 2100-2200. Pt is unsure if medications are too much or if its where she works at nights even though that hasn't changed. I scheduled pt for VV on 08/27/22 d/t soonest appt and advised pt of care advice and recommended she check BS in morning when coming home from work, eating a breakfast and depending on if low can check again and then checking when she wakes up around 1500-1600 in evening. Also advised of BS and sx of when to go to ED. Pt verbalized understanding. She is needing refill on Ozempic '1mg'$  as well but advised that may want to hold off on refilling that dosage until having VV but would send to provider for review.   Summary: blood sugar levels   Patient states that her blood sugar levels are low, yesterday and today her readings are in the 50's. Per patient she is feeling tired and sleepy. Patient states that her medication for her diabetes has changed.  Please advise         Reason for Disposition  [1] Morning (before breakfast) blood glucose < 80 mg/dL (4.4 mmol/L) AND [2] more than once in past week  Answer Assessment - Initial Assessment  Questions 1. SYMPTOMS: "What symptoms are you concerned about?"     BS low and feeling tired and sleepy  2. ONSET:  "When did the symptoms start?"     Started at beginning of Jan.  3. BLOOD GLUCOSE: "What is your blood glucose level?"      50s 4. USUAL RANGE: "What is your blood glucose level usually?" (e.g., usual fasting morning value, usual evening value)     Don't check it normally 5. TYPE 1 or 2:  "Do you know what type of diabetes you have?"  (e.g., Type 1, Type 2, Gestational; doesn't know)      DM 2 6. INSULIN: "Do you take insulin?" "What type of insulin(s) do you use? What is the mode of delivery? (syringe, pen; injection or pump) "When did you last give yourself an insulin dose?" (i.e., time or hours/minutes ago) "How much did you give?" (i.e., how many units)     Insulin glargine  8. OTHER SYMPTOMS: "Do you have any symptoms?" (e.g., fever, frequent urination, difficulty breathing, vomiting)     no  Protocols used: Diabetes - Low Blood Sugar-A-AH

## 2022-08-23 NOTE — Telephone Encounter (Signed)
Pt has been informed of PCP response and to keep upcoming appointment.

## 2022-08-23 NOTE — Telephone Encounter (Signed)
FYI pt has appointment on 08/27/2022 with Dr.Johnson

## 2022-08-23 NOTE — Telephone Encounter (Signed)
At her last visit, I had advised her to decrease insulin dose by 2 units if hypoglycemia occurred. Please advise to decrease insulin from 36 to 34 units. Thanks

## 2022-08-24 ENCOUNTER — Telehealth: Payer: Self-pay | Admitting: Emergency Medicine

## 2022-08-24 ENCOUNTER — Encounter: Payer: Self-pay | Admitting: Family Medicine

## 2022-08-24 NOTE — Telephone Encounter (Signed)
Decreased insulin as instructed. Last night at 10:00 p.m blood glucose 46. Went up to 40 and then 102 after eating. Help insulin last night. 0300 - 65.Slept after 0300. At 0800 - blood glucose is 60. States "I need to do something different." Feels like Ozempic is causing these low readings. Please advise pt.

## 2022-08-24 NOTE — Telephone Encounter (Signed)
FYI

## 2022-08-24 NOTE — Telephone Encounter (Signed)
Copied from Drain [id number]. Topic: General - Inquiry >> Aug 24, 2022  4:11 PM Erskine Squibb wrote: Reason for CRM: Patient called back checking on high priority message that was sent to the provider about what she should do concerning her sugar levels. A message was routed from triage to the provider as a high priority and she is just worried she will not hear back before the end of the day

## 2022-08-25 NOTE — Telephone Encounter (Signed)
I sent her a mychart message yesterday to her to cut back on her Semglee to 30 units. This morning I called to speak with her and she states she administered 34 units last night and has not had any hypoglycemia. Advised to cut back to 30 units tonight, eat small frequent meals and keep appointment to review blood sugars.

## 2022-08-27 ENCOUNTER — Telehealth (HOSPITAL_BASED_OUTPATIENT_CLINIC_OR_DEPARTMENT_OTHER): Payer: BC Managed Care – PPO | Admitting: Internal Medicine

## 2022-08-27 DIAGNOSIS — E11649 Type 2 diabetes mellitus with hypoglycemia without coma: Secondary | ICD-10-CM | POA: Diagnosis not present

## 2022-08-27 NOTE — Telephone Encounter (Signed)
I spoke with her over the weekend and provided her instructions.

## 2022-08-27 NOTE — Telephone Encounter (Signed)
Patient called and informed to bring blood sugar log to today's appointment with Dr.Johnson.

## 2022-08-27 NOTE — Progress Notes (Signed)
Virtual Visit via Telephone Note  I connected with Krista Campbell on 08/27/2022 at 4:14 PM by telephone and verified that I am speaking with the correct person using two identifiers  Location: Patient: home Provider: office  Participants: Myself Patient   I discussed the limitations, risks, security and privacy concerns of performing an evaluation and management service by telephone and the availability of in person appointments. I also discussed with the patient that there may be a patient responsible charge related to this service. The patient expressed understanding and agreed to proceed.   History of Present Illness: Pt with hx of type 2 diabetes mellitus (A1c 7.5), hyperlipidemia, obesity.  PCP is Dr. Margarita Rana.  This is an urgent care visit for hypoglycemia. Patient with history of DM type II.  She was on Ozempic and Semglee insulin.  On last visit with PCP in October, Semglee was decreased from 38 units to 36 units and Ozempic was increased from 0.5 mg once a week to 1 mg weekly.  Since then until about the middle of December, she had difficulties getting the Ozempic due to [employer] television/film/video.  Then she went several weeks without Semglee because the pharmacy she was using also did not have it in stock.  Finally restarted Ozempic on 07/30/2022.  Semglee was changed to glargine insulin and she finally was able to get that earlier this month.  She was not checking blood sugars regularly but started doing so recently.  On 08/23/2022 around 8 AM, blood sugar was 56. She ate something.  It took about 2 hours for her to finally get it back up to 102.  Following morning, she woke up with blood sugar of 57.  She did not have any symptoms with these low blood sugar episodes.  She called the company from which she gets her diabetic strips and was told that the strips may be faulty causing erroneous readings.  They plan to send out a new batch to her which she thinks she may get tomorrow.  Spoke with Dr.  Margarita Rana over the weekend on 08/25/2022 about the low blood sugars and was told to decrease the insulin to 30 units.  She forgot to take her insulin last night but still woke up this morning with blood sugar reading of 75.   Observations/Objective: No direct observation done as this was a telephone visit.  Assessment and Plan: 1. Hypoglycemia associated with type 2 diabetes mellitus ([place]) Advised patient to decrease the glargine insulin from 30 units to 15 units until she gets her new batch of test strips which she hopes to receive tomorrow. -Also spoke with her about getting a continuous glucose monitor instead so that she is not having to stick herself several times during the day to check blood sugars.  I explained to her how the device works.  She states she wants to think about it. -Wanted to know what she should do today about checking blood sugars because she has only 2 strips left.  She is afraid of blood sugars dropping low again and not having any strips.  I told her that she can purchase a [employer] brand glucometer and test strips to carry her over until her new shipment of test strips gets in   Follow Up Instructions: Keep f/u appt with PCP   I discussed the assessment and treatment plan with the patient. The patient was provided an opportunity to ask questions and all were answered. The patient agreed with the plan and demonstrated an understanding  of the instructions.   The patient was advised to call back or seek an in-person evaluation if the symptoms worsen or if the condition fails to improve as anticipated.  I  Spent 23 minutes on this telephone encounter  This note has been created with Surveyor, quantity. Any transcriptional errors are unintentional.  Karle Plumber, MD

## 2022-08-28 ENCOUNTER — Other Ambulatory Visit (HOSPITAL_COMMUNITY): Payer: Self-pay

## 2022-08-29 ENCOUNTER — Ambulatory Visit: Payer: Self-pay

## 2022-08-29 ENCOUNTER — Other Ambulatory Visit (HOSPITAL_COMMUNITY): Payer: Self-pay

## 2022-08-29 ENCOUNTER — Other Ambulatory Visit: Payer: Self-pay

## 2022-08-29 DIAGNOSIS — E1149 Type 2 diabetes mellitus with other diabetic neurological complication: Secondary | ICD-10-CM

## 2022-08-29 MED ORDER — SEMAGLUTIDE (1 MG/DOSE) 4 MG/3ML ~~LOC~~ SOPN
1.0000 mg | PEN_INJECTOR | SUBCUTANEOUS | 0 refills | Status: DC
  Filled 2022-08-29: qty 3, 28d supply, fill #0

## 2022-08-29 NOTE — Telephone Encounter (Signed)
Requested Prescriptions  Pending Prescriptions Disp Refills   Semaglutide, 1 MG/DOSE, 4 MG/3ML SOPN 3 mL 0    Sig: Inject 1 mg as directed once a week. For 1 month then increase to 2 mg once a week thereafter     Endocrinology:  Diabetes - GLP-1 Receptor Agonists - semaglutide Failed - 08/29/2022 10:46 AM      Failed - HBA1C in normal range and within 180 days    HbA1c, POC (controlled diabetic range)  Date Value Ref Range Status  05/31/2022 7.5 (A) 0.0 - 7.0 % Final         Passed - Cr in normal range and within 360 days    Creat  Date Value Ref Range Status  06/28/2016 0.79 0.50 - 1.10 mg/dL Final   Creatinine, Ser  Date Value Ref Range Status  05/31/2022 0.98 0.57 - 1.00 mg/dL Final   Creatinine, Urine  Date Value Ref Range Status  06/28/2016 83 20 - 320 mg/dL Final         Passed - Valid encounter within last 6 months    Recent Outpatient Visits           2 days ago Hypoglycemia associated with type 2 diabetes mellitus ([place])   [place] Ladell Pier, MD   3 months ago Need for immunization against influenza   [place], [place], MD   9 months ago Annual physical exam   [place], [place], MD   10 months ago Type 2 diabetes mellitus with other neurologic complication, with long-term current use of insulin ([place])   [place], Charlane Ferretti, MD   1 year ago Type 2 diabetes mellitus with other neurologic complication, with long-term current use of insulin [facility])   [place], RPH-CPP       Future Appointments             In 3 months Charlott Rakes, MD [place]

## 2022-08-29 NOTE — Telephone Encounter (Signed)
  Chief Complaint: medication refill Symptoms: NA, BS fasting 101-112 Frequency: 3-4 days  Pertinent Negatives: NA Disposition: '[]'$ ED /'[]'$ Urgent Care (no appt availability in office) / '[]'$ Appointment(In office/virtual)/ '[]'$  [place] [facility]/ '[x]'$ Home Care/ '[]'$ Refused Recommended Disposition /'[]'$ [place] [facility]/ '[]'$  Follow-up with PCP Additional Notes: pt needing refill on Ozempic '1mg'$  and wanting sent to [place]. Pt states she has gotten new test strips and went to old meter and readings have been much better and she feels better. Has been doing 15 units of insulin and is out of Ozempic but doesn't feel comfortable going to '2mg'$  yet so wanting refill on '1mg'$  for now and will increase to '2mg'$  after this month supply of '1mg'$  Ozempic. Pt asking about where her BS should be and gave card advice of BS fasting should be > 80 and after meal 1-2 hrs should be < 180. 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 Pt verbalized understanding and will update with any changes.   Summary: Pt unsure of dosage of med   Pt wants a refill on Semaglutide, 1 MG/DOSE, 4 MG/3ML SOPN or Disp Refills Start End Semaglutide, 2 MG/DOSE, 8 MG/3ML SOPN Pt is unsure of what dosage she is on states a there was some lag time in between being able to obtain, Pt wants to use a different phamacy but only can till me of a building it is beside of, Pt is wanting the med and is not really wanting to disclose information stating I should know the name and dosage of med yet when I thought '2mg'$  dose she wanted 1 mg.please fu [phone]         Reason for Disposition  [1] Caller requesting a prescription renewal (no refills left), no triage required, AND [2] triager able to renew prescription per department policy  Answer Assessment - Initial Assessment Questions 1. DRUG NAME: "What medicine do you need to have refilled?"      Ozempic  2. REFILLS REMAINING: "How many refills are remaining?" (Note: The label on the medicine or pill bottle will show how many refills are remaining. If there are no refills remaining, then a renewal may be needed.)     0 4. PRESCRIBING HCP: "Who prescribed it?" Reason: If prescribed by specialist, call should be referred to that group.     Dr. Margarita Rana  Protocols used: Medication Refill and Renewal Call-A-AH

## 2022-08-30 ENCOUNTER — Other Ambulatory Visit (HOSPITAL_COMMUNITY): Payer: Self-pay

## 2022-10-10 ENCOUNTER — Other Ambulatory Visit (HOSPITAL_COMMUNITY): Payer: Self-pay

## 2022-11-06 ENCOUNTER — Other Ambulatory Visit: Payer: Self-pay | Admitting: Family Medicine

## 2022-11-06 DIAGNOSIS — E785 Hyperlipidemia, unspecified: Secondary | ICD-10-CM

## 2022-11-06 NOTE — Telephone Encounter (Signed)
Requested Prescriptions  Pending Prescriptions Disp Refills   atorvastatin (LIPITOR) 20 MG tablet [Pharmacy Med Name: ATORVASTATIN 20MG  TABLETS] 30 tablet 6    Sig: TAKE 1 TABLET BY MOUTH DAILY AT 6 PM     Cardiovascular:  Antilipid - Statins Failed - 11/06/2022  3:10 AM      Failed - Lipid Panel in normal range within the last 12 months    Cholesterol, Total  Date Value Ref Range Status  05/31/2022 166 100 - 199 mg/dL Final   LDL Chol Calc (NIH)  Date Value Ref Range Status  05/31/2022 82 0 - 99 mg/dL Final   HDL  Date Value Ref Range Status  05/31/2022 58 >39 mg/dL Final   Triglycerides  Date Value Ref Range Status  05/31/2022 150 (H) 0 - 149 mg/dL Final         Passed - Patient is not pregnant      Passed - Valid encounter within last 12 months    Recent Outpatient Visits           2 months ago Hypoglycemia associated with type 2 diabetes mellitus ([place])   [place] Ladell Pier, MD   5 months ago Need for immunization against influenza   [place], MD   11 months ago Annual physical exam   [place], Charlane Ferretti, MD   1 year ago Type 2 diabetes mellitus with other neurologic complication, with long-term current use of insulin [facility])   [place], Charlane Ferretti, MD   1 year ago Type 2 diabetes mellitus with other neurologic complication, with long-term current use of insulin [facility])   [place], RPH-CPP       Future Appointments             In 3 weeks Charlott Rakes, MD [place]

## 2022-12-03 ENCOUNTER — Ambulatory Visit: Payer: BC Managed Care – PPO | Attending: Family Medicine | Admitting: Family Medicine

## 2022-12-03 ENCOUNTER — Other Ambulatory Visit: Payer: Self-pay

## 2022-12-03 ENCOUNTER — Encounter: Payer: Self-pay | Admitting: Family Medicine

## 2022-12-03 VITALS — BP 114/79 | HR 76 | Wt 170.4 lb

## 2022-12-03 DIAGNOSIS — E1149 Type 2 diabetes mellitus with other diabetic neurological complication: Secondary | ICD-10-CM

## 2022-12-03 DIAGNOSIS — E785 Hyperlipidemia, unspecified: Secondary | ICD-10-CM | POA: Diagnosis not present

## 2022-12-03 DIAGNOSIS — Z794 Long term (current) use of insulin: Secondary | ICD-10-CM | POA: Diagnosis not present

## 2022-12-03 DIAGNOSIS — E1169 Type 2 diabetes mellitus with other specified complication: Secondary | ICD-10-CM | POA: Diagnosis not present

## 2022-12-03 LAB — POCT GLYCOSYLATED HEMOGLOBIN (HGB A1C): HbA1c, POC (controlled diabetic range): 14.6 % — AB (ref 0.0–7.0)

## 2022-12-03 LAB — GLUCOSE, POCT (MANUAL RESULT ENTRY)
POC Glucose: 338 mg/dl — AB (ref 70–99)
POC Glucose: 379 mg/dl — AB (ref 70–99)
POC Glucose: 350 mg/dl — AB (ref 70–99)

## 2022-12-03 MED ORDER — INSULIN ASPART 100 UNIT/ML IJ SOLN
6.0000 [IU] | Freq: Once | INTRAMUSCULAR | Status: AC
  Administered 2022-12-03: 6 [IU] via SUBCUTANEOUS

## 2022-12-03 MED ORDER — SEMAGLUTIDE (2 MG/DOSE) 8 MG/3ML ~~LOC~~ SOPN
2.0000 mg | PEN_INJECTOR | SUBCUTANEOUS | 6 refills | Status: DC
  Filled 2022-12-03: qty 3, 28d supply, fill #0
  Filled 2023-02-12: qty 3, 28d supply, fill #1
  Filled 2023-03-18: qty 3, 28d supply, fill #2
  Filled 2023-05-15 – 2023-06-29 (×2): qty 3, 28d supply, fill #3
  Filled 2023-09-17: qty 3, 28d supply, fill #4
  Filled 2023-10-23 – 2023-12-03 (×2): qty 3, 28d supply, fill #0

## 2022-12-03 MED ORDER — INSULIN GLARGINE-YFGN 100 UNIT/ML ~~LOC~~ SOPN
25.0000 [IU] | PEN_INJECTOR | Freq: Every day | SUBCUTANEOUS | 6 refills | Status: DC
  Filled 2022-12-03: qty 6, 24d supply, fill #0
  Filled 2023-02-12: qty 6, 24d supply, fill #1
  Filled 2023-03-18: qty 6, 24d supply, fill #2
  Filled 2023-05-14 – 2023-05-15 (×2): qty 6, 24d supply, fill #3

## 2022-12-03 NOTE — Progress Notes (Signed)
Subjective:  Patient ID: Krista Campbell, female    DOB: [birth year]  Age: 52 y.o. MRN: [id number]  CC: Medication Refill and Diabetes   HPI Krista Campbell is a 52 y.o. year old female with a history of type 2 diabetes mellitus (A1c 14.6), hyperlipidemia, obesity.   Interval History: A1c is 14.6 up from 7.5 previously. She has been without Ozempic x2 weeks as she has been unable to pick it up.  Her mom is also hospital less than a nursing home in [place] and she has been busy running between work and visiting her mom and has not adhere to diabetic diet.  Endorses polydipsia but no neuropathy or visual symptoms. She has been on 15 units of Semglee qhs rather than 36 units on her chart. CBG is 338, last meal was yesterday. Endorses adherence with her statin.  Past Medical History:  Diagnosis Date   Allergy    mild   Diabetes mellitus without complication    GERD (gastroesophageal reflux disease)    Hyperlipidemia     No past surgical history on file.  Family History  Problem Relation Age of Onset   Kidney disease Father    Breast cancer Paternal Aunt    Colon cancer Neg Hx    Colon polyps Neg Hx    Esophageal cancer Neg Hx    Rectal cancer Neg Hx    Stomach cancer Neg Hx     Social History   Socioeconomic History   Marital status: Significant Other    Spouse name: Not on file   Number of children: Not on file   Years of education: Not on file   Highest education level: Not on file  Occupational History   Not on file  Tobacco Use   Smoking status: Never   Smokeless tobacco: Never  Vaping Use   Vaping Use: Never used  Substance and Sexual Activity   Alcohol use: No   Drug use: No   Sexual activity: Not on file  Other Topics Concern   Not on file  Social History Narrative   Not on file   Social Determinants of Health   Financial Resource Strain: Not on file  Food Insecurity: Not on file  Transportation Needs: Not on file  Physical Activity: Not  on file  Stress: Not on file  Social Connections: Not on file    Allergies  Allergen Reactions   Metformin And Related Diarrhea    Outpatient Medications Prior to Visit  Medication Sig Dispense Refill   atorvastatin (LIPITOR) 20 MG tablet TAKE 1 TABLET BY MOUTH DAILY AT 6 PM 30 tablet 5   insulin glargine-yfgn (SEMGLEE) 100 UNIT/ML Pen Inject 36 Units into the skin daily. 15 mL 2   Bayer Microlet Lancets lancets Use to check blood sugar 3 times daily as directed. E11.9. Z79.4 100 each 11   Blood Glucose Monitoring Suppl (CONTOUR BLOOD GLUCOSE SYSTEM) DEVI Use to check blood sugar 3 times daily as directed. E11.9. Z79.4 1 each 0   cyclobenzaprine (FLEXERIL) 5 MG tablet cyclobenzaprine 5 mg tablet  TAKE 1 TABLET BY MOUTH THREE TIMES DAILY AS NEEDED FOR MUSCLE SPASM (Patient not taking: Reported on 01/23/2022)     glucose blood test strip Use to check blood sugar 3 times daily as directed. E11.9. Z79.4 100 each 12   insulin glargine-yfgn (SEMGLEE, YFGN,) 100 UNIT/ML Pen ADMINISTER 36 UNITS UNDER THE SKIN DAILY 15 mL 2   Insulin Pen Needle (TRUEPLUS PEN NEEDLES) 32G X 4  MM MISC USE AS DIRECTED 100 each 11   Insulin Syringe-Needle U-100 (BD INSULIN SYRINGE ULTRAFINE) 31G X 15/64" 0.5 ML MISC USE AS DIRECTED 100 each 12   Magnesium 400 MG TABS Take 400 mg by mouth 2 (two) times daily. (Patient not taking: Reported on 01/23/2022) 60 tablet 2   naproxen (NAPROSYN) 500 MG tablet Take 1 tablet (500 mg total) by mouth 2 (two) times daily with a meal. (Patient not taking: Reported on 01/23/2022) 30 tablet 3   Semaglutide, 1 MG/DOSE, 4 MG/3ML SOPN Inject 1 mg as directed once a week. For 1 month then increase to 2 mg once a week thereafter 3 mL 0   Semaglutide, 2 MG/DOSE, 8 MG/3ML SOPN Inject 2 mg as directed once a week. 3 mL 6   Facility-Administered Medications Prior to Visit  Medication Dose Route Frequency Provider Last Rate Last Admin   dextrose 5 % solution   Intravenous Continuous Armbruster,  Willaim Rayas, MD         ROS Review of Systems  Constitutional:  Negative for activity change and appetite change.  HENT:  Negative for sinus pressure and sore throat.   Respiratory:  Negative for chest tightness, shortness of breath and wheezing.   Cardiovascular:  Negative for chest pain and palpitations.  Gastrointestinal:  Negative for abdominal distention, abdominal pain and constipation.  Genitourinary: Negative.   Musculoskeletal: Negative.   Psychiatric/Behavioral:  Negative for behavioral problems and dysphoric mood.     Objective:  BP 114/79 (BP Location: Right Arm, Patient Position: Sitting, Cuff Size: Normal)   Pulse 76   Wt 170 lb 6.4 oz (77.3 kg)   SpO2 98%   BMI 32.20 kg/m      12/03/2022   10:29 AM 05/31/2022    8:38 AM 02/16/2022   11:21 AM  BP/Weight  Systolic BP 114 106 108  Diastolic BP 79 72 66  Wt. (Lbs) 170.4 185.8   BMI 32.2 kg/m2 35.11 kg/m2       Physical Exam Constitutional:      Appearance: She is well-developed.  Cardiovascular:     Rate and Rhythm: Normal rate.     Heart sounds: Normal heart sounds. No murmur heard. Pulmonary:     Effort: Pulmonary effort is normal.     Breath sounds: Normal breath sounds. No wheezing or rales.  Chest:     Chest wall: No tenderness.  Abdominal:     General: Bowel sounds are normal. There is no distension.     Palpations: Abdomen is soft. There is no mass.     Tenderness: There is no abdominal tenderness.  Musculoskeletal:        General: Normal range of motion.     Right lower leg: No edema.     Left lower leg: No edema.  Neurological:     Mental Status: She is alert and oriented to person, place, and time.  Psychiatric:        Mood and Affect: Mood normal.    Diabetic Foot Exam - Simple   Simple Foot Form Diabetic Foot exam was performed with the following findings: Yes 12/03/2022 11:00 AM  Visual Inspection No deformities, no ulcerations, no other skin breakdown bilaterally: Yes Sensation  Testing Intact to touch and monofilament testing bilaterally: Yes Pulse Check Posterior Tibialis and Dorsalis pulse intact bilaterally: Yes Comments        Latest Ref Rng & Units 05/31/2022    9:22 AM 10/16/2021    8:35 AM 12/05/2020  12:02 PM  CMP  Glucose 70 - 99 mg/dL 161  65  096   BUN 6 - 24 mg/dL 11  13  9    Creatinine 0.57 - 1.00 mg/dL 0.45  4.09  8.11   Sodium 134 - 144 mmol/L 138  136  140   Potassium 3.5 - 5.2 mmol/L 3.5  3.9  4.7   Chloride 96 - 106 mmol/L 97  98  97   CO2 20 - 29 mmol/L 27  24  26    Calcium 8.7 - 10.2 mg/dL 9.7  9.8  91.4   Total Protein 6.0 - 8.5 g/dL 7.7  7.3  7.8   Total Bilirubin 0.0 - 1.2 mg/dL 0.5  0.5  0.5   Alkaline Phos 44 - 121 IU/L 107  141  157   AST 0 - 40 IU/L 22  29  41   ALT 0 - 32 IU/L 18  25  78     Lipid Panel     Component Value Date/Time   CHOL 166 05/31/2022 0922   TRIG 150 (H) 05/31/2022 0922   HDL 58 05/31/2022 0922   CHOLHDL 3.8 12/05/2020 1202   CHOLHDL 2.2 10/19/2015 1001   VLDL 9 10/19/2015 1001   LDLCALC 82 05/31/2022 0922    CBC    Component Value Date/Time   WBC 7.5 10/16/2021 0835   WBC 4.9 10/19/2015 1001   RBC 4.05 10/16/2021 0835   RBC 4.17 10/19/2015 1001   HGB 11.3 10/16/2021 0835   HCT 35.5 10/16/2021 0835   PLT 286 10/16/2021 0835   MCV 88 10/16/2021 0835   MCH 27.9 10/16/2021 0835   MCH 28.3 10/19/2015 1001   MCHC 31.8 10/16/2021 0835   MCHC 32.2 10/19/2015 1001   RDW 13.5 10/16/2021 0835   LYMPHSABS 3.1 10/16/2021 0835   MONOABS 0.7 05/19/2014 1155   EOSABS 0.2 10/16/2021 0835   BASOSABS 0.0 10/16/2021 0835    Lab Results  Component Value Date   HGBA1C 14.6 (A) 12/03/2022    Assessment & Plan:  1. Type 2 diabetes mellitus with other neurologic complication, with long-term current use of insulin Uncontrolled with A1c of 14.6 which has trended up from 7.5 previously Running out of Ozempic and nonadherence to diabetic diet largely contributing Goal A1c is less than 7 NovoLog 6  units administered due to hyperglycemia in clinic oral hydration provided and blood sugar repeated Increase Semglee from 15 units to 25 units I have spoken with the pharmacy and she is able to charge her account today and come pick up her Ozempic  Counseled on Diabetic diet, my plate method, 782 minutes of moderate intensity exercise/week Blood sugar logs with fasting goals of 80-120 mg/dl, random of less than 956 and in the event of sugars less than 60 mg/dl or greater than 213 mg/dl encouraged to notify the clinic. Advised on the need for annual eye exams, annual foot exams, Pneumonia vaccine. POCT glycosylated hemoglobin (Hb A1C) - Microalbumin / creatinine urine ratio - CMP14+EGFR - Semaglutide, 2 MG/DOSE, 8 MG/3ML SOPN; Inject 2 mg as directed once a week.  Dispense: 3 mL; Refill: 6 - insulin glargine-yfgn (SEMGLEE) 100 UNIT/ML Pen; Inject 25 Units into the skin daily.  Dispense: 30 mL; Refill: 6 - insulin aspart (novoLOG) injection 6 Units - POCT glucose (manual entry) - POCT glucose (manual entry)  2. Hyperlipidemia associated with type 2 diabetes mellitus Controlled Low-cholesterol diet - LP+Non-HDL Cholesterol   Meds ordered this encounter  Medications   Semaglutide,  2 MG/DOSE, 8 MG/3ML SOPN    Sig: Inject 2 mg as directed once a week.    Dispense:  3 mL    Refill:  6   insulin glargine-yfgn (SEMGLEE) 100 UNIT/ML Pen    Sig: Inject 25 Units into the skin daily.    Dispense:  30 mL    Refill:  6    Dose change   insulin aspart (novoLOG) injection 6 Units    Follow-up: Return in about 3 months (around 03/04/2023) for Chronic medical conditions.    Visit required 35 minutes of patient care including median intraservice time, reviewing previous notes and test results, coordination of care, counseling the patient in addition to management of chronic medical conditions.Time also spent ordering medications, investigations and documenting in the chart.  All questions were  answered to the patient's satisfaction   Hoy Register, MD, FAAFP. [facility] [place], [place] [phone]   12/03/2022, 12:08 PM

## 2022-12-03 NOTE — Patient Instructions (Signed)

## 2022-12-04 ENCOUNTER — Ambulatory Visit: Payer: Self-pay | Admitting: *Deleted

## 2022-12-04 LAB — CMP14+EGFR
ALT: 28 IU/L (ref 0–32)
AST: 30 IU/L (ref 0–40)
Albumin/Globulin Ratio: 1.6 (ref 1.2–2.2)
Albumin: 4.5 g/dL (ref 3.8–4.9)
Alkaline Phosphatase: 134 IU/L — ABNORMAL HIGH (ref 44–121)
BUN/Creatinine Ratio: 13 (ref 9–23)
BUN: 12 mg/dL (ref 6–24)
Bilirubin Total: 0.8 mg/dL (ref 0.0–1.2)
CO2: 26 mmol/L (ref 20–29)
Calcium: 9.8 mg/dL (ref 8.7–10.2)
Chloride: 93 mmol/L — ABNORMAL LOW (ref 96–106)
Creatinine, Ser: 0.96 mg/dL (ref 0.57–1.00)
Globulin, Total: 2.8 g/dL (ref 1.5–4.5)
Glucose: 308 mg/dL — ABNORMAL HIGH (ref 70–99)
Potassium: 3.5 mmol/L (ref 3.5–5.2)
Sodium: 137 mmol/L (ref 134–144)
Total Protein: 7.3 g/dL (ref 6.0–8.5)
eGFR: 72 mL/min/{1.73_m2} (ref >59)

## 2022-12-04 LAB — LP+NON-HDL CHOLESTEROL
Cholesterol, Total: 173 mg/dL (ref 100–199)
HDL: 66 mg/dL (ref >39)
LDL Chol Calc (NIH): 90 mg/dL (ref 0–99)
Total Non-HDL-Chol (LDL+VLDL): 107 mg/dL (ref 0–129)
Triglycerides: 94 mg/dL (ref 0–149)
VLDL Cholesterol Cal: 17 mg/dL (ref 5–40)

## 2022-12-04 LAB — MICROALBUMIN / CREATININE URINE RATIO
Creatinine, Urine: 45.9 mg/dL
Microalb/Creat Ratio: 48 mg/g creat — ABNORMAL HIGH (ref 0–29)
Microalbumin, Urine: 22 ug/mL

## 2022-12-04 NOTE — Telephone Encounter (Signed)
Called placed to advise patient to continue with the medication instructions given to her in OV with PCP on yesterday. The changes were just made and it well take a little time for her BS to level out. She should avoid sweets,pasta and other food high in carbohydrates, try taking a walk after meal if possible, ensure that she is  hydrated well and continue to keep a Blood Glucose log  with fasting goals of 80-120 mg/dl, random of less than 086 and in the event of sugars less than 60 mg/dl or greater than 578 mg/dl encourage to notify the clinic.  Was unable to reach or leave VM.

## 2022-12-04 NOTE — Telephone Encounter (Signed)
  Chief Complaint: Hyperglycemia Symptoms: Pt seen in OV yesterday, BS 300's. Given insulin at office. States BS "Out of control." States took Ozempic 1:30 yesterday afternoon after OV, BS 177 evening readings 250,  274 at MN, states had not eaten.(Ate pasta at 4:30) This AM 300 "Something." Frequency: Yesterday Pertinent Negatives: Patient denies any associated symptoms. States voiding a lot :Because I am drinking a tremendous amount of water. "  Disposition: ED /[] Urgent Care (no appt availability in office) / Appointment(In office/virtual)/  [place] [facility]/ Home Care/ Refused Recommended Disposition /[] [place] [facility]/  Follow-up with PCP Additional Notes: Pt very anxious regarding BS's. Assured NT would route to practice for PCPs review and final disposition.Care advise provided, pt verbalizes understanding.  Please advise. Reason for Disposition  [1] Blood glucose > 300 mg/dL (16.1 mmol/L) AND [0] two or more times in a row  Answer Assessment - Initial Assessment Questions 1. BLOOD GLUCOSE: "What is your blood glucose level?"      300 "And something"  2. ONSET: "When did you check the blood glucose?"     This AM 3. USUAL RANGE: "What is your glucose level usually?" (e.g., usual fasting morning value, usual evening value)     Unsure 4. KETONES: "Do you check for ketones (urine or blood test strips)?" If Yes, ask: "What does the test show now?"      No 5. TYPE 1 or 2:  "Do you know what type of diabetes you have?"  (e.g., Type 1, Type 2, Gestational; doesn't know)      2 6. INSULIN: "Do you take insulin?" "What type of insulin(s) do you use? What is the mode of delivery? (syringe, pen; injection or pump)?"      Yes 7. DIABETES PILLS: "Do you take any pills for your diabetes?" If Yes, ask: "Have you missed taking any pills recently?"     No missed doses 8. OTHER SYMPTOMS: "Do you have any symptoms?" (e.g., fever, frequent urination, difficulty breathing,  dizziness, weakness, vomiting) Frequent urination  "But drinking a lot of water."  Protocols used: Diabetes - High Blood Sugar-A-AH

## 2022-12-05 NOTE — Telephone Encounter (Signed)
Follow-up from miss call on yesterday-Spoke with patient . Verified name & DOB   Advised patient to continue with the medication instructions given to her in OV with PCP on yesterday. The changes were just made and it well take a little time for her BS to level out. She should avoid sweets,pasta and other food high in carbohydrates, try taking a walk after meal if possible, ensure that she is  hydrated well and continue to keep a Blood Glucose log  with fasting goals of 80-120 mg/dl, random of less than 409 and in the event of sugars less than 60 mg/dl or greater than 811 mg/dl encourage to notify the clinic. Patient voiced the she is only eating once a day and she is under a lot of stress because her mother has been sick. She takes he train to [place] several times a week to see her mother. Patient also work 3rd shift.  Advised to make sure she eating two or more meal with snack a day. This is important when trying to regulate Blood glucose. Patient encouraged to call us if any additional concerns arouse or again for BS < 60 and > 400.  Patient voiced understanding of all discussed

## 2022-12-05 NOTE — Addendum Note (Signed)
Addended by: Elsie Lincoln F on: 12/05/2022 12:15 PM   Modules accepted: Orders

## 2022-12-14 ENCOUNTER — Other Ambulatory Visit: Payer: Self-pay | Admitting: Family Medicine

## 2022-12-14 DIAGNOSIS — Z1231 Encounter for screening mammogram for malignant neoplasm of breast: Secondary | ICD-10-CM

## 2022-12-25 DIAGNOSIS — E119 Type 2 diabetes mellitus without complications: Secondary | ICD-10-CM | POA: Diagnosis not present

## 2023-01-08 ENCOUNTER — Encounter (HOSPITAL_COMMUNITY): Payer: Self-pay

## 2023-01-08 ENCOUNTER — Other Ambulatory Visit (HOSPITAL_COMMUNITY): Payer: Self-pay

## 2023-01-08 ENCOUNTER — Other Ambulatory Visit: Payer: Self-pay

## 2023-01-08 ENCOUNTER — Emergency Department (HOSPITAL_COMMUNITY)
Admission: EM | Admit: 2023-01-08 | Discharge: 2023-01-08 | Disposition: A | Discharge status: Home / Self Care | Payer: BC Managed Care – PPO | Attending: Emergency Medicine | Admitting: Emergency Medicine

## 2023-01-08 DIAGNOSIS — M25512 Pain in left shoulder: Secondary | ICD-10-CM | POA: Diagnosis not present

## 2023-01-08 DIAGNOSIS — Y92410 Unspecified street and highway as the place of occurrence of the external cause: Secondary | ICD-10-CM | POA: Diagnosis not present

## 2023-01-08 DIAGNOSIS — E119 Type 2 diabetes mellitus without complications: Secondary | ICD-10-CM | POA: Diagnosis not present

## 2023-01-08 DIAGNOSIS — Z794 Long term (current) use of insulin: Secondary | ICD-10-CM | POA: Insufficient documentation

## 2023-01-08 DIAGNOSIS — M542 Cervicalgia: Secondary | ICD-10-CM | POA: Diagnosis not present

## 2023-01-08 DIAGNOSIS — M545 Low back pain, unspecified: Secondary | ICD-10-CM | POA: Diagnosis not present

## 2023-01-08 MED ORDER — CYCLOBENZAPRINE HCL 10 MG PO TABS: 10.0000 mg | ORAL_TABLET | Freq: Two times a day (BID) | ORAL | 0 refills | Status: DC | PRN

## 2023-01-08 MED ORDER — CYCLOBENZAPRINE HCL 10 MG PO TABS
10.0000 mg | ORAL_TABLET | Freq: Two times a day (BID) | ORAL | 0 refills | Status: DC | PRN
  Filled 2023-01-08 (×2): qty 20, 10d supply, fill #0

## 2023-01-08 NOTE — ED Notes (Signed)
Pt in bed, pt states that she is ready to go home, pt verbalized understanding d/c and follow up, reviewed prescription, pt ambulatory from dpt.
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
## 2023-01-22 ENCOUNTER — Ambulatory Visit
Admission: RE | Admit: 2023-01-22 | Discharge: 2023-01-22 | Disposition: A | Discharge status: Home / Self Care | Payer: BC Managed Care – PPO | Source: Ambulatory Visit | Attending: Family Medicine | Admitting: Family Medicine

## 2023-01-22 DIAGNOSIS — Z1231 Encounter for screening mammogram for malignant neoplasm of breast: Secondary | ICD-10-CM

## 2023-02-12 ENCOUNTER — Other Ambulatory Visit (HOSPITAL_COMMUNITY): Payer: Self-pay

## 2023-02-13 ENCOUNTER — Other Ambulatory Visit: Payer: Self-pay

## 2023-02-25 ENCOUNTER — Other Ambulatory Visit (HOSPITAL_COMMUNITY): Payer: Self-pay

## 2023-02-25 ENCOUNTER — Other Ambulatory Visit: Payer: Self-pay | Admitting: Family Medicine
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
## 2023-02-26 MED ORDER — CYCLOBENZAPRINE HCL 10 MG PO TABS: 10.0000 mg | ORAL_TABLET | Freq: Two times a day (BID) | ORAL | 0 refills | Status: DC | PRN

## 2023-02-26 NOTE — Telephone Encounter (Signed)
Requested medication (s) are due for refill today:Yes  Requested medication (s) are on the active medication list: Yes  Last refill:  01/08/23  Future visit scheduled: No  Notes to clinic:  Unable to refill per protocol, last refill by another provider.      Requested Prescriptions  Pending Prescriptions Disp Refills   cyclobenzaprine (FLEXERIL) 10 MG tablet 20 tablet 0    Sig: Take 1 tablet (10 mg total) by mouth 2 (two) times daily as needed for muscle spasms.     Not Delegated - Analgesics:  Muscle Relaxants Failed - 02/25/2023  3:24 PM      Failed - This refill cannot be delegated      Passed - Valid encounter within last 6 months    Recent Outpatient Visits           2 months ago Type 2 diabetes mellitus with other neurologic complication, with long-term current use of insulin (HCC)   [place] [facility] [place], [place], MD   6 months ago Hypoglycemia associated with type 2 diabetes mellitus [facility])   [place] [facility] Marcine Matar, MD   9 months ago Need for immunization against influenza   [facility] Hoy Register, MD   1 year ago Annual physical exam   [place] [facility] [place], Odette Horns, MD   1 year ago Type 2 diabetes mellitus with other neurologic complication, with long-term current use of insulin [facility])   [place] [facility] Hoy Register, MD                 com

## 2023-03-18 ENCOUNTER — Other Ambulatory Visit (HOSPITAL_COMMUNITY): Payer: Self-pay

## 2023-03-18 MED FILL — Atorvastatin Calcium Tab 20 MG (Base Equivalent): ORAL | 30 days supply | Qty: 30 | Fill #0 | Status: AC

## 2023-03-19 ENCOUNTER — Other Ambulatory Visit (HOSPITAL_COMMUNITY): Payer: Self-pay

## 2023-03-20 ENCOUNTER — Other Ambulatory Visit: Payer: Self-pay | Admitting: Family Medicine

## 2023-03-20 ENCOUNTER — Other Ambulatory Visit (HOSPITAL_COMMUNITY): Payer: Self-pay

## 2023-03-20 DIAGNOSIS — E785 Hyperlipidemia, unspecified: Secondary | ICD-10-CM

## 2023-03-20 NOTE — Telephone Encounter (Signed)
Medication Refill - Medication: atorvastatin (LIPITOR) 20 MG tablet   Has the patient contacted their pharmacy? Yes.    Preferred Pharmacy (with phone number or street name):  [facility] [phone] - [place], [place] - [address] Phone: [phone]  Fax: [phone]     Has the patient been seen for an appointment in the last year OR does the patient have an upcoming appointment? Yes.    Agent: Please be advised that RX refills may take up to 3 business days. We ask that you follow-up with your pharmacy.

## 2023-03-21 MED ORDER — ATORVASTATIN CALCIUM 20 MG PO TABS
20.0000 mg | ORAL_TABLET | Freq: Every day | ORAL | 0 refills | Status: DC
Start: 2023-03-21 — End: 2023-12-19

## 2023-03-21 NOTE — Telephone Encounter (Signed)
Requested Prescriptions  Pending Prescriptions Disp Refills   atorvastatin (LIPITOR) 20 MG tablet 90 tablet 0    Sig: Take 1 tablet (20 mg total) by mouth daily at 6 PM.     Cardiovascular:  Antilipid - Statins Failed - 03/20/2023  2:25 PM      Failed - Lipid Panel in normal range within the last 12 months    Cholesterol, Total  Date Value Ref Range Status  12/03/2022 173 100 - 199 mg/dL Final   LDL Chol Calc (NIH)  Date Value Ref Range Status  12/03/2022 90 0 - 99 mg/dL Final   HDL  Date Value Ref Range Status  12/03/2022 66 >39 mg/dL Final   Triglycerides  Date Value Ref Range Status  12/03/2022 94 0 - 149 mg/dL Final         Passed - Patient is not pregnant      Passed - Valid encounter within last 12 months    Recent Outpatient Visits           3 months ago Type 2 diabetes mellitus with other neurologic complication, with long-term current use of insulin (HCC)   [place] [facility] [place], [place], MD   6 months ago Hypoglycemia associated with type 2 diabetes mellitus [facility])   [place] [facility] Marcine Matar, MD   9 months ago Need for immunization against influenza   [facility] Hoy Register, MD   1 year ago Annual physical exam   [facility] [place], Odette Horns, MD   1 year ago Type 2 diabetes mellitus with other neurologic complication, with long-term current use of insulin [facility])    [facility] Hoy Register, MD

## 2023-04-06 ENCOUNTER — Telehealth: Payer: BC Managed Care – PPO | Admitting: Nurse Practitioner

## 2023-04-06 ENCOUNTER — Encounter: Payer: Self-pay | Admitting: Family Medicine

## 2023-04-06 DIAGNOSIS — U071 COVID-19: Secondary | ICD-10-CM

## 2023-04-06 MED ORDER — PROMETHAZINE-DM 6.25-15 MG/5ML PO SYRP: 5.0000 mL | ORAL_SOLUTION | Freq: Four times a day (QID) | ORAL | 0 refills | Status: DC | PRN

## 2023-04-06 MED ORDER — NIRMATRELVIR/RITONAVIR (PAXLOVID)TABLET: 3.0000 | ORAL_TABLET | Freq: Two times a day (BID) | ORAL | 0 refills | Status: AC

## 2023-04-06 NOTE — Patient Instructions (Signed)
Krista Campbell, thank you for joining [name], FNP for today's virtual visit.  While this provider is not your primary care provider (PCP), if your PCP is located in our provider database this encounter information will be shared with them immediately following your visit.   A [facility] MyChart account gives you access to today's visit and all your visits, tests, and labs performed at [facility] " click here if you don't have a [facility] MyChart account or go to [email or web address]  Consent: (Patient) Krista Campbell provided verbal consent for this virtual visit at the beginning of the encounter.  Current Medications:  Current Outpatient Medications:    nirmatrelvir/ritonavir (PAXLOVID) 20 x 150 MG & 10 x 100MG  TABS, Take 3 tablets by mouth 2 (two) times daily for 5 days. (Take nirmatrelvir 150 mg two tablets twice daily for 5 days and ritonavir 100 mg one tablet twice daily for 5 days) Patient GFR is 78, Disp: 30 tablet, Rfl: 0   promethazine-dextromethorphan (PROMETHAZINE-DM) 6.25-15 MG/5ML syrup, Take 5 mLs by mouth 4 (four) times daily as needed for cough., Disp: 118 mL, Rfl: 0   atorvastatin (LIPITOR) 20 MG tablet, Take 1 tablet (20 mg total) by mouth daily at 6 PM., Disp: 90 tablet, Rfl: 0   Bayer Microlet Lancets lancets, Use to check blood sugar 3 times daily as directed. E11.9. Z79.4, Disp: 100 each, Rfl: 11   Blood Glucose Monitoring Suppl (CONTOUR BLOOD GLUCOSE SYSTEM) DEVI, Use to check blood sugar 3 times daily as directed. E11.9. Z79.4, Disp: 1 each, Rfl: 0   cyclobenzaprine (FLEXERIL) 10 MG tablet, Take 1 tablet (10 mg total) by mouth 2 (two) times daily as needed for muscle spasms., Disp: 20 tablet, Rfl: 0   glucose blood test strip, Use to check blood sugar 3 times daily as directed. E11.9. Z79.4, Disp: 100 each, Rfl: 12   insulin glargine-yfgn (SEMGLEE) 100 UNIT/ML Pen, Inject 25 Units into the skin daily., Disp: 30 mL, Rfl: 6   Insulin  Pen Needle (TRUEPLUS PEN NEEDLES) 32G X 4 MM MISC, USE AS DIRECTED, Disp: 100 each, Rfl: 11   Insulin Syringe-Needle U-100 (BD INSULIN SYRINGE ULTRAFINE) 31G X 15/64" 0.5 ML MISC, USE AS DIRECTED, Disp: 100 each, Rfl: 12   Magnesium 400 MG TABS, Take 400 mg by mouth 2 (two) times daily. (Patient not taking: Reported on 01/23/2022), Disp: 60 tablet, Rfl: 2   naproxen (NAPROSYN) 500 MG tablet, Take 1 tablet (500 mg total) by mouth 2 (two) times daily with a meal. (Patient not taking: Reported on 01/23/2022), Disp: 30 tablet, Rfl: 3   Semaglutide, 2 MG/DOSE, 8 MG/3ML SOPN, Inject 2 mg as directed once a week., Disp: 3 mL, Rfl: 6  Current Facility-Administered Medications:    dextrose 5 % solution, , Intravenous, Continuous, Armbruster, Willaim Rayas, MD   Medications ordered in this encounter:  Meds ordered this encounter  Medications   nirmatrelvir/ritonavir (PAXLOVID) 20 x 150 MG & 10 x 100MG  TABS    Sig: Take 3 tablets by mouth 2 (two) times daily for 5 days. (Take nirmatrelvir 150 mg two tablets twice daily for 5 days and ritonavir 100 mg one tablet twice daily for 5 days) Patient GFR is 78    Dispense:  30 tablet    Refill:  0    Order Specific Question:   Supervising Provider    Answer:   Merrilee Jansky [[id number]]   promethazine-dextromethorphan (PROMETHAZINE-DM) 6.25-15 MG/5ML syrup    Sig: Take 5 mLs  by mouth 4 (four) times daily as needed for cough.    Dispense:  118 mL    Refill:  0    Order Specific Question:   Supervising Provider    Answer:   Merrilee Jansky [[id number]]     *If you need refills on other medications prior to your next appointment, please contact your pharmacy*  Follow-Up: Call back or seek an in-person evaluation if the symptoms worsen or if the condition fails to improve as anticipated.  [facility] [phone]  Other Instructions 1. Take meds as prescribed 2. Use a cool mist humidifier especially during the winter months and when heat has been  humid. 3. Use saline nose sprays frequently 4. Saline irrigations of the nose can be very helpful if done frequently.  * 4X daily for 1 week*  * Use of a nettie pot can be helpful with this. Follow directions with this* 5. Drink plenty of fluids 6. Keep thermostat turn down low 7.For any cough or congestion- promethazine DM 8. For fever or aces or pains- take tylenol or ibuprofen appropriate for age and weight.  * for fevers greater than 101 orally you may alternate ibuprofen and tylenol every  3 hours.      If you have been instructed to have an in-person evaluation today at a local Urgent Care facility, please use the link below. It will take you to a list of all of our available [facility], including address, phone number and hours of operation. Please do not delay care.  [facility]  If you or a family member do not have a primary care provider, use the link below to schedule a visit and establish care. When you choose a [facility] primary care physician or advanced practice provider, you gain a long-term partner in health. Find a Primary Care Provider  Learn more about [facility] in-office and virtual care options:  - Get Care Now

## 2023-04-06 NOTE — Progress Notes (Signed)
**Note De-Identified Krista Obfuscation** Virtual Visit Consent   Krista Campbell, you are scheduled for a virtual visit with Mary-Margaret Daphine Deutscher, FNP, a [facility] provider, today.     Just as with appointments in the office, your consent must be obtained to participate.  Your consent will be active for this visit and any virtual visit you may have with one of our providers in the next 365 days.     If you have a MyChart account, a copy of this consent can be sent to you electronically.  All virtual visits are billed to your insurance company just like a traditional visit in the office.    As this is a virtual visit, video technology does not allow for your provider to perform a traditional examination.  This may limit your provider's ability to fully assess your condition.  If your provider identifies any concerns that need to be evaluated in person or the need to arrange testing (such as labs, EKG, etc.), we will make arrangements to do so.     Although advances in technology are sophisticated, we cannot ensure that it will always work on either your end or our end.  If the connection with a video visit is poor, the visit may have to be switched to a telephone visit.  With either a video or telephone visit, we are not always able to ensure that we have a secure connection.     I need to obtain your verbal consent now.   Are you willing to proceed with your visit today? YES   Krista Campbell has provided verbal consent on 04/06/2023 for a virtual visit (video or telephone).   Mary-Margaret Daphine Deutscher, FNP   Date: 04/06/2023 12:55 PM   Virtual Visit Krista Video Note   I, Mary-Margaret Daphine Deutscher, connected with Krista Campbell ([id number], 1971-08-05) on 04/06/23 at  1:15 PM EDT by a video-enabled telemedicine application and verified that I am speaking with the correct person using two identifiers.  Location: Patient: Virtual Visit Location Patient: Home Provider: Virtual Visit Location Provider: Mobile   I discussed the  limitations of evaluation and management by telemedicine and the availability of in person appointments. The patient expressed understanding and agreed to proceed.    History of Present Illness: Krista Campbell is a 52 y.o. who identifies as a female who was assigned female at birth, and is being seen today for medication.  HPI: URI  This is a new problem. The current episode started yesterday. The problem has been waxing and waning. There has been no fever. Associated symptoms include congestion, coughing, rhinorrhea and a sore throat. Pertinent negatives include no headaches. She has tried nothing for the symptoms. The treatment provided no relief.   Tested positive for covid this morning Review of Systems  HENT:  Positive for congestion, rhinorrhea and sore throat.   Respiratory:  Positive for cough.   Neurological:  Negative for headaches.    Problems:  Patient Active Problem List   Diagnosis Date Noted   Obesity (BMI 35.0-39.9 without comorbidity) 03/24/2018   HLD (hyperlipidemia) 05/21/2014   Hypomagnesemia 05/21/2014   DKA (diabetic ketoacidoses) 05/19/2014   Hyponatremia 05/19/2014   Hypokalemia 05/19/2014   DM (diabetes mellitus) (HCC) 05/19/2014   Dehydration 05/19/2014    Allergies:  Allergies  Allergen Reactions   Metformin And Related Diarrhea   Medications:  Current Outpatient Medications:    atorvastatin (LIPITOR) 20 MG tablet, Take 1 tablet (20 mg total) by mouth daily at 6 PM., Disp: 90  tablet, Rfl: 0   Bayer Microlet Lancets lancets, Use to check blood sugar 3 times daily as directed. E11.9. Z79.4, Disp: 100 each, Rfl: 11   Blood Glucose Monitoring Suppl (CONTOUR BLOOD GLUCOSE SYSTEM) Krista, Use to check blood sugar 3 times daily as directed. E11.9. Z79.4, Disp: 1 each, Rfl: 0   cyclobenzaprine (FLEXERIL) 10 MG tablet, Take 1 tablet (10 mg total) by mouth 2 (two) times daily as needed for muscle spasms., Disp: 20 tablet, Rfl: 0   glucose blood test strip, Use  to check blood sugar 3 times daily as directed. E11.9. Z79.4, Disp: 100 each, Rfl: 12   insulin glargine-yfgn (SEMGLEE) 100 UNIT/ML Pen, Inject 25 Units into the skin daily., Disp: 30 mL, Rfl: 6   Insulin Pen Needle (TRUEPLUS PEN NEEDLES) 32G X 4 MM MISC, USE AS DIRECTED, Disp: 100 each, Rfl: 11   Insulin Syringe-Needle U-100 (BD INSULIN SYRINGE ULTRAFINE) 31G X 15/64" 0.5 ML MISC, USE AS DIRECTED, Disp: 100 each, Rfl: 12   Magnesium 400 MG TABS, Take 400 mg by mouth 2 (two) times daily. (Patient not taking: Reported on 01/23/2022), Disp: 60 tablet, Rfl: 2   naproxen (NAPROSYN) 500 MG tablet, Take 1 tablet (500 mg total) by mouth 2 (two) times daily with a meal. (Patient not taking: Reported on 01/23/2022), Disp: 30 tablet, Rfl: 3   Semaglutide, 2 MG/DOSE, 8 MG/3ML SOPN, Inject 2 mg as directed once a week., Disp: 3 mL, Rfl: 6  Current Facility-Administered Medications:    dextrose 5 % solution, , Intravenous, Continuous, Armbruster, Willaim Rayas, MD  Observations/Objective: Patient is well-developed, well-nourished in no acute distress.  Resting comfortably  at home.  Head is normocephalic, atraumatic.  No labored breathing.  Speech is clear and coherent with logical content.  Patient is alert and oriented at baseline.  Raspy voice Wet cough noted  Assessment and Plan:  MIYONNA TRINDLE in today with chief complaint of Medication Problem   1. Positive self-administered antigen test for COVID-19 1. Take meds as prescribed 2. Use a cool mist humidifier especially during the winter months and when heat has been humid. 3. Use saline nose sprays frequently 4. Saline irrigations of the nose can be very helpful if done frequently.  * 4X daily for 1 week*  * Use of a nettie pot can be helpful with this. Follow directions with this* 5. Drink plenty of fluids 6. Keep thermostat turn down low 7.For any cough or congestion- promethazine DM 8. For fever or aces or pains- take tylenol or ibuprofen  appropriate for age and weight.  * for fevers greater than 101 orally you may alternate ibuprofen and tylenol every  3 hours.    Meds ordered this encounter  Medications   nirmatrelvir/ritonavir (PAXLOVID) 20 x 150 MG & 10 x 100MG  TABS    Sig: Take 3 tablets by mouth 2 (two) times daily for 5 days. (Take nirmatrelvir 150 mg two tablets twice daily for 5 days and ritonavir 100 mg one tablet twice daily for 5 days) Patient GFR is 78    Dispense:  30 tablet    Refill:  0    Order Specific Question:   Supervising Provider    Answer:   Merrilee Jansky [[id number]]   promethazine-dextromethorphan (PROMETHAZINE-DM) 6.25-15 MG/5ML syrup    Sig: Take 5 mLs by mouth 4 (four) times daily as needed for cough.    Dispense:  118 mL    Refill:  0    Order Specific Question:  Supervising Provider    Answer:   Merrilee Jansky [[id number]]         Follow Up Instructions: I discussed the assessment and treatment plan with the patient. The patient was provided an opportunity to ask questions and all were answered. The patient agreed with the plan and demonstrated an understanding of the instructions.  A copy of instructions were sent to the patient Krista MyChart.  The patient was advised to call back or seek an in-person evaluation if the symptoms worsen or if the condition fails to improve as anticipated.  Time:  I spent 7 minutes with the patient Krista telehealth technology discussing the above problems/concerns.    Mary-Margaret Daphine Deutscher, FNP

## 2023-05-14 ENCOUNTER — Other Ambulatory Visit: Payer: Self-pay | Admitting: Family Medicine

## 2023-05-14 DIAGNOSIS — E1149 Type 2 diabetes mellitus with other diabetic neurological complication: Secondary | ICD-10-CM

## 2023-05-14 NOTE — Telephone Encounter (Signed)
Medication Refill - Medication: insulin glargine-yfgn (SEMGLEE) 100 UNIT/ML Pen   Has the patient contacted their pharmacy? Yes.     Preferred Pharmacy (with phone number or street name):  [facility] [phone] - [place],  - [address] Phone: [phone]  Fax: [phone]      Has the patient been seen for an appointment in the last year OR does the patient have an upcoming appointment? Yes.     The patient states this was last called in the [employer] at [facility] and she has never used it and needs this called into her pharmacy listed. Please assist patient further

## 2023-05-14 NOTE — Telephone Encounter (Signed)
Requested medication (s) are due for refill today: Yes  Requested medication (s) are on the active medication list: Yes  Last refill:  12/03/22  Future visit scheduled: No  Notes to clinic:  Unable to refill per protocol, medication not assigned to the refill protocol.      Requested Prescriptions  Pending Prescriptions Disp Refills   insulin glargine-yfgn (SEMGLEE) 100 UNIT/ML Pen 30 mL 6    Sig: Inject 25 Units into the skin daily.     Off-Protocol Failed - 05/14/2023 11:29 AM      Failed - Medication not assigned to a protocol, review manually.      Passed - Valid encounter within last 12 months    Recent Outpatient Visits           5 months ago Type 2 diabetes mellitus with other neurologic complication, with long-term current use of insulin (HCC)   [facility] [place], [place], MD   8 months ago Hypoglycemia associated with type 2 diabetes mellitus [facility])   [place] [facility] Marcine Matar, MD   11 months ago Need for immunization against influenza   [facility] [place] [facility] Hoy Register, MD   1 year ago Annual physical exam   [place] [facility] Blue, Odette Horns, MD   1 year ago Type 2 diabetes mellitus with other neurologic complication, with long-term current use of insulin [facility])   [place] [facility] Hoy Register, MD

## 2023-05-15 ENCOUNTER — Other Ambulatory Visit: Payer: Self-pay

## 2023-05-15 ENCOUNTER — Other Ambulatory Visit (HOSPITAL_COMMUNITY): Payer: Self-pay

## 2023-05-15 MED ORDER — INSULIN GLARGINE-YFGN 100 UNIT/ML ~~LOC~~ SOPN
25.0000 [IU] | PEN_INJECTOR | Freq: Every day | SUBCUTANEOUS | 0 refills | Status: DC
Start: 2023-05-15 — End: 2023-07-08

## 2023-05-15 MED ORDER — CYCLOBENZAPRINE HCL 10 MG PO TABS: 10.0000 mg | ORAL_TABLET | Freq: Two times a day (BID) | ORAL | 0 refills | Status: DC | PRN

## 2023-05-21 ENCOUNTER — Other Ambulatory Visit (HOSPITAL_COMMUNITY): Payer: Self-pay

## 2023-05-27 ENCOUNTER — Other Ambulatory Visit: Payer: Self-pay

## 2023-05-31 ENCOUNTER — Other Ambulatory Visit (HOSPITAL_COMMUNITY): Payer: Self-pay

## 2023-06-29 ENCOUNTER — Other Ambulatory Visit: Payer: Self-pay | Admitting: Family Medicine

## 2023-07-01 ENCOUNTER — Other Ambulatory Visit (HOSPITAL_COMMUNITY): Payer: Self-pay

## 2023-07-01 MED ORDER — ATORVASTATIN CALCIUM 20 MG PO TABS
20.0000 mg | ORAL_TABLET | Freq: Every day | ORAL | 1 refills | Status: DC
  Filled 2023-07-01: qty 30, 30d supply, fill #0

## 2023-07-08 ENCOUNTER — Other Ambulatory Visit: Payer: Self-pay | Admitting: Family Medicine

## 2023-07-08 DIAGNOSIS — E1149 Type 2 diabetes mellitus with other diabetic neurological complication: Secondary | ICD-10-CM

## 2023-07-08 MED ORDER — INSULIN GLARGINE-YFGN 100 UNIT/ML ~~LOC~~ SOPN
25.0000 [IU] | PEN_INJECTOR | Freq: Every day | SUBCUTANEOUS | 0 refills | Status: DC
Start: 2023-07-08 — End: 2023-10-25

## 2023-09-17 ENCOUNTER — Other Ambulatory Visit (HOSPITAL_COMMUNITY): Payer: Self-pay

## 2023-09-23 ENCOUNTER — Other Ambulatory Visit (HOSPITAL_COMMUNITY): Payer: Self-pay

## 2023-10-23 ENCOUNTER — Other Ambulatory Visit: Payer: Self-pay | Admitting: Family Medicine

## 2023-10-23 ENCOUNTER — Other Ambulatory Visit (HOSPITAL_COMMUNITY): Payer: Self-pay

## 2023-10-23 DIAGNOSIS — Z794 Long term (current) use of insulin: Secondary | ICD-10-CM

## 2023-10-25 ENCOUNTER — Other Ambulatory Visit: Payer: Self-pay

## 2023-10-25 ENCOUNTER — Other Ambulatory Visit (HOSPITAL_COMMUNITY): Payer: Self-pay

## 2023-10-25 ENCOUNTER — Other Ambulatory Visit: Payer: Self-pay | Admitting: Family Medicine

## 2023-10-25 DIAGNOSIS — Z794 Long term (current) use of insulin: Secondary | ICD-10-CM

## 2023-10-25 MED ORDER — INSULIN GLARGINE-YFGN 100 UNIT/ML ~~LOC~~ SOPN
25.0000 [IU] | PEN_INJECTOR | Freq: Every day | SUBCUTANEOUS | 0 refills | Status: DC
Start: 2023-10-25 — End: 2023-12-03
  Filled 2023-10-25: qty 6, 24d supply, fill #0

## 2023-11-04 ENCOUNTER — Other Ambulatory Visit (HOSPITAL_COMMUNITY): Payer: Self-pay

## 2023-11-04 ENCOUNTER — Other Ambulatory Visit: Payer: Self-pay

## 2023-12-03 ENCOUNTER — Ambulatory Visit (HOSPITAL_COMMUNITY)
Admission: RE | Admit: 2023-12-03 | Discharge: 2023-12-03 | Disposition: A | Discharge status: Home / Self Care | Source: Ambulatory Visit | Attending: Internal Medicine | Admitting: Internal Medicine

## 2023-12-03 ENCOUNTER — Other Ambulatory Visit (HOSPITAL_COMMUNITY): Payer: Self-pay

## 2023-12-03 ENCOUNTER — Encounter (HOSPITAL_COMMUNITY): Payer: Self-pay

## 2023-12-03 VITALS — BP 120/77 | HR 77 | Temp 98.5°F | Resp 18

## 2023-12-03 DIAGNOSIS — N760 Acute vaginitis: Secondary | ICD-10-CM | POA: Insufficient documentation

## 2023-12-03 DIAGNOSIS — Z794 Long term (current) use of insulin: Secondary | ICD-10-CM | POA: Diagnosis not present

## 2023-12-03 DIAGNOSIS — Z760 Encounter for issue of repeat prescription: Secondary | ICD-10-CM | POA: Insufficient documentation

## 2023-12-03 DIAGNOSIS — E11628 Type 2 diabetes mellitus with other skin complications: Secondary | ICD-10-CM | POA: Insufficient documentation

## 2023-12-03 DIAGNOSIS — E1149 Type 2 diabetes mellitus with other diabetic neurological complication: Secondary | ICD-10-CM | POA: Diagnosis not present

## 2023-12-03 MED ORDER — INSULIN GLARGINE-YFGN 100 UNIT/ML ~~LOC~~ SOPN
25.0000 [IU] | PEN_INJECTOR | Freq: Every day | SUBCUTANEOUS | 0 refills | Status: DC
  Filled 2023-12-03: qty 6, 24d supply, fill #0

## 2023-12-03 MED ORDER — FLUCONAZOLE 150 MG PO TABS
150.0000 mg | ORAL_TABLET | ORAL | 0 refills | Status: DC
  Filled 2023-12-03: qty 2, 6d supply, fill #0

## 2023-12-03 NOTE — Discharge Instructions (Signed)
 I suspect that your  symptoms are due to suspected vaginal yeast infetion.  Take medication as prescribed.  Your swab has been sent for testing, staff will call you in 2-3 days if you test positive for any other infections and will provide treatment at that time.  Return to urgent care as needed and follow-up with your primary care provider for further evaluation and management of your symptoms..  I hope you feel better!

## 2023-12-03 NOTE — ED Provider Notes (Signed)
 [facility]    CSN: [id number] Arrival date & time: 12/03/23  1310      History   Chief Complaint Chief Complaint  Patient presents with   Vaginal Itching    there is some irritation (red)no abnormal dischargeno smellno burning while urinating - Entered by patient    HPI Krista Campbell is a 53 y.o. female.   Krista Campbell is a 53 y.o. female presenting for chief complaint of vaginal itching that started a few days ago.  She is a type II diabetic on insulin  and states her sugars have been in the 130s to 140s (she is not sure if this is fasting or nonfasting).  Denies vaginal discharge, odor, and rash. No recent antibiotic/steroid use.  Denies nausea, vomiting, urinary symptoms, dizziness, headache, fever, chills, flank pain, and bodyaches.  She used over-the-counter antifungal cream to the vaginal region with some relief initially but states this is not helping anymore. She is requesting refill of her Semglee  insulin  as she has not been able to see her primary care provider to get this refilled.  Reports adequate water  intake and denies changes in urine color.     Vaginal Itching    Past Medical History:  Diagnosis Date   Allergy    mild   Diabetes mellitus without complication (HCC)    GERD (gastroesophageal reflux disease)    Hyperlipidemia     Patient Active Problem List   Diagnosis Date Noted   Obesity (BMI 35.0-39.9 without comorbidity) 03/24/2018   HLD (hyperlipidemia) 05/21/2014   Hypomagnesemia 05/21/2014   DKA (diabetic ketoacidosis) (HCC) 05/19/2014   Hyponatremia 05/19/2014   Hypokalemia 05/19/2014   DM (diabetes mellitus) (HCC) 05/19/2014   Dehydration 05/19/2014    History reviewed. No pertinent surgical history.  OB History   No obstetric history on file.      Home Medications    Prior to Admission medications   Medication Sig Start Date End Date Taking? Authorizing Provider  fluconazole  (DIFLUCAN ) 150 MG tablet Take 1  tablet (150 mg total) by mouth every 3 (three) days. 12/03/23  Yes Starlene Eaton, FNP  atorvastatin  (LIPITOR) 20 MG tablet Take 1 tablet (20 mg total) by mouth daily at 6 PM. 03/21/23   Joaquin Mulberry, MD  atorvastatin  (LIPITOR) 20 MG tablet Take 1 tablet (20 mg total) by mouth daily at 6 PM. 03/21/23   Joaquin Mulberry, MD  Bayer Microlet Lancets lancets Use to check blood sugar 3 times daily as directed. E11.9. Z79.4 07/31/19   Joaquin Mulberry, MD  Blood Glucose Monitoring Suppl (CONTOUR BLOOD GLUCOSE SYSTEM) DEVI Use to check blood sugar 3 times daily as directed. E11.9. Z79.4 07/31/19   Newlin, Enobong, MD  cyclobenzaprine  (FLEXERIL ) 10 MG tablet Take 1 tablet (10 mg total) by mouth 2 (two) times daily as needed for muscle spasms. 05/15/23   Newlin, Enobong, MD  glucose blood test strip Use to check blood sugar 3 times daily as directed. E11.9. Z79.4 07/31/19   Newlin, Enobong, MD  insulin  glargine-yfgn (SEMGLEE , YFGN,) 100 UNIT/ML Pen Inject 25 Units into the skin daily. Please schedule PCP appt with Dr. Newlin! 12/03/23   Starlene Eaton, FNP  Insulin  Pen Needle (TRUEPLUS PEN NEEDLES) 32G X 4 MM MISC USE AS DIRECTED 12/15/20   Newlin, Enobong, MD  Insulin  Syringe-Needle U-100 (BD INSULIN  SYRINGE ULTRAFINE) 31G X 15/64" 0.5 ML MISC USE AS DIRECTED 10/04/16   Newlin, Enobong, MD  Magnesium  400 MG TABS Take 400 mg by mouth 2 (  two) times daily. Patient not taking: Reported on 01/23/2022 09/25/18   Newlin, Enobong, MD  naproxen  (NAPROSYN ) 500 MG tablet Take 1 tablet (500 mg total) by mouth 2 (two) times daily with a meal. Patient not taking: Reported on 01/23/2022 10/11/21   Newlin, Enobong, MD  promethazine -dextromethorphan (PROMETHAZINE -DM) 6.25-15 MG/5ML syrup Take 5 mLs by mouth 4 (four) times daily as needed for cough. 04/06/23   Gaylyn Keas, Mary-Margaret, FNP  Semaglutide , 2 MG/DOSE, 8 MG/3ML SOPN Inject 2 mg as directed once a week. 12/03/22   Joaquin Mulberry, MD    Family History Family History   Problem Relation Age of Onset   Kidney disease Father    Breast cancer Paternal Aunt    Colon cancer Neg Hx    Colon polyps Neg Hx    Esophageal cancer Neg Hx    Rectal cancer Neg Hx    Stomach cancer Neg Hx     Social History Social History   Tobacco Use   Smoking status: Never   Smokeless tobacco: Never  Vaping Use   Vaping status: Never Used  Substance Use Topics   Alcohol use: No   Drug use: No     Allergies   Metformin  and related   Review of Systems Review of Systems Per HPI  Physical Exam Triage Vital Signs ED Triage Vitals  Encounter Vitals Group     BP 12/03/23 1348 120/77     Systolic BP Percentile --      Diastolic BP Percentile --      Pulse Rate 12/03/23 1348 77     Resp 12/03/23 1348 18     Temp 12/03/23 1348 98.5 F (36.9 C)     Temp Source 12/03/23 1348 Oral     SpO2 12/03/23 1348 96 %     Weight --      Height --      Head Circumference --      Peak Flow --      Pain Score 12/03/23 1349 0     Pain Loc --      Pain Education --      Exclude from Growth Chart --    No data found.  Updated Vital Signs BP 120/77 (BP Location: Left Arm)   Pulse 77   Temp 98.5 F (36.9 C) (Oral)   Resp 18   SpO2 96%   Visual Acuity Right Eye Distance:   Left Eye Distance:   Bilateral Distance:    Right Eye Near:   Left Eye Near:    Bilateral Near:     Physical Exam Vitals and nursing note reviewed.  Constitutional:      Appearance: She is not ill-appearing or toxic-appearing.  HENT:     Head: Normocephalic and atraumatic.     Right Ear: Hearing and external ear normal.     Left Ear: Hearing and external ear normal.     Nose: Nose normal.     Mouth/Throat:     Lips: Pink.  Eyes:     General: Lids are normal. Vision grossly intact. Gaze aligned appropriately.     Extraocular Movements: Extraocular movements intact.     Conjunctiva/sclera: Conjunctivae normal.  Pulmonary:     Effort: Pulmonary effort is normal.  Genitourinary:     Comments: Deferred. Musculoskeletal:     Cervical back: Neck supple.  Skin:    General: Skin is warm and dry.     Capillary Refill: Capillary refill takes less than 2 seconds.  Findings: No rash.  Neurological:     General: No focal deficit present.     Mental Status: She is alert and oriented to person, place, and time. Mental status is at baseline.     Cranial Nerves: No dysarthria or facial asymmetry.  Psychiatric:        Mood and Affect: Mood normal.        Speech: Speech normal.        Behavior: Behavior normal.        Thought Content: Thought content normal.        Judgment: Judgment normal.      UC Treatments / Results  Labs (all labs ordered are listed, but only abnormal results are displayed) Labs Reviewed  CERVICOVAGINAL ANCILLARY ONLY    EKG   Radiology No results found.  Procedures Procedures (including critical care time)  Medications Ordered in UC Medications - No data to display  Initial Impression / Assessment and Plan / UC Course  I have reviewed the triage vital signs and the nursing notes.  Pertinent labs & imaging results that were available during my care of the patient were reviewed by me and considered in my medical decision making (see chart for details).   1. Acute vaginitis, type 2 diabetes with other skin complication with long-term use of insulin , medication refill High clinical suspicion for yeast vaginitis, therefore Diflucan  ordered for empiric treatment. Cervicovaginal ancillary ordered to confirm BV/yeast etiology. Patient requests refill of Semglee  insulin , this has been ordered.  Advised to follow-up with PCP for ongoing evaluation. I am unable to see a recent hemoglobin A1c in the system, however she states her sugars have been within good range in the 130s to 140s. Advised to follow-up with Dr. Newlin in the next 7 days.   Counseled patient on potential for adverse effects with medications prescribed/recommended today, strict  ER and return-to-clinic precautions discussed, patient verbalized understanding.   Final Clinical Impressions(s) / UC Diagnoses   Final diagnoses:  Acute vaginitis  Type 2 diabetes mellitus with other skin complication, with long-term current use of insulin  (HCC)  Medication refill     Discharge Instructions      I suspect that your  symptoms are due to suspected vaginal yeast infetion.  Take medication as prescribed.  Your swab has been sent for testing, staff will call you in 2-3 days if you test positive for any other infections and will provide treatment at that time.  Return to urgent care as needed and follow-up with your primary care provider for further evaluation and management of your symptoms..  I hope you feel better!       ED Prescriptions     Medication Sig Dispense Auth. Provider   insulin  glargine-yfgn (SEMGLEE , YFGN,) 100 UNIT/ML Pen Inject 25 Units into the skin daily. Please schedule PCP appt with Dr. Newlin! 9 mL Harlo Ligas, Danny Dye, FNP   fluconazole  (DIFLUCAN ) 150 MG tablet Take 1 tablet (150 mg total) by mouth every 3 (three) days. 2 tablet Starlene Eaton, FNP      PDMP not reviewed this encounter.   Starlene Eaton, [place] 12/03/23 1454

## 2023-12-03 NOTE — ED Triage Notes (Signed)
 Pt c/p vaginal itching x3 days. Denies any other sx's.

## 2023-12-04 ENCOUNTER — Telehealth (HOSPITAL_COMMUNITY): Payer: Self-pay

## 2023-12-04 ENCOUNTER — Other Ambulatory Visit (HOSPITAL_COMMUNITY): Payer: Self-pay

## 2023-12-04 LAB — CERVICOVAGINAL ANCILLARY ONLY
Bacterial Vaginitis (gardnerella): POSITIVE — AB
Candida Glabrata: NEGATIVE
Candida Vaginitis: NEGATIVE
Comment: NEGATIVE
Comment: NEGATIVE
Comment: NEGATIVE

## 2023-12-04 MED ORDER — METRONIDAZOLE 500 MG PO TABS
500.0000 mg | ORAL_TABLET | Freq: Two times a day (BID) | ORAL | 0 refills | Status: AC
Start: 2023-12-04 — End: 2023-12-12
  Filled 2023-12-04: qty 14, 7d supply, fill #0

## 2023-12-04 NOTE — Telephone Encounter (Signed)
 Per protocol, pt requires tx with metronidazole. Reviewed with patient, verified pharmacy, prescription sent.

## 2023-12-05 ENCOUNTER — Other Ambulatory Visit (HOSPITAL_COMMUNITY): Payer: Self-pay

## 2023-12-05 ENCOUNTER — Other Ambulatory Visit: Payer: Self-pay | Admitting: Pharmacist

## 2023-12-05 NOTE — Progress Notes (Signed)
 Patient was identified as falling into the [place] Measure - Diabetes per [facility] report.   Patient was: Patient is not currently using our practice.  Will route to PharmD, CPP for Dr. Jennet Mode office for follow up as needed   Appreciate collaboration,  Ragan Reale Dattero Calvin Jablonowski, PharmD, BCACP, CPP Clinical Pharmacist, [facility]

## 2023-12-09 ENCOUNTER — Other Ambulatory Visit: Payer: Self-pay | Admitting: Family Medicine

## 2023-12-09 DIAGNOSIS — Z1231 Encounter for screening mammogram for malignant neoplasm of breast: Secondary | ICD-10-CM

## 2023-12-13 ENCOUNTER — Telehealth: Payer: Self-pay

## 2023-12-13 NOTE — Telephone Encounter (Signed)
 Patient was identified as falling into the [place] Measure - Diabetes.   Patient was: Appointment already scheduled for:  12/19/2023.

## 2023-12-19 ENCOUNTER — Ambulatory Visit: Attending: Internal Medicine | Admitting: Internal Medicine

## 2023-12-19 ENCOUNTER — Encounter: Payer: Self-pay | Admitting: Internal Medicine

## 2023-12-19 ENCOUNTER — Other Ambulatory Visit (HOSPITAL_COMMUNITY): Payer: Self-pay

## 2023-12-19 VITALS — BP 95/65 | HR 77 | Temp 98.1°F | Ht 61.0 in | Wt 165.0 lb

## 2023-12-19 DIAGNOSIS — E785 Hyperlipidemia, unspecified: Secondary | ICD-10-CM | POA: Diagnosis not present

## 2023-12-19 DIAGNOSIS — K047 Periapical abscess without sinus: Secondary | ICD-10-CM

## 2023-12-19 DIAGNOSIS — Z7985 Long-term (current) use of injectable non-insulin antidiabetic drugs: Secondary | ICD-10-CM | POA: Diagnosis not present

## 2023-12-19 DIAGNOSIS — E119 Type 2 diabetes mellitus without complications: Secondary | ICD-10-CM | POA: Diagnosis not present

## 2023-12-19 DIAGNOSIS — Z794 Long term (current) use of insulin: Secondary | ICD-10-CM | POA: Diagnosis not present

## 2023-12-19 DIAGNOSIS — E669 Obesity, unspecified: Secondary | ICD-10-CM | POA: Diagnosis not present

## 2023-12-19 DIAGNOSIS — E1169 Type 2 diabetes mellitus with other specified complication: Secondary | ICD-10-CM | POA: Diagnosis not present

## 2023-12-19 DIAGNOSIS — Z6831 Body mass index (BMI) 31.0-31.9, adult: Secondary | ICD-10-CM

## 2023-12-19 LAB — POCT GLYCOSYLATED HEMOGLOBIN (HGB A1C): HbA1c, POC (controlled diabetic range): 6.1 % (ref 0.0–7.0)

## 2023-12-19 LAB — GLUCOSE, POCT (MANUAL RESULT ENTRY): POC Glucose: 75 mg/dL (ref 70–99)

## 2023-12-19 MED ORDER — CYCLOBENZAPRINE HCL 10 MG PO TABS
10.0000 mg | ORAL_TABLET | Freq: Two times a day (BID) | ORAL | 0 refills | Status: DC | PRN
  Filled 2023-12-19 – 2024-02-06 (×3): qty 20, 10d supply, fill #0

## 2023-12-19 MED ORDER — ATORVASTATIN CALCIUM 20 MG PO TABS
20.0000 mg | ORAL_TABLET | Freq: Every day | ORAL | 1 refills | Status: DC
Start: 2023-12-19 — End: 2024-04-23
  Filled 2023-12-19 – 2024-02-06 (×3): qty 30, 30d supply, fill #0

## 2023-12-19 MED ORDER — SEMAGLUTIDE (2 MG/DOSE) 8 MG/3ML ~~LOC~~ SOPN
2.0000 mg | PEN_INJECTOR | SUBCUTANEOUS | 6 refills | Status: AC
  Filled 2023-12-19 – 2024-02-06 (×3): qty 3, 28d supply, fill #0
  Filled 2024-04-14: qty 3, 28d supply, fill #1
  Filled 2024-07-08: qty 3, 28d supply, fill #2
  Filled 2024-08-18: qty 3, 28d supply, fill #3

## 2023-12-19 MED ORDER — INSULIN GLARGINE-YFGN 100 UNIT/ML ~~LOC~~ SOPN
25.0000 [IU] | PEN_INJECTOR | Freq: Every day | SUBCUTANEOUS | 1 refills | Status: DC
  Filled 2023-12-19 – 2024-02-06 (×3): qty 6, 24d supply, fill #0
  Filled 2024-04-06 – 2024-04-14 (×6): qty 6, 24d supply, fill #1

## 2023-12-19 NOTE — Patient Instructions (Signed)
 VISIT SUMMARY:  You came in today for a follow-up visit to manage your diabetes and address some dental issues. Your diabetes is well-controlled with your current medications, and your A1c has significantly improved. We also discussed your dental problems and the need for further treatment.  YOUR PLAN:  -DENTAL ABSCESS WITH CAVITY: A dental abscess is an infection at the root of a tooth or between the gum and a tooth, often causing pain and swelling. You have a suspected abscess in your lower left first molar. You should take amoxicillin to treat the infection and see a dentist for further evaluation and treatment. In the meantime, stick to soft foods and liquids to avoid aggravating the area.  -TYPE 2 DIABETES MELLITUS: Type 2 diabetes is a condition where your body does not use insulin  properly, leading to high blood sugar levels. Your diabetes is well-controlled with your current medications, Semglee  insulin  and Ozempic . Continue taking Seemgleeinsulin 25 units daily and Ozempic  2 mg weekly. We will also order a diabetic eye exam and blood work to check your cholesterol, kidney, and liver function, as well as urine protein levels. Your atorvastatin  prescription will be refilled.  INSTRUCTIONS:  Please schedule a diabetic eye exam and follow up with your dentist for the dental abscess. Continue monitoring your blood sugar levels and take your medications as prescribed. We will also need to do some blood work to check your overall health.

## 2023-12-19 NOTE — Progress Notes (Signed)
 Patient ID: Krista Campbell, female    DOB: 1971-03-15  MRN: [id number]  CC: Follow-up ([facility] f/u. Med refill. Alois Arnt if sx resolved )   Subjective: Krista Campbell is a 53 y.o. female who presents for chronic ds management. PCP is Dr. Newlin whom she last saw about 1 yr ago Her concerns today include:  Patient with history of diabetes, HL, obesity   Discussed the use of AI scribe software for clinical note transcription with the patient, who gave verbal consent to proceed.  History of Present Illness Krista Campbell is a 53 year old female with diabetes who presents for a follow-up visit.   DM Results for orders placed or performed in visit on 12/19/23  POCT glucose (manual entry)   Collection Time: 12/19/23  2:38 PM  Result Value Ref Range   POC Glucose 75 70 - 99 mg/dl  POCT glycosylated hemoglobin (Hb A1C)   Collection Time: 12/19/23  2:45 PM  Result Value Ref Range   Hemoglobin A1C     HbA1c POC (<> result, manual entry)     HbA1c, POC (prediabetic range)     HbA1c, POC (controlled diabetic range) 6.1 0.0 - 7.0 %    She is on Semglee  insulin  25 units daily and Ozempic  2 mg weekly. Her A1c has improved from 14 to 6.1, and her current blood sugar is 75. She monitors her blood sugar approximately three times a week and can detect hypoglycemia, which is infrequent. No frequent hypoglycemic episodes, vomiting, or diarrhea from Ozempic .  She has lost about 15 pounds since being on Ozempic . Overdue for diabetic eye exam.  HL: Reports taking atorvastatin  20 mg daily.  Dental issues, including a broken tooth and possible gum abscess, have been present for over a month, leading to poor eating habits. She was prescribed amoxicillin by dentist whom she saw but did not take it as the swelling that she was having at that time subsided. These issues cause a salty or unpleasant taste, affecting her appetite .      Patient Active Problem List   Diagnosis Date Noted   Obesity  (BMI 35.0-39.9 without comorbidity) 03/24/2018   HLD (hyperlipidemia) 05/21/2014   Hypomagnesemia 05/21/2014   DKA (diabetic ketoacidosis) (HCC) 05/19/2014   Hyponatremia 05/19/2014   Hypokalemia 05/19/2014   DM (diabetes mellitus) (HCC) 05/19/2014   Dehydration 05/19/2014     Current Outpatient Medications on File Prior to Visit  Medication Sig Dispense Refill   Bayer Microlet Lancets lancets Use to check blood sugar 3 times daily as directed. E11.9. Z79.4 100 each 11   Blood Glucose Monitoring Suppl (CONTOUR BLOOD GLUCOSE SYSTEM) DEVI Use to check blood sugar 3 times daily as directed. E11.9. Z79.4 1 each 0   glucose blood test strip Use to check blood sugar 3 times daily as directed. E11.9. Z79.4 100 each 12   Insulin  Pen Needle (TRUEPLUS PEN NEEDLES) 32G X 4 MM MISC USE AS DIRECTED 100 each 11   Insulin  Syringe-Needle U-100 (BD INSULIN  SYRINGE ULTRAFINE) 31G X 15/64" 0.5 ML MISC USE AS DIRECTED 100 each 12   No current facility-administered medications on file prior to visit.    Allergies  Allergen Reactions   Metformin  And Related Diarrhea    Social History   Socioeconomic History   Marital status: Significant Other    Spouse name: Not on file   Number of children: Not on file   Years of education: Not on file   Highest education level: Associate  degree: occupational, technical, or vocational program  Occupational History   Not on file  Tobacco Use   Smoking status: Never   Smokeless tobacco: Never  Vaping Use   Vaping status: Never Used  Substance and Sexual Activity   Alcohol use: No   Drug use: No   Sexual activity: Not on file  Other Topics Concern   Not on file  Social History Narrative   Not on file   Social Drivers of Health   Financial Resource Strain: Medium Risk (12/18/2023)   Overall Financial Resource Strain (CARDIA)    Difficulty of Paying Living Expenses: Somewhat hard  Food Insecurity: Food Insecurity Present (12/18/2023)   Hunger Vital Sign     Worried About Running Out of Food in the Last Year: Never true    Ran Out of Food in the Last Year: Sometimes true  Transportation Needs: Unmet Transportation Needs (12/18/2023)   PRAPARE - Administrator, Civil Service (Medical): Yes    Lack of Transportation (Non-Medical): Not on file  Physical Activity: Insufficiently Active (12/18/2023)   Exercise Vital Sign    Days of Exercise per Week: 2 days    Minutes of Exercise per Session: 10 min  Stress: No Stress Concern Present (12/18/2023)   [employer] of Occupational Health - Occupational Stress Questionnaire    Feeling of Stress : Only a little  Social Connections: Unknown (12/18/2023)   Social Connection and Isolation Panel [NHANES]    Frequency of Communication with Friends and Family: Once a week    Frequency of Social Gatherings with Friends and Family: Once a week    Attends Religious Services: More than 4 times per year    Active Member of [employer] or Organizations: Yes    Attends Banker Meetings: 1 to 4 times per year    Marital Status: Patient declined  Catering manager Violence: Not on file    Family History  Problem Relation Age of Onset   Kidney disease Father    Breast cancer Paternal Aunt    Colon cancer Neg Hx    Colon polyps Neg Hx    Esophageal cancer Neg Hx    Rectal cancer Neg Hx    Stomach cancer Neg Hx     No past surgical history on file.  ROS: Review of Systems Negative except as stated above  PHYSICAL EXAM: BP 95/65 (BP Location: Left Arm, Patient Position: Sitting, Cuff Size: Normal)   Pulse 77   Temp 98.1 F (36.7 C) (Oral)   Ht 5\' 1"  (1.549 m)   Wt 165 lb (74.8 kg)   SpO2 99%   BMI 31.18 kg/m   Wt Readings from Last 3 Encounters:  12/19/23 165 lb (74.8 kg)  01/08/23 180 lb (81.6 kg)  12/03/22 170 lb 6.4 oz (77.3 kg)    Physical Exam  General appearance - alert, well appearing, middle-age African-American female and in no distress Mental status - normal mood,  behavior, speech, dress, motor activity, and thought processes Mouth -patient has a cavity in the first molar left lower jaw.  Surrounding gum is inflamed and tender to touch.   Neck - supple, no significant adenopathy Chest - clear to auscultation, no wheezes, rales or rhonchi, symmetric air entry Heart - normal rate, regular rhythm, normal S1, S2, no murmurs, rubs, clicks or gallops Extremities - peripheral pulses normal, no pedal edema, no clubbing or cyanosis      Latest Ref Rng & Units 12/03/2022   11:58 AM  05/31/2022    9:22 AM 10/16/2021    8:35 AM  CMP  Glucose 70 - 99 mg/dL 865  784  65   BUN 6 - 24 mg/dL 12  11  13    Creatinine 0.57 - 1.00 mg/dL 6.96  2.95  2.84   Sodium 134 - 144 mmol/L 137  138  136   Potassium 3.5 - 5.2 mmol/L 3.5  3.5  3.9   Chloride 96 - 106 mmol/L 93  97  98   CO2 20 - 29 mmol/L 26  27  24    Calcium  8.7 - 10.2 mg/dL 9.8  9.7  9.8   Total Protein 6.0 - 8.5 g/dL 7.3  7.7  7.3   Total Bilirubin 0.0 - 1.2 mg/dL 0.8  0.5  0.5   Alkaline Phos 44 - 121 IU/L 134  107  141   AST 0 - 40 IU/L 30  22  29    ALT 0 - 32 IU/L 28  18  25     Lipid Panel     Component Value Date/Time   CHOL 173 12/03/2022 1158   TRIG 94 12/03/2022 1158   HDL 66 12/03/2022 1158   CHOLHDL 3.8 12/05/2020 1202   CHOLHDL 2.2 10/19/2015 1001   VLDL 9 10/19/2015 1001   LDLCALC 90 12/03/2022 1158    CBC    Component Value Date/Time   WBC 7.5 10/16/2021 0835   WBC 4.9 10/19/2015 1001   RBC 4.05 10/16/2021 0835   RBC 4.17 10/19/2015 1001   HGB 11.3 10/16/2021 0835   HCT 35.5 10/16/2021 0835   PLT 286 10/16/2021 0835   MCV 88 10/16/2021 0835   MCH 27.9 10/16/2021 0835   MCH 28.3 10/19/2015 1001   MCHC 31.8 10/16/2021 0835   MCHC 32.2 10/19/2015 1001   RDW 13.5 10/16/2021 0835   LYMPHSABS 3.1 10/16/2021 0835   MONOABS 0.7 05/19/2014 1155   EOSABS 0.2 10/16/2021 0835   BASOSABS 0.0 10/16/2021 0835    ASSESSMENT AND PLAN: 1. Type 2 diabetes mellitus with obesity (HCC)  (Primary) At goal.  Continue Semglee  25 units daily and Ozempic .  Encourage healthy eating habits. - POCT glycosylated hemoglobin (Hb A1C) - POCT glucose (manual entry) - insulin  glargine-yfgn (SEMGLEE , YFGN,) 100 UNIT/ML Pen; Inject 25 Units into the skin daily.  Dispense: 9 mL; Refill: 1 - Semaglutide , 2 MG/DOSE, 8 MG/3ML SOPN; Inject 2 mg as directed once a week.  Dispense: 3 mL; Refill: 6 - CBC - Comprehensive metabolic panel with GFR - Microalbumin / creatinine urine ratio - Ambulatory referral to Ophthalmology  2. Insulin  long-term use (HCC) 3. Long-term (current) use of injectable non-insulin  antidiabetic drugs See #1 above.  4. Dental infection Advised patient to take the amoxicillin that was prescribed by the dentist.  Advised that she also call and schedule a follow-up appointment with the dentist.  5. Hyperlipidemia associated with type 2 diabetes mellitus (HCC) - atorvastatin  (LIPITOR) 20 MG tablet; Take 1 tablet (20 mg total) by mouth daily at 6 PM.  Dispense: 90 tablet; Refill: 1 - Lipid panel   Patient was given the opportunity to ask questions.  Patient verbalized understanding of the plan and was able to repeat key elements of the plan.   This documentation was completed using Paediatric nurse.  Any transcriptional errors are unintentional.  Orders Placed This Encounter  Procedures   CBC   Comprehensive metabolic panel with GFR   Lipid panel   Microalbumin / creatinine urine ratio  Ambulatory referral to Ophthalmology   POCT glycosylated hemoglobin (Hb A1C)   POCT glucose (manual entry)     Requested Prescriptions   Signed Prescriptions Disp Refills   insulin  glargine-yfgn (SEMGLEE , YFGN,) 100 UNIT/ML Pen 9 mL 1    Sig: Inject 25 Units into the skin daily.   atorvastatin  (LIPITOR) 20 MG tablet 90 tablet 1    Sig: Take 1 tablet (20 mg total) by mouth daily at 6 PM.   cyclobenzaprine  (FLEXERIL ) 10 MG tablet 20 tablet 0    Sig: Take 1  tablet (10 mg total) by mouth 2 (two) times daily as needed for muscle spasms.   Semaglutide , 2 MG/DOSE, 8 MG/3ML SOPN 3 mL 6    Sig: Inject 2 mg as directed once a week.    Return in about 4 months (around 04/20/2024) for give the 4 mth f/u with her PCP Dr. Newlin.  Concetta Dee, MD, FACP

## 2023-12-20 LAB — COMPREHENSIVE METABOLIC PANEL WITH GFR
ALT: 20 IU/L (ref 0–32)
AST: 28 IU/L (ref 0–40)
Albumin: 4.7 g/dL (ref 3.8–4.9)
Alkaline Phosphatase: 96 IU/L (ref 44–121)
BUN/Creatinine Ratio: 13 (ref 9–23)
BUN: 12 mg/dL (ref 6–24)
Bilirubin Total: 0.7 mg/dL (ref 0.0–1.2)
CO2: 26 mmol/L (ref 20–29)
Calcium: 9.9 mg/dL (ref 8.7–10.2)
Chloride: 97 mmol/L (ref 96–106)
Creatinine, Ser: 0.94 mg/dL (ref 0.57–1.00)
Globulin, Total: 2.8 g/dL (ref 1.5–4.5)
Glucose: 132 mg/dL — ABNORMAL HIGH (ref 70–99)
Potassium: 4.2 mmol/L (ref 3.5–5.2)
Sodium: 140 mmol/L (ref 134–144)
Total Protein: 7.5 g/dL (ref 6.0–8.5)
eGFR: 73 mL/min/{1.73_m2} (ref >59)

## 2023-12-20 LAB — LIPID PANEL
Chol/HDL Ratio: 2.9 ratio (ref 0.0–4.4)
Cholesterol, Total: 190 mg/dL (ref 100–199)
HDL: 65 mg/dL (ref >39)
LDL Chol Calc (NIH): 112 mg/dL — ABNORMAL HIGH (ref 0–99)
Triglycerides: 70 mg/dL (ref 0–149)
VLDL Cholesterol Cal: 13 mg/dL (ref 5–40)

## 2023-12-20 LAB — CBC
Hematocrit: 35.2 % (ref 34.0–46.6)
Hemoglobin: 11.8 g/dL (ref 11.1–15.9)
MCH: 28.6 pg (ref 26.6–33.0)
MCHC: 33.5 g/dL (ref 31.5–35.7)
MCV: 85 fL (ref 79–97)
Platelets: 278 10*3/uL (ref 150–450)
RBC: 4.12 x10E6/uL (ref 3.77–5.28)
RDW: 12.8 % (ref 11.7–15.4)
WBC: 5.2 10*3/uL (ref 3.4–10.8)

## 2023-12-20 LAB — MICROALBUMIN / CREATININE URINE RATIO
Creatinine, Urine: 87.1 mg/dL
Microalb/Creat Ratio: 6 mg/g{creat} (ref 0–29)
Microalbumin, Urine: 5.5 ug/mL

## 2023-12-21 ENCOUNTER — Encounter: Payer: Self-pay | Admitting: Internal Medicine

## 2023-12-31 ENCOUNTER — Other Ambulatory Visit (HOSPITAL_COMMUNITY): Payer: Self-pay

## 2024-01-01 ENCOUNTER — Other Ambulatory Visit (HOSPITAL_COMMUNITY): Payer: Self-pay

## 2024-01-13 ENCOUNTER — Other Ambulatory Visit (HOSPITAL_COMMUNITY): Payer: Self-pay

## 2024-01-23 ENCOUNTER — Ambulatory Visit

## 2024-01-24 ENCOUNTER — Ambulatory Visit
Admission: RE | Admit: 2024-01-24 | Discharge: 2024-01-24 | Disposition: A | Discharge status: Home / Self Care | Source: Ambulatory Visit | Attending: Family Medicine | Admitting: Family Medicine

## 2024-01-24 DIAGNOSIS — Z1231 Encounter for screening mammogram for malignant neoplasm of breast: Secondary | ICD-10-CM

## 2024-01-24 IMAGING — MG MM DIGITAL SCREENING BILAT W/ TOMO AND CAD
6 of 12 series · 6 of 36 positions shown · non-contrast
Comparison: None available.

CLINICAL DATA: Screening.

EXAM:
DIGITAL SCREENING BILATERAL MAMMOGRAM WITH TOMOSYNTHESIS AND CAD
TECHNIQUE: Bilateral screening digital craniocaudal and mediolateral oblique
mammograms were obtained. Bilateral screening digital breast
tomosynthesis was performed. The images were evaluated with
computer-aided detection.

[R MLO synth-2D (1 of 2)]
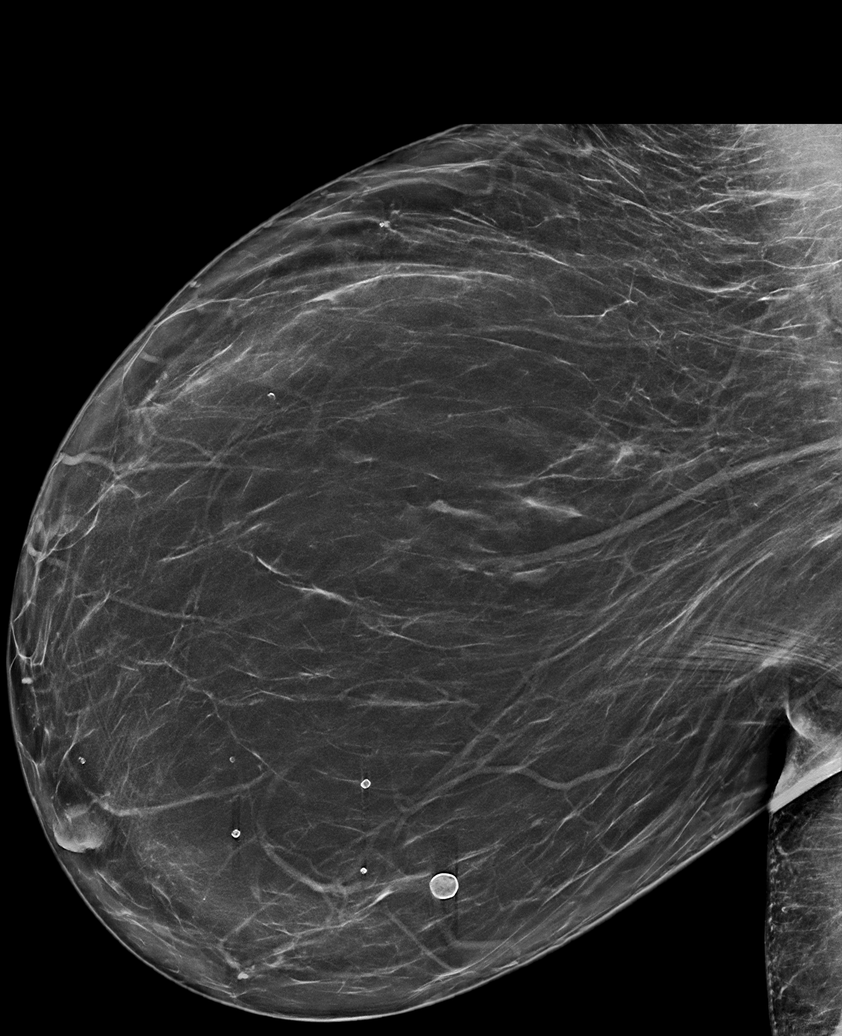

[R MLO synth-2D (2 of 2)]
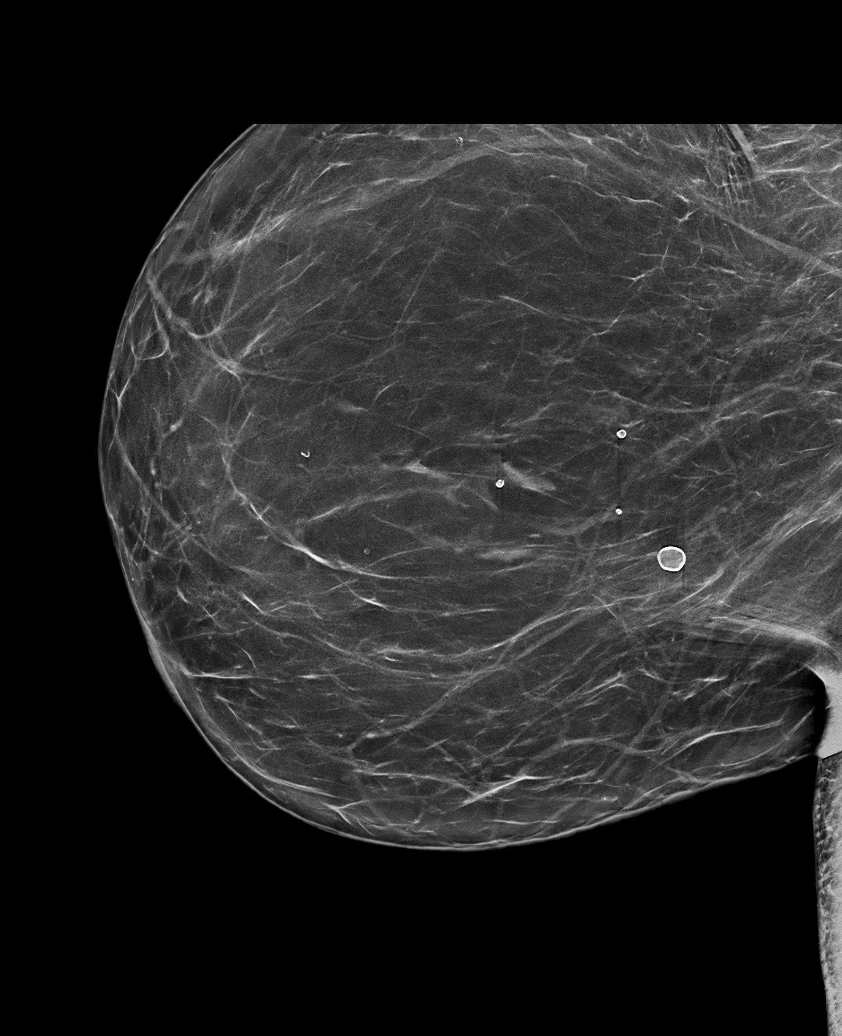

[L CC synth-2D]
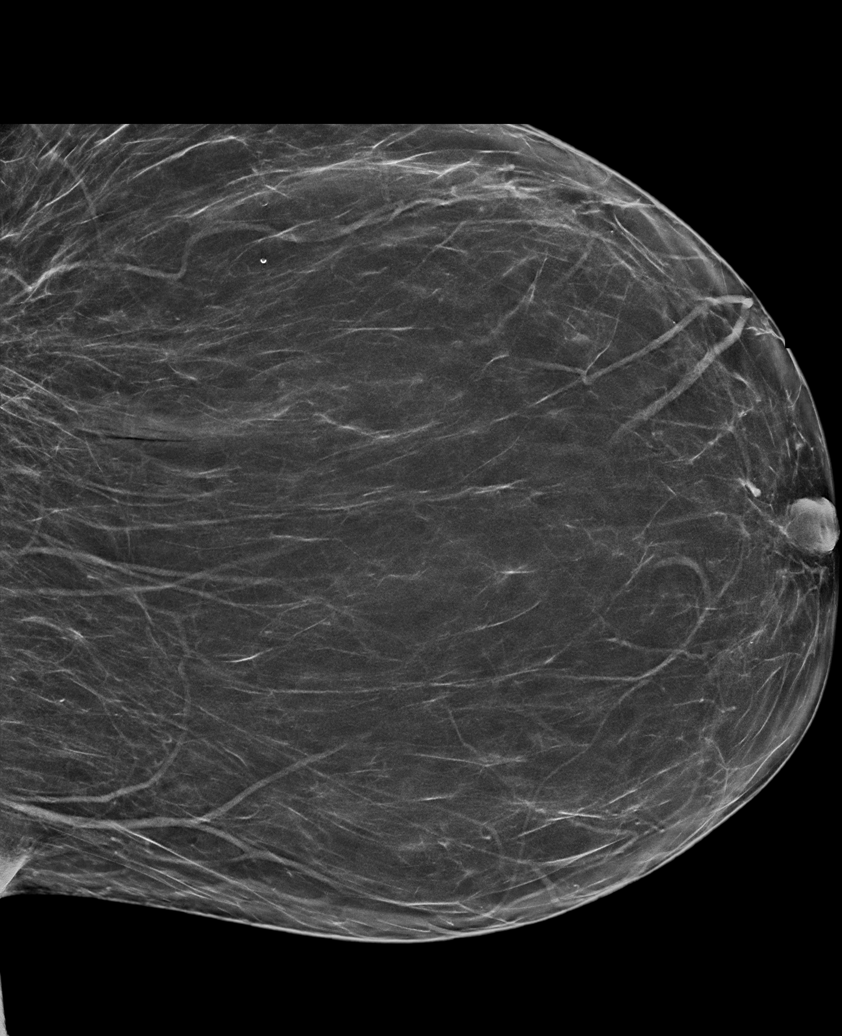

[R CC synth-2D]
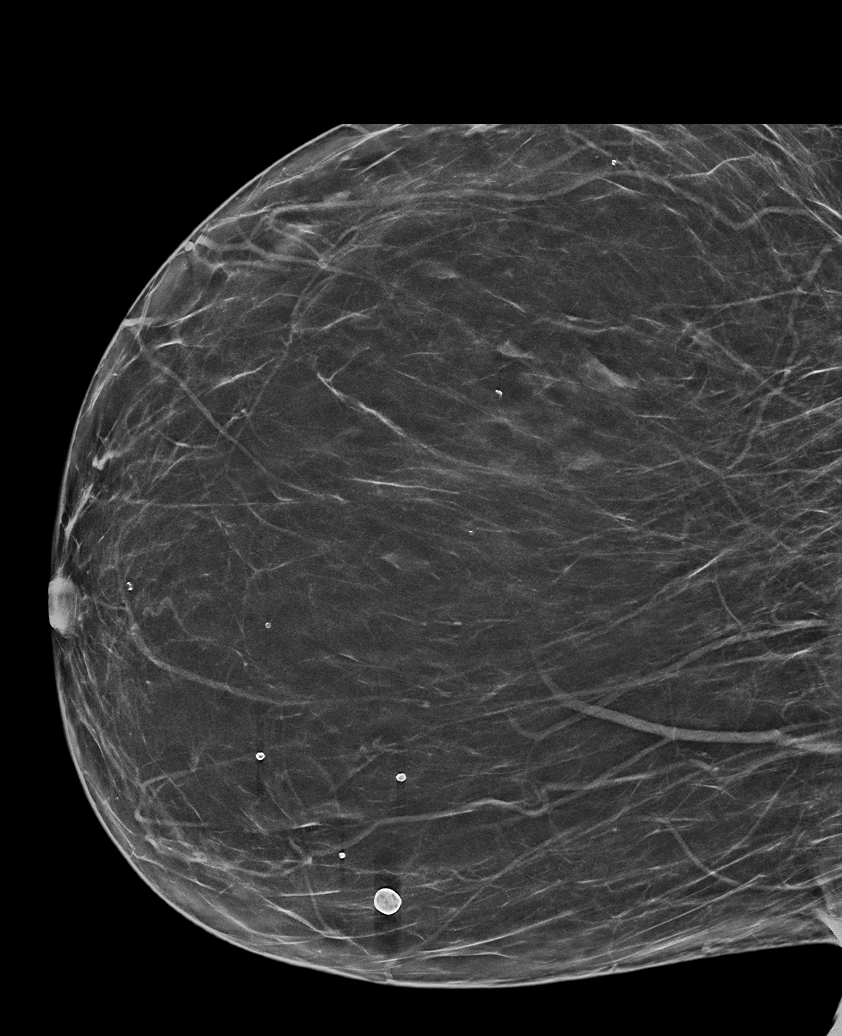

[L MLO synth-2D (1 of 2)]
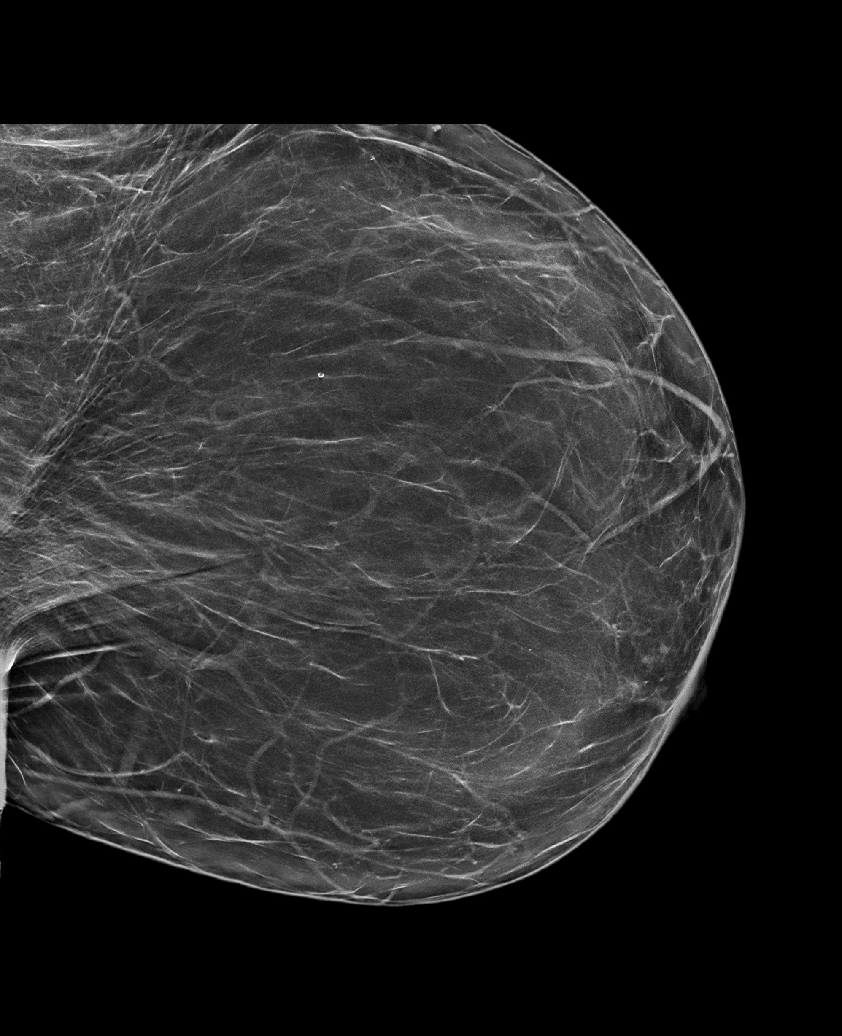

[L MLO synth-2D (2 of 2)]
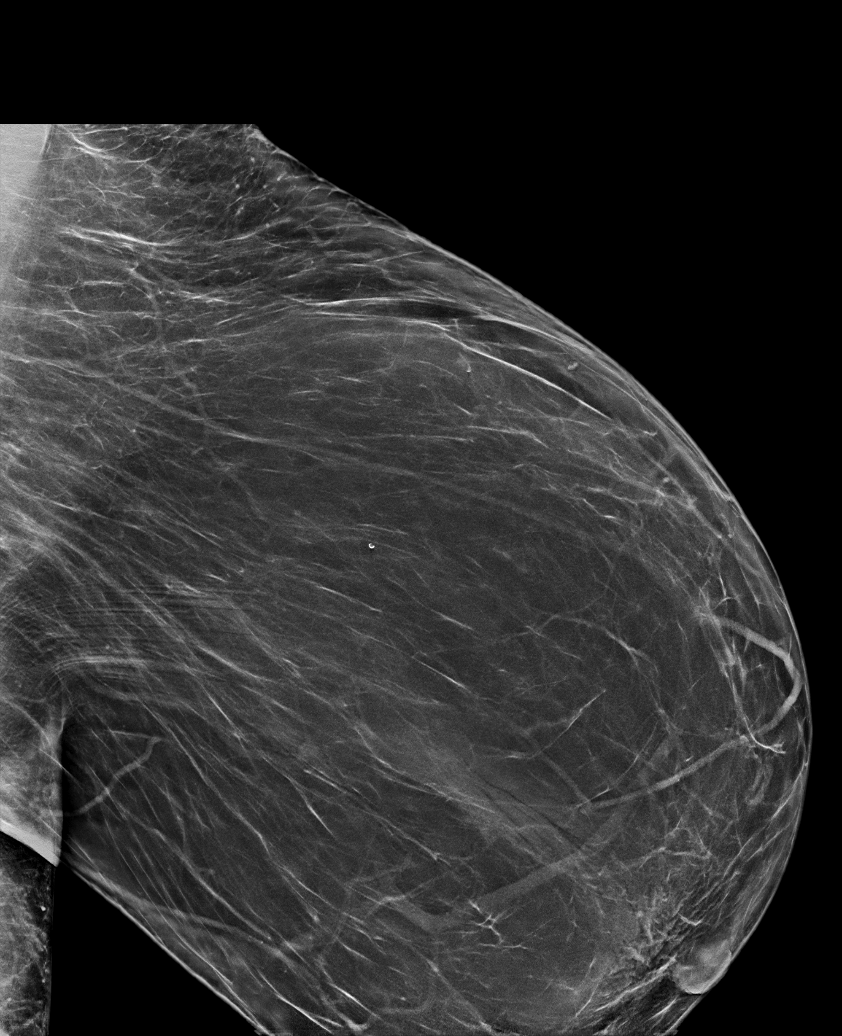

[6 of 36 positions shown; findings below may reference images not displayed]

ACR Breast Density Category b: There are scattered areas of
fibroglandular density.
FINDINGS: There are no findings suspicious for malignancy.
IMPRESSION: No mammographic evidence of malignancy. A result letter of this
screening mammogram will be mailed directly to the patient.

RECOMMENDATION:
Screening mammogram in one year. (Code:GB-Z-FIU)

BI-RADS CATEGORY  1: Negative.

## 2024-01-30 ENCOUNTER — Ambulatory Visit: Payer: Self-pay | Admitting: Family Medicine

## 2024-02-06 ENCOUNTER — Other Ambulatory Visit (HOSPITAL_COMMUNITY): Payer: Self-pay

## 2024-04-06 ENCOUNTER — Other Ambulatory Visit (HOSPITAL_COMMUNITY): Payer: Self-pay

## 2024-04-06 ENCOUNTER — Other Ambulatory Visit: Payer: Self-pay

## 2024-04-08 ENCOUNTER — Other Ambulatory Visit (HOSPITAL_COMMUNITY): Payer: Self-pay

## 2024-04-14 ENCOUNTER — Other Ambulatory Visit (HOSPITAL_COMMUNITY): Payer: Self-pay

## 2024-04-17 ENCOUNTER — Telehealth: Payer: Self-pay | Admitting: Family Medicine

## 2024-04-17 NOTE — Telephone Encounter (Signed)
 Pt confirmed appt 9/5

## 2024-04-20 ENCOUNTER — Other Ambulatory Visit (HOSPITAL_COMMUNITY): Payer: Self-pay

## 2024-04-20 ENCOUNTER — Ambulatory Visit: Attending: Family Medicine | Admitting: Family Medicine

## 2024-04-20 VITALS — BP 103/71 | HR 77 | Ht 61.0 in | Wt 165.4 lb

## 2024-04-20 DIAGNOSIS — Z23 Encounter for immunization: Secondary | ICD-10-CM | POA: Diagnosis not present

## 2024-04-20 DIAGNOSIS — E1169 Type 2 diabetes mellitus with other specified complication: Secondary | ICD-10-CM | POA: Diagnosis not present

## 2024-04-20 DIAGNOSIS — E669 Obesity, unspecified: Secondary | ICD-10-CM | POA: Diagnosis not present

## 2024-04-20 DIAGNOSIS — M545 Low back pain, unspecified: Secondary | ICD-10-CM | POA: Diagnosis not present

## 2024-04-20 DIAGNOSIS — Z794 Long term (current) use of insulin: Secondary | ICD-10-CM

## 2024-04-20 DIAGNOSIS — Z7985 Long-term (current) use of injectable non-insulin antidiabetic drugs: Secondary | ICD-10-CM

## 2024-04-20 DIAGNOSIS — E785 Hyperlipidemia, unspecified: Secondary | ICD-10-CM

## 2024-04-20 LAB — POCT GLYCOSYLATED HEMOGLOBIN (HGB A1C): HbA1c, POC (controlled diabetic range): 6.4 % (ref 0.0–7.0)

## 2024-04-20 MED ORDER — INSULIN GLARGINE-YFGN 100 UNIT/ML ~~LOC~~ SOPN
25.0000 [IU] | PEN_INJECTOR | Freq: Every day | SUBCUTANEOUS | 1 refills | Status: AC
  Filled 2024-04-20: qty 9, 36d supply, fill #0
  Filled 2024-05-05: qty 6, 24d supply, fill #0

## 2024-04-20 MED ORDER — CYCLOBENZAPRINE HCL 10 MG PO TABS
10.0000 mg | ORAL_TABLET | Freq: Two times a day (BID) | ORAL | 2 refills | Status: AC | PRN
  Filled 2024-04-20: qty 60, 30d supply, fill #0

## 2024-04-20 NOTE — Progress Notes (Signed)
 Subjective:  Patient ID: Krista Campbell, female    DOB: 11/15/1970  Age: 53 y.o. MRN: [id number]  CC: Medical Management of Chronic Issues     Discussed the use of AI scribe software for clinical note transcription with the patient, who gave verbal consent to proceed.  History of Present Illness Krista Campbell is a 53 year old female with a history of type 2 diabetes mellitus, hyperlipidemia, obesity who presents with persistent low back pain.  The low back pain is due to 'a collapsing disc' she was told in the past , primarily located on the right side, occasionally radiating to the hip and once to the right leg. Prolonged sitting, especially during her overnight work shifts, exacerbates the pain.  She finds relief through physical therapy exercises (which she has done in the past), hot water , and a vibrating patch similar to a TENS unit. Flexeril  provides significant relief, but she is cautious about running out. She continues exercises independently using bands and a program on her phone. Previous physical therapy sessions, including water  therapy, were beneficial.  She has elevated LDL cholesterol and inconsistently took atorvastatin  prior to her last set of labs 3 months ago but has since restarted it regularly. She manages diabetes with 25 units of Semglee  and 2 mg of Ozempic , though she has faced difficulty obtaining Ozempic  due to high demand.     Past Medical History:  Diagnosis Date   Allergy    mild   Diabetes mellitus without complication (HCC)    GERD (gastroesophageal reflux disease)    Hyperlipidemia     No past surgical history on file.  Family History  Problem Relation Age of Onset   Kidney disease Father    Breast cancer Paternal Aunt    Colon cancer Neg Hx    Colon polyps Neg Hx    Esophageal cancer Neg Hx    Rectal cancer Neg Hx    Stomach cancer Neg Hx     Social History   Socioeconomic History   Marital status: Significant Other    Spouse  name: Not on file   Number of children: Not on file   Years of education: Not on file   Highest education level: Some college, no degree  Occupational History   Not on file  Tobacco Use   Smoking status: Never   Smokeless tobacco: Never  Vaping Use   Vaping status: Never Used  Substance and Sexual Activity   Alcohol use: No   Drug use: No   Sexual activity: Not on file  Other Topics Concern   Not on file  Social History Narrative   Not on file   Social Drivers of Health   Financial Resource Strain: Low Risk  (04/20/2024)   Overall Financial Resource Strain (CARDIA)    Difficulty of Paying Living Expenses: Not very hard  Food Insecurity: Food Insecurity Present (04/20/2024)   Hunger Vital Sign    Worried About Running Out of Food in the Last Year: Sometimes true    Ran Out of Food in the Last Year: Patient declined  Transportation Needs: No Transportation Needs (04/20/2024)   PRAPARE - Administrator, Civil Service (Medical): No    Lack of Transportation (Non-Medical): No  Physical Activity: Insufficiently Active (04/20/2024)   Exercise Vital Sign    Days of Exercise per Week: 2 days    Minutes of Exercise per Session: 20 min  Stress: No Stress Concern Present (04/20/2024)   [employer]  of Occupational Health - Occupational Stress Questionnaire    Feeling of Stress: Not at all  Social Connections: Moderately Integrated (04/20/2024)   Social Connection and Isolation Panel    Frequency of Communication with Friends and Family: More than three times a week    Frequency of Social Gatherings with Friends and Family: Once a week    Attends Religious Services: More than 4 times per year    Active Member of [employer] or Organizations: Yes    Attends Engineer, structural: More than 4 times per year    Marital Status: Never married    Allergies  Allergen Reactions   Metformin  And Related Diarrhea    Outpatient Medications Prior to Visit  Medication Sig Dispense  Refill   atorvastatin  (LIPITOR) 20 MG tablet Take 1 tablet (20 mg total) by mouth daily at 6 PM. 90 tablet 1   Bayer Microlet Lancets lancets Use to check blood sugar 3 times daily as directed. E11.9. Z79.4 100 each 11   Blood Glucose Monitoring Suppl (CONTOUR BLOOD GLUCOSE SYSTEM) DEVI Use to check blood sugar 3 times daily as directed. E11.9. Z79.4 1 each 0   glucose blood test strip Use to check blood sugar 3 times daily as directed. E11.9. Z79.4 100 each 12   Insulin  Pen Needle (TRUEPLUS PEN NEEDLES) 32G X 4 MM MISC USE AS DIRECTED 100 each 11   Insulin  Syringe-Needle U-100 (BD INSULIN  SYRINGE ULTRAFINE) 31G X 15/64 0.5 ML MISC USE AS DIRECTED 100 each 12   Semaglutide , 2 MG/DOSE, 8 MG/3ML SOPN Inject 2 mg as directed once a week. 3 mL 6   cyclobenzaprine  (FLEXERIL ) 10 MG tablet Take 1 tablet (10 mg total) by mouth 2 (two) times daily as needed for muscle spasms. 20 tablet 0   insulin  glargine-yfgn (SEMGLEE , YFGN,) 100 UNIT/ML Pen Inject 25 Units into the skin daily. 9 mL 1   No facility-administered medications prior to visit.     ROS Review of Systems  Constitutional:  Negative for activity change and appetite change.  HENT:  Negative for sinus pressure and sore throat.   Respiratory:  Negative for chest tightness, shortness of breath and wheezing.   Cardiovascular:  Negative for chest pain and palpitations.  Gastrointestinal:  Negative for abdominal distention, abdominal pain and constipation.  Genitourinary: Negative.   Musculoskeletal:  Positive for back pain.  Psychiatric/Behavioral:  Negative for behavioral problems and dysphoric mood.     Objective:  BP 103/71   Pulse 77   Ht 5' 1 (1.549 m)   Wt 165 lb 6.4 oz (75 kg)   SpO2 97%   BMI 31.25 kg/m      04/20/2024    2:05 PM 12/19/2023    2:33 PM 12/03/2023    1:48 PM  BP/Weight  Systolic BP 103 95 120  Diastolic BP 71 65 77  Wt. (Lbs) 165.4 165   BMI 31.25 kg/m2 31.18 kg/m2       Physical Exam Constitutional:       Appearance: She is well-developed.  Cardiovascular:     Rate and Rhythm: Normal rate.     Heart sounds: Normal heart sounds. No murmur heard. Pulmonary:     Effort: Pulmonary effort is normal.     Breath sounds: Normal breath sounds. No wheezing or rales.  Chest:     Chest wall: No tenderness.  Abdominal:     General: Bowel sounds are normal. There is no distension.     Palpations: Abdomen is soft. There  is no mass.     Tenderness: There is no abdominal tenderness.  Musculoskeletal:        General: Normal range of motion.     Right lower leg: No edema.     Left lower leg: No edema.  Neurological:     Mental Status: She is alert and oriented to person, place, and time.  Psychiatric:        Mood and Affect: Mood normal.        Latest Ref Rng & Units 12/19/2023    3:50 PM 12/03/2022   11:58 AM 05/31/2022    9:22 AM  CMP  Glucose 70 - 99 mg/dL 867  691  839   BUN 6 - 24 mg/dL 12  12  11    Creatinine 0.57 - 1.00 mg/dL 9.05  9.03  9.01   Sodium 134 - 144 mmol/L 140  137  138   Potassium 3.5 - 5.2 mmol/L 4.2  3.5  3.5   Chloride 96 - 106 mmol/L 97  93  97   CO2 20 - 29 mmol/L 26  26  27    Calcium  8.7 - 10.2 mg/dL 9.9  9.8  9.7   Total Protein 6.0 - 8.5 g/dL 7.5  7.3  7.7   Total Bilirubin 0.0 - 1.2 mg/dL 0.7  0.8  0.5   Alkaline Phos 44 - 121 IU/L 96  134  107   AST 0 - 40 IU/L 28  30  22    ALT 0 - 32 IU/L 20  28  18      Lipid Panel     Component Value Date/Time   CHOL 190 12/19/2023 1550   TRIG 70 12/19/2023 1550   HDL 65 12/19/2023 1550   CHOLHDL 2.9 12/19/2023 1550   CHOLHDL 2.2 10/19/2015 1001   VLDL 9 10/19/2015 1001   LDLCALC 112 (H) 12/19/2023 1550    CBC    Component Value Date/Time   WBC 5.2 12/19/2023 1550   WBC 4.9 10/19/2015 1001   RBC 4.12 12/19/2023 1550   RBC 4.17 10/19/2015 1001   HGB 11.8 12/19/2023 1550   HCT 35.2 12/19/2023 1550   PLT 278 12/19/2023 1550   MCV 85 12/19/2023 1550   MCH 28.6 12/19/2023 1550   MCH 28.3 10/19/2015 1001    MCHC 33.5 12/19/2023 1550   MCHC 32.2 10/19/2015 1001   RDW 12.8 12/19/2023 1550   LYMPHSABS 3.1 10/16/2021 0835   MONOABS 0.7 05/19/2014 1155   EOSABS 0.2 10/16/2021 0835   BASOSABS 0.0 10/16/2021 0835    Lab Results  Component Value Date   HGBA1C 6.4 04/20/2024       Assessment & Plan Chronic low back pain due to lumbar degenerative disc disease Chronic low back pain on the right side, relieved by physical therapy, hot water , and TENS unit. Flexeril  provides significant relief but is used sparingly. - Refilled Flexeril  prescription with 60 pills as needed. - Referred to aquatic therapy.  Type 2 diabetes mellitus with obesity Type 2 diabetes well-controlled with A1c of 6.4. Sensation in feet intact. Discussed importance of wearing shoes and intact sensation. - Continue Semglee  25 units daily. - Continue Ozempic  2 mg weekly. - Schedule fasting labs to monitor diabetes control.  Mixed hyperlipidemia associated with type 2 diabetes mellitus Mixed hyperlipidemia with elevated LDL at 12 mg/dL. Resumed consistent atorvastatin  use. - Continue atorvastatin  20 mg daily. - Schedule fasting labs to reassess lipid levels.     Healthcare maintenance Need for pneumonia vaccine -  pneumonia vaccine administered Need for influenza vaccine - flu shot administered  Meds ordered this encounter  Medications   cyclobenzaprine  (FLEXERIL ) 10 MG tablet    Sig: Take 1 tablet (10 mg total) by mouth 2 (two) times daily as needed for muscle spasms.    Dispense:  60 tablet    Refill:  2   insulin  glargine-yfgn (SEMGLEE , YFGN,) 100 UNIT/ML Pen    Sig: Inject 25 Units into the skin daily.    Dispense:  9 mL    Refill:  1    Follow-up: Return in about 6 months (around 10/18/2024) for Chronic medical conditions.       Corrina Sabin, MD, FAAFP. [facility] [place], [place] [phone]   04/20/2024, 2:40 PM

## 2024-04-20 NOTE — Addendum Note (Signed)
 Addended by: LONITA FLORETTE GAILS on: 04/20/2024 03:00 PM   Modules accepted: Orders

## 2024-04-20 NOTE — Patient Instructions (Signed)
 VISIT SUMMARY:  Today, you were seen for persistent low back pain, management of type 2 diabetes, and elevated cholesterol levels. We discussed your current symptoms, reviewed your medications, and made some adjustments to your treatment plan.  YOUR PLAN:  -CHRONIC LOW BACK PAIN DUE TO LUMBAR DEGENERATIVE DISC DISEASE: Your low back pain is caused by a collapsing disc in your spine. We have refilled your Flexeril  prescription with 60 pills to use as needed for pain relief. Additionally, you have been referred to aquatic therapy to help manage your symptoms.  -TYPE 2 DIABETES MELLITUS WITH DIABETIC NEUROPATHY: Your diabetes is well-controlled with your current medications, Semglee  and Ozempic . It is important to continue taking these medications as prescribed. We also discussed the importance of wearing shoes to protect your feet. Fasting labs have been scheduled to monitor your diabetes control.  -MIXED HYPERLIPIDEMIA ASSOCIATED WITH TYPE 2 DIABETES MELLITUS: Your cholesterol levels are elevated, which is common in people with diabetes. You should continue taking atorvastatin  20 mg daily to help manage your cholesterol. Fasting labs have been scheduled to reassess your lipid levels.  INSTRUCTIONS:  Please schedule and complete your fasting labs to monitor your diabetes control and reassess your lipid levels. Continue taking your medications as prescribed and attend the aquatic therapy sessions as referred.

## 2024-04-22 ENCOUNTER — Ambulatory Visit: Attending: Family Medicine

## 2024-04-22 DIAGNOSIS — E1169 Type 2 diabetes mellitus with other specified complication: Secondary | ICD-10-CM | POA: Diagnosis not present

## 2024-04-22 DIAGNOSIS — E785 Hyperlipidemia, unspecified: Secondary | ICD-10-CM | POA: Diagnosis not present

## 2024-04-23 ENCOUNTER — Other Ambulatory Visit (HOSPITAL_COMMUNITY): Payer: Self-pay

## 2024-04-23 ENCOUNTER — Ambulatory Visit: Payer: Self-pay | Admitting: Family Medicine

## 2024-04-23 DIAGNOSIS — E1169 Type 2 diabetes mellitus with other specified complication: Secondary | ICD-10-CM

## 2024-04-23 LAB — CMP14+EGFR
ALT: 21 IU/L (ref 0–32)
AST: 25 IU/L (ref 0–40)
Albumin: 4.5 g/dL (ref 3.8–4.9)
Alkaline Phosphatase: 110 IU/L (ref 44–121)
BUN/Creatinine Ratio: 24 — ABNORMAL HIGH (ref 9–23)
BUN: 25 mg/dL — ABNORMAL HIGH (ref 6–24)
Bilirubin Total: 0.7 mg/dL (ref 0.0–1.2)
CO2: 21 mmol/L (ref 20–29)
Calcium: 9.8 mg/dL (ref 8.7–10.2)
Chloride: 101 mmol/L (ref 96–106)
Creatinine, Ser: 1.05 mg/dL — ABNORMAL HIGH (ref 0.57–1.00)
Globulin, Total: 3.1 g/dL (ref 1.5–4.5)
Glucose: 82 mg/dL (ref 70–99)
Potassium: 4 mmol/L (ref 3.5–5.2)
Sodium: 142 mmol/L (ref 134–144)
Total Protein: 7.6 g/dL (ref 6.0–8.5)
eGFR: 64 mL/min/1.73 (ref >59)

## 2024-04-23 LAB — LP+NON-HDL CHOLESTEROL
Cholesterol, Total: 202 mg/dL — ABNORMAL HIGH (ref 100–199)
HDL: 71 mg/dL (ref >39)
LDL Chol Calc (NIH): 116 mg/dL — ABNORMAL HIGH (ref 0–99)
Total Non-HDL-Chol (LDL+VLDL): 131 mg/dL — ABNORMAL HIGH (ref 0–129)
Triglycerides: 84 mg/dL (ref 0–149)
VLDL Cholesterol Cal: 15 mg/dL (ref 5–40)

## 2024-04-23 MED ORDER — ATORVASTATIN CALCIUM 40 MG PO TABS
40.0000 mg | ORAL_TABLET | Freq: Every day | ORAL | 1 refills | Status: AC
  Filled 2024-04-23: qty 30, 30d supply, fill #0

## 2024-05-05 ENCOUNTER — Other Ambulatory Visit: Payer: Self-pay

## 2024-05-12 ENCOUNTER — Encounter (HOSPITAL_COMMUNITY): Payer: Self-pay

## 2024-05-12 ENCOUNTER — Other Ambulatory Visit (HOSPITAL_COMMUNITY): Payer: Self-pay

## 2024-06-05 ENCOUNTER — Other Ambulatory Visit (HOSPITAL_COMMUNITY): Payer: Self-pay

## 2024-06-25 ENCOUNTER — Other Ambulatory Visit: Payer: Self-pay

## 2024-07-08 ENCOUNTER — Other Ambulatory Visit: Payer: Self-pay | Admitting: Family Medicine

## 2024-07-08 ENCOUNTER — Other Ambulatory Visit (HOSPITAL_COMMUNITY): Payer: Self-pay

## 2024-07-08 ENCOUNTER — Other Ambulatory Visit: Payer: Self-pay

## 2024-07-08 MED ORDER — LANTUS SOLOSTAR 100 UNIT/ML ~~LOC~~ SOPN
25.0000 [IU] | PEN_INJECTOR | Freq: Every day | SUBCUTANEOUS | 3 refills | Status: AC
  Filled 2024-07-08: qty 6, 24d supply, fill #0
  Filled 2024-08-18: qty 6, 24d supply, fill #1

## 2024-07-24 ENCOUNTER — Other Ambulatory Visit (HOSPITAL_COMMUNITY): Payer: Self-pay

## 2024-08-14 ENCOUNTER — Other Ambulatory Visit: Payer: Self-pay

## 2024-08-18 ENCOUNTER — Other Ambulatory Visit: Payer: Self-pay | Admitting: Family Medicine

## 2024-08-18 ENCOUNTER — Other Ambulatory Visit (HOSPITAL_COMMUNITY): Payer: Self-pay

## 2024-08-18 MED ORDER — TRUEPLUS PEN NEEDLES 32G X 4 MM MISC
1 refills | Status: AC
  Filled 2024-08-18: qty 100, 30d supply, fill #0

## 2024-08-19 ENCOUNTER — Other Ambulatory Visit (HOSPITAL_COMMUNITY): Payer: Self-pay

## 2024-08-21 ENCOUNTER — Other Ambulatory Visit (HOSPITAL_COMMUNITY): Payer: Self-pay

## 2024-10-19 ENCOUNTER — Ambulatory Visit: Admitting: Family Medicine
